# Patient Record
Sex: Female | Born: 1985 | Race: Black or African American | Hispanic: No | Marital: Single | Smoking: Never smoker
Health system: Southern US, Community
[De-identification: ages and names within clinical notes are randomized; demographics above are authoritative.]

## PROBLEM LIST (undated history)

## (undated) ENCOUNTER — Inpatient Hospital Stay (HOSPITAL_COMMUNITY): Payer: Self-pay

## (undated) DIAGNOSIS — L509 Urticaria, unspecified: Secondary | ICD-10-CM

## (undated) DIAGNOSIS — E559 Vitamin D deficiency, unspecified: Secondary | ICD-10-CM

## (undated) DIAGNOSIS — T783XXA Angioneurotic edema, initial encounter: Secondary | ICD-10-CM

## (undated) DIAGNOSIS — Z8742 Personal history of other diseases of the female genital tract: Secondary | ICD-10-CM

## (undated) DIAGNOSIS — E669 Obesity, unspecified: Secondary | ICD-10-CM

## (undated) DIAGNOSIS — I10 Essential (primary) hypertension: Secondary | ICD-10-CM

## (undated) HISTORY — DX: Urticaria, unspecified: L50.9

## (undated) HISTORY — PX: INDUCED ABORTION: SHX677

## (undated) HISTORY — DX: Obesity, unspecified: E66.9

## (undated) HISTORY — DX: Angioneurotic edema, initial encounter: T78.3XXA

## (undated) HISTORY — DX: Personal history of other diseases of the female genital tract: Z87.42

## (undated) HISTORY — DX: Vitamin D deficiency, unspecified: E55.9

## (undated) HISTORY — DX: Essential (primary) hypertension: I10

---
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
## 2012-01-17 ENCOUNTER — Telehealth: Payer: Self-pay | Admitting: Obstetrics and Gynecology

## 2012-01-17 NOTE — Telephone Encounter (Signed)
Pt called yesterday, states was put on BCPs back in October, has not had a cycle for the past 2 months, and yesterday started with heavy bldg, filled a pad in 20 minutes, 20 minutes later filled another pad, pt then took a 20 minute drive home and the bldg and slowed down some.  Pt was requesting an appt to address bldg and discuss a different contraception.  Informed pt will need consult with someone about appt bc only one provider in the office.  Spoke w/ pt today, states bldg is a lot better, was "freaked out yesterday about the heavy bldg".  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

## 2012-01-17 NOTE — Telephone Encounter (Signed)
Pt called yesterday, states was put on BCPs back in October, has not had a cycle for the past 2 months, and yesterday started with heavy bldg, filled a pad in 20 minutes, 20 minutes later

## 2012-01-17 NOTE — Telephone Encounter (Signed)
Triage/follow up quest.

## 2012-03-11 ENCOUNTER — Telehealth: Payer: Self-pay | Admitting: Obstetrics and Gynecology

## 2012-03-11 NOTE — Telephone Encounter (Signed)
Try calling pt rgd msg number not in service 

## 2012-03-31 ENCOUNTER — Ambulatory Visit (INDEPENDENT_AMBULATORY_CARE_PROVIDER_SITE_OTHER): Payer: BC Managed Care – PPO | Admitting: Obstetrics and Gynecology

## 2012-03-31 ENCOUNTER — Encounter: Payer: Self-pay | Admitting: Obstetrics and Gynecology

## 2012-03-31 VITALS — BP 124/80 | HR 72 | Resp 18 | Ht 68.0 in | Wt 279.0 lb

## 2012-03-31 DIAGNOSIS — IMO0001 Reserved for inherently not codable concepts without codable children: Secondary | ICD-10-CM

## 2012-03-31 DIAGNOSIS — Z139 Encounter for screening, unspecified: Secondary | ICD-10-CM

## 2012-03-31 DIAGNOSIS — N921 Excessive and frequent menstruation with irregular cycle: Secondary | ICD-10-CM

## 2012-03-31 DIAGNOSIS — Z309 Encounter for contraceptive management, unspecified: Secondary | ICD-10-CM

## 2012-03-31 DIAGNOSIS — Z124 Encounter for screening for malignant neoplasm of cervix: Secondary | ICD-10-CM

## 2012-03-31 DIAGNOSIS — Z01419 Encounter for gynecological examination (general) (routine) without abnormal findings: Secondary | ICD-10-CM

## 2012-03-31 LAB — CBC
HCT: 37.6 % (ref 36.0–46.0)
Hemoglobin: 12.5 g/dL (ref 12.0–15.0)
MCH: 28.8 pg (ref 26.0–34.0)
MCHC: 33.2 g/dL (ref 30.0–36.0)
WBC: 8.2 10*3/uL (ref 4.0–10.5)

## 2012-03-31 MED ORDER — NORETHIN-ETH ESTRADIOL-FE 0.4-35 MG-MCG PO CHEW: 1.0000 | CHEWABLE_TABLET | Freq: Every day | ORAL | Status: DC
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
## 2012-03-31 NOTE — Addendum Note (Signed)
Addended by: Marla Roe A on: 03/31/2012 05:32 PM   Modules accepted: Orders

## 2012-04-01 LAB — PAP IG W/ RFLX HPV ASCU

## 2012-04-01 LAB — VITAMIN D 25 HYDROXY (VIT D DEFICIENCY, FRACTURES): Vit D, 25-Hydroxy: 20 ng/mL — ABNORMAL LOW (ref 30–89)

## 2012-04-13 ENCOUNTER — Telehealth: Payer: Self-pay

## 2012-04-13 NOTE — Telephone Encounter (Signed)
[employer] a note asking her to send letter to pt to ask her to cb for test results. Phone for herself and emergency contact have been disconnected. Melody Comas A

## 2012-04-15 ENCOUNTER — Telehealth: Payer: Self-pay

## 2012-04-15 NOTE — Telephone Encounter (Signed)
Attempted again to reach pt re: abnl pap and Vit D levels. She has been sent Elliott letter, but has not contacted [name]. Brittany Elliott

## 2012-04-16 ENCOUNTER — Telehealth: Payer: Self-pay | Admitting: Obstetrics and Gynecology

## 2012-04-16 ENCOUNTER — Telehealth: Payer: Self-pay

## 2012-04-16 ENCOUNTER — Other Ambulatory Visit: Payer: Self-pay

## 2012-04-16 DIAGNOSIS — E559 Vitamin D deficiency, unspecified: Secondary | ICD-10-CM

## 2012-04-16 NOTE — Telephone Encounter (Signed)
LM x 2 for pt to cb re: test results. She has left a new mobile # of [phone]. She needs Vit. D protocol and colpo. I am looking at 06/01/2012 @ 9:30am. Melody Comas A

## 2012-04-16 NOTE — Telephone Encounter (Signed)
AR pt 

## 2012-04-16 NOTE — Telephone Encounter (Signed)
Spoke to pt about her labs. Colpo scheduled for 06/01/2012. W/ AR. [facility] D [facility] [address]. 50,000 units 1 po bi-weekly x 8 weeks #16 0 RF's . Recall entered and Future lab ordered. Melody Comas A

## 2012-04-22 ENCOUNTER — Telehealth: Payer: Self-pay

## 2012-04-27 ENCOUNTER — Telehealth: Payer: Self-pay

## 2012-04-27 NOTE — Telephone Encounter (Signed)
Spoke to pt who called for clarification of recent abnl pap. Brittany Elliott A

## 2012-06-01 ENCOUNTER — Encounter: Payer: BC Managed Care – PPO | Admitting: Obstetrics and Gynecology

## 2012-06-01 ENCOUNTER — Telehealth: Payer: Self-pay | Admitting: Obstetrics and Gynecology

## 2012-06-02 NOTE — Telephone Encounter (Signed)
AR pt 

## 2012-06-16 ENCOUNTER — Telehealth: Payer: Self-pay

## 2012-06-16 NOTE — Telephone Encounter (Signed)
Called pt back to get her R/S for Colpo. Brittany Elliott A

## 2012-07-06 DIAGNOSIS — Z8742 Personal history of other diseases of the female genital tract: Secondary | ICD-10-CM | POA: Insufficient documentation

## 2012-07-08 ENCOUNTER — Encounter: Payer: BC Managed Care – PPO | Admitting: Obstetrics and Gynecology

## 2012-07-24 ENCOUNTER — Telehealth: Payer: Self-pay
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
## 2012-08-04 NOTE — Telephone Encounter (Signed)
Note to close enc. Oakley, Jacqueline A  

## 2013-06-06 ENCOUNTER — Emergency Department (HOSPITAL_COMMUNITY)
Admission: EM | Admit: 2013-06-06 | Discharge: 2013-06-06 | Disposition: A | Discharge status: Home / Self Care | Payer: No Typology Code available for payment source | Attending: Emergency Medicine | Admitting: Emergency Medicine

## 2013-06-06 ENCOUNTER — Emergency Department (HOSPITAL_COMMUNITY): Payer: No Typology Code available for payment source

## 2013-06-06 ENCOUNTER — Encounter (HOSPITAL_COMMUNITY): Payer: Self-pay | Admitting: Emergency Medicine

## 2013-06-06 DIAGNOSIS — S46909A Unspecified injury of unspecified muscle, fascia and tendon at shoulder and upper arm level, unspecified arm, initial encounter: Secondary | ICD-10-CM | POA: Insufficient documentation

## 2013-06-06 DIAGNOSIS — E669 Obesity, unspecified: Secondary | ICD-10-CM | POA: Insufficient documentation

## 2013-06-06 DIAGNOSIS — IMO0002 Reserved for concepts with insufficient information to code with codable children: Secondary | ICD-10-CM | POA: Insufficient documentation

## 2013-06-06 DIAGNOSIS — Z8742 Personal history of other diseases of the female genital tract: Secondary | ICD-10-CM | POA: Insufficient documentation

## 2013-06-06 DIAGNOSIS — Y92410 Unspecified street and highway as the place of occurrence of the external cause: Secondary | ICD-10-CM | POA: Insufficient documentation

## 2013-06-06 DIAGNOSIS — Z79899 Other long term (current) drug therapy: Secondary | ICD-10-CM | POA: Insufficient documentation

## 2013-06-06 DIAGNOSIS — T148XXA Other injury of unspecified body region, initial encounter: Secondary | ICD-10-CM

## 2013-06-06 DIAGNOSIS — M542 Cervicalgia: Secondary | ICD-10-CM

## 2013-06-06 DIAGNOSIS — S4980XA Other specified injuries of shoulder and upper arm, unspecified arm, initial encounter: Secondary | ICD-10-CM | POA: Insufficient documentation

## 2013-06-06 DIAGNOSIS — Y9389 Activity, other specified: Secondary | ICD-10-CM | POA: Insufficient documentation

## 2013-06-06 MED ORDER — IBUPROFEN 800 MG PO TABS
800.0000 mg | ORAL_TABLET | Freq: Once | ORAL | Status: AC
  Administered 2013-06-06: 800 mg via ORAL
  Filled 2013-06-06: qty 4

## 2013-06-06 MED ORDER — OXYCODONE-ACETAMINOPHEN 5-325 MG PO TABS: 2.0000 | ORAL_TABLET | ORAL | Status: DC | PRN

## 2013-06-06 MED ORDER — IBUPROFEN 800 MG PO TABS: 800.0000 mg | ORAL_TABLET | Freq: Three times a day (TID) | ORAL | Status: DC

## 2013-06-06 MED ORDER — METHOCARBAMOL 750 MG PO TABS: 750.0000 mg | ORAL_TABLET | Freq: Four times a day (QID) | ORAL | Status: DC

## 2013-06-06 NOTE — ED Notes (Signed)
Pt states understanding of discharge instructions 
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
## 2014-05-09 ENCOUNTER — Inpatient Hospital Stay (HOSPITAL_COMMUNITY)
Admission: AD | Admit: 2014-05-09 | Discharge: 2014-05-10 | Disposition: A | Discharge status: Home / Self Care | Payer: BC Managed Care – PPO | Attending: Obstetrics and Gynecology | Admitting: Obstetrics and Gynecology

## 2014-05-09 ENCOUNTER — Encounter (HOSPITAL_COMMUNITY): Payer: Self-pay | Admitting: *Deleted

## 2014-05-09 ENCOUNTER — Inpatient Hospital Stay (HOSPITAL_COMMUNITY): Payer: BC Managed Care – PPO

## 2014-05-09 DIAGNOSIS — Z3A01 Less than 8 weeks gestation of pregnancy: Secondary | ICD-10-CM | POA: Insufficient documentation

## 2014-05-09 DIAGNOSIS — O468X1 Other antepartum hemorrhage, first trimester: Secondary | ICD-10-CM

## 2014-05-09 DIAGNOSIS — O23591 Infection of other part of genital tract in pregnancy, first trimester: Secondary | ICD-10-CM | POA: Insufficient documentation

## 2014-05-09 DIAGNOSIS — O209 Hemorrhage in early pregnancy, unspecified: Secondary | ICD-10-CM | POA: Insufficient documentation

## 2014-05-09 DIAGNOSIS — O26851 Spotting complicating pregnancy, first trimester: Secondary | ICD-10-CM | POA: Diagnosis present

## 2014-05-09 DIAGNOSIS — O98311 Other infections with a predominantly sexual mode of transmission complicating pregnancy, first trimester: Secondary | ICD-10-CM | POA: Diagnosis not present

## 2014-05-09 DIAGNOSIS — N760 Acute vaginitis: Secondary | ICD-10-CM | POA: Insufficient documentation

## 2014-05-09 DIAGNOSIS — A568 Sexually transmitted chlamydial infection of other sites: Secondary | ICD-10-CM | POA: Diagnosis not present

## 2014-05-09 DIAGNOSIS — B9689 Other specified bacterial agents as the cause of diseases classified elsewhere: Secondary | ICD-10-CM

## 2014-05-09 DIAGNOSIS — O98811 Other maternal infectious and parasitic diseases complicating pregnancy, first trimester: Secondary | ICD-10-CM

## 2014-05-09 DIAGNOSIS — O418X10 Other specified disorders of amniotic fluid and membranes, first trimester, not applicable or unspecified: Secondary | ICD-10-CM

## 2014-05-09 DIAGNOSIS — A749 Chlamydial infection, unspecified: Secondary | ICD-10-CM

## 2014-05-09 DIAGNOSIS — O161 Unspecified maternal hypertension, first trimester: Secondary | ICD-10-CM

## 2014-05-09 LAB — WET PREP, GENITAL
Trich, Wet Prep: NONE SEEN
YEAST WET PREP: NONE SEEN

## 2014-05-09 LAB — URINE MICROSCOPIC-ADD ON

## 2014-05-09 LAB — HCG, QUANTITATIVE, PREGNANCY: hCG, Beta Chain, Quant, S: 15774 m[IU]/mL — ABNORMAL HIGH (ref <5)

## 2014-05-09 LAB — URINALYSIS, ROUTINE W REFLEX MICROSCOPIC
BILIRUBIN URINE: NEGATIVE
Glucose, UA: NEGATIVE mg/dL
Hgb urine dipstick: NEGATIVE
Ketones, ur: NEGATIVE mg/dL
Nitrite: NEGATIVE
PROTEIN: NEGATIVE mg/dL
Specific Gravity, Urine: >1.03 — ABNORMAL HIGH (ref 1.005–1.030)
UROBILINOGEN UA: 0.2 mg/dL (ref 0.0–1.0)
pH: 6 (ref 5.0–8.0)

## 2014-05-09 LAB — POCT PREGNANCY, URINE: Preg Test, Ur: POSITIVE — AB
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
## 2014-05-09 NOTE — MAU Note (Signed)
Patient presents with +HPT and spotting. Denies pain.

## 2014-05-09 NOTE — MAU Provider Note (Signed)
History  28 yo G1P0 @ ~ [redacted]wks gestation presents unannounced to MAU w/ c/o spotting after wiping x 2 days. Reports +HPT this past Thursday; LMP reportedly the end of Oct. Reports burning during intercourse. Denies dysuria or blood in urine. Denies pain, N/V or fevers/chills.   Patient Active Problem List   Diagnosis Date Noted  . Chlamydia infection affecting pregnancy in first trimester, antepartum 05/12/2014  . BV (bacterial vaginosis) 05/09/2014  . Spotting affecting pregnancy in first trimester 05/09/2014  . Elevated blood pressure affecting pregnancy in first trimester, antepartum 05/09/2014  . Subchorionic hemorrhage in first trimester - small 05/09/2014  . Hx of metrorrhagia   . Metrorrhagia 03/31/2012    Chief Complaint  Patient presents with  . Vaginal Bleeding   HPI As above OB History    Gravida Para Term Preterm AB TAB SAB Ectopic Multiple Living   1 0              Past Medical History  Diagnosis Date  . Obese   . Hx of metrorrhagia     History reviewed. No pertinent past surgical history.  Family History  Problem Relation Age of Onset  . Hypertension Mother   . Diabetes Mother   . Kidney disease Mother     History  Substance Use Topics  . Smoking status: Never Smoker   . Smokeless tobacco: Not on file  . Alcohol Use: No    Allergies: No Known Allergies  No prescriptions prior to admission    ROS  Spotting  Physical Exam   Blood pressure 110/95, pulse 68, temperature 99.7 F (37.6 C), temperature source Oral, resp. rate 18, height 5\' 8"  (1.727 m), weight 292 lb 6 oz (132.62 kg), last menstrual period 03/20/2014, SpO2 100 %.  Filed Vitals:   05/10/14 0041  BP: 110/95  Pulse: 68  Temp:   Resp:    Recent Results (from the past 2160 hour(s))  Urinalysis, Routine w reflex microscopic     Status: Abnormal   Collection Time: 05/09/14  9:01 PM  Result Value Ref Range   Color, Urine YELLOW YELLOW   APPearance CLEAR CLEAR   Specific Gravity,  Urine >1.030 (H) 1.005 - 1.030   pH 6.0 5.0 - 8.0   Glucose, UA NEGATIVE NEGATIVE mg/dL   Hgb urine dipstick NEGATIVE NEGATIVE   Bilirubin Urine NEGATIVE NEGATIVE   Ketones, ur NEGATIVE NEGATIVE mg/dL   Protein, ur NEGATIVE NEGATIVE mg/dL   Urobilinogen, UA 0.2 0.0 - 1.0 mg/dL   Nitrite NEGATIVE NEGATIVE   Leukocytes, UA TRACE (A) NEGATIVE  Urine microscopic-add on     Status: Abnormal   Collection Time: 05/09/14  9:01 PM  Result Value Ref Range   Squamous Epithelial / LPF MANY (A) RARE   WBC, UA 3-6 <3 WBC/hpf   Bacteria, UA MANY (A) RARE   Urine-Other MUCOUS PRESENT   Pregnancy, urine POC     Status: Abnormal   Collection Time: 05/09/14  9:05 PM  Result Value Ref Range   Preg Test, Ur POSITIVE (A) NEGATIVE    Comment:        THE SENSITIVITY OF THIS METHODOLOGY IS >24 mIU/mL   hCG, quantitative, pregnancy     Status: Abnormal   Collection Time: 05/09/14  9:55 PM  Result Value Ref Range   hCG, Beta Chain, Quant, S 15774 (H) <5 mIU/mL    Comment:          GEST. AGE      CONC.  (mIU/mL)   <=  1 WEEK        5 - 50     2 WEEKS       50 - 500     3 WEEKS       100 - 10,000     4 WEEKS     1,000 - 30,000     5 WEEKS     3,500 - 115,000   6-8 WEEKS     12,000 - 270,000    12 WEEKS     15,000 - 220,000        FEMALE AND NON-PREGNANT FEMALE:     LESS THAN 5 mIU/mL   GC/Chlamydia Probe Amp (multiple spec sources)     Status: Abnormal   Collection Time: 05/09/14 11:20 PM  Result Value Ref Range   CT Probe RNA POSITIVE (A) NEGATIVE    Comment: (NOTE) A Positive CT or NG Nucleic Acid Amplification Test (NAAT) result should be considered presumptive evidence of infection.  The result should be evaluated along with physical examination and other diagnostic findings.    GC Probe RNA NEGATIVE NEGATIVE    Comment: (NOTE)                                                                                       **Normal Reference Range: Negative**      Assay performed using the Gen-Probe  APTIMA COMBO2 (R) Assay. Acceptable specimen types for this assay include APTIMA Swabs (Unisex, endocervical, urethral, or vaginal), first void urine, and ThinPrep liquid based cytology samples. Performed at [facility]   Wet prep, genital     Status: Abnormal   Collection Time: 05/09/14 11:20 PM  Result Value Ref Range   Yeast Wet Prep HPF POC NONE SEEN NONE SEEN   Trich, Wet Prep NONE SEEN NONE SEEN   Clue Cells Wet Prep HPF POC FEW (A) NONE SEEN   WBC, Wet Prep HPF POC FEW (A) NONE SEEN    Comment: MODERATE BACTERIA SEEN  CLINICAL DATA: Spotting effecting pregnancy in first trimester.  EXAM: OBSTETRIC <14 WK US AND TRANSVAGINAL OB US  TECHNIQUE: Both transabdominal and transvaginal ultrasound examinations were performed for complete evaluation of the gestation as well as the maternal uterus, adnexal regions, and pelvic cul-de-sac. Transvaginal technique was performed to assess early pregnancy.  COMPARISON: None.  FINDINGS: Intrauterine gestational sac: Visualized/normal in shape.  Yolk sac: Visualized.  Embryo: Visualized.  Cardiac Activity: Visualized.  Heart Rate: 126 bpm  CRL: 6.9 mm 6 w 4 d US EDC: December 29, 2014.  Maternal uterus/adnexae: Small subchorionic hemorrhage is noted. Ovaries appear normal except for corpus luteum cyst in right ovary. No free fluid is noted.  IMPRESSION: Single live intrauterine gestation of 6 weeks 4 days. Small subchorionic hemorrhage is noted.   Electronically Signed  By: Roque LiasJames Green M.D.  On: 05/10/2014 02:02   Physical Exam Gen: Anxious Abdomen: soft, NT, no guarding or rebound tenderness Neg CVAT bilaterally Pelvic: Normal external genitalia. Vagina w/o lesions. +whiff. Sample obtained for wet prep and GC/CT. No blood in vault, but bleeding noted from cervix, became hemostatic w large swab.  ED Course  UA UPT BHCG  Wet prep GC/CT U/S Assessment: BV.  Clinically stable.  [facility]  Plan: D/C home.  Clindamycin po tabs. Precautions given.  Pelvic rest. Return to MAU as needed. Office f/u for RN interview.   Sherre ScarletWILLIAMS, Jennene Downie CNM, MS 05/09/14, 11:23 PM   ADDENDUM: Results positive for Chlamydia. Will have office contact pt and partner for treatment.  Sherre ScarletKimberly Kieren Ricci, CNM  05/12/14, 10:00 AM

## 2014-05-10 LAB — GC/CHLAMYDIA PROBE AMP
CT PROBE, AMP APTIMA: POSITIVE — AB
GC PROBE AMP APTIMA: NEGATIVE

## 2014-05-10 MED ORDER — CLINDAMYCIN HCL 300 MG PO CAPS: 300.0000 mg | ORAL_CAPSULE | Freq: Two times a day (BID) | ORAL | Status: DC

## 2014-05-10 NOTE — Discharge Instructions (Signed)
Vaginal Bleeding During Pregnancy, First Trimester °A small amount of bleeding (spotting) from the vagina is relatively common in early pregnancy. It usually stops on its own. Various things may cause bleeding or spotting in early pregnancy. Some bleeding may be related to the pregnancy, and some may not. In most cases, the bleeding is normal and is not a problem. However, bleeding can also be a sign of something serious. Be sure to tell your health care provider about any vaginal bleeding right away. °Some possible causes of vaginal bleeding during the first trimester include: °· Infection or inflammation of the cervix. °· Growths (polyps) on the cervix. °· Miscarriage or threatened miscarriage. °· Pregnancy tissue has developed outside of the uterus and in a fallopian tube (tubal pregnancy). °· Tiny cysts have developed in the uterus instead of pregnancy tissue (molar pregnancy). °HOME CARE INSTRUCTIONS  °Watch your condition for any changes. The following actions may help to lessen any discomfort you are feeling: °· Follow your health care provider's instructions for limiting your activity. If your health care provider orders bed rest, you may need to stay in bed and only get up to use the bathroom. However, your health care provider may allow you to continue light activity. °· If needed, make plans for someone to help with your regular activities and responsibilities while you are on bed rest. °· Keep track of the number of pads you use each day, how often you change pads, and how soaked (saturated) they are. Write this down. °· Do not use tampons. Do not douche. °· Do not have sexual intercourse or orgasms until approved by your health care provider. °· If you pass any tissue from your vagina, save the tissue so you can show it to your health care provider. °· Only take over-the-counter or prescription medicines as directed by your health care provider. °· Do not take aspirin because it can make you  bleed. °· Keep all follow-up appointments as directed by your health care provider. °SEEK MEDICAL CARE IF: °· You have any vaginal bleeding during any part of your pregnancy. °· You have cramps or labor pains. °· You have a fever, not controlled by medicine. °SEEK IMMEDIATE MEDICAL CARE IF:  °· You have severe cramps in your back or belly (abdomen). °· You pass large clots or tissue from your vagina. °· Your bleeding increases. °· You feel light-headed or weak, or you have fainting episodes. °· You have chills. °· You are leaking fluid or have a gush of fluid from your vagina. °· You pass out while having a bowel movement. °MAKE SURE YOU: °· Understand these instructions. °· Will watch your condition. °· Will get help right away if you are not doing well or get worse. °Document Released: 02/20/2005 Document Revised: 05/18/2013 Document Reviewed: 01/18/2013 °ExitCare® Patient Information ©2015 [employer]. This information is not intended to replace advice given to you by your health care provider. Make sure you discuss any questions you have with your health care provider. °Bacterial Vaginosis °Bacterial vaginosis is a vaginal infection that occurs when the normal balance of bacteria in the vagina is disrupted. It results from an overgrowth of certain bacteria. This is the most common vaginal infection in women of childbearing age. Treatment is important to prevent complications, especially in pregnant women, as it can cause a premature delivery. °CAUSES  °Bacterial vaginosis is caused by an increase in harmful bacteria that are normally present in smaller amounts in the vagina. Several different kinds of bacteria can   cause bacterial vaginosis. However, the reason that the condition develops is not fully understood. RISK FACTORS Certain activities or behaviors can put you at an increased risk of developing bacterial vaginosis, including:  Having a new sex partner or multiple sex partners.  Douching.  Using  an intrauterine device (IUD) for contraception. Women do not get bacterial vaginosis from toilet seats, bedding, swimming pools, or contact with objects around them. SIGNS AND SYMPTOMS  Some women with bacterial vaginosis have no signs or symptoms. Common symptoms include:  Grey vaginal discharge.  A fishlike odor with discharge, especially after sexual intercourse.  Itching or burning of the vagina and vulva.  Burning or pain with urination. DIAGNOSIS  Your health care provider will take a medical history and examine the vagina for signs of bacterial vaginosis. A sample of vaginal fluid may be taken. Your health care provider will look at this sample under a microscope to check for bacteria and abnormal cells. A vaginal pH test may also be done.  TREATMENT  Bacterial vaginosis may be treated with antibiotic medicines. These may be given in the form of a pill or a vaginal cream. A second round of antibiotics may be prescribed if the condition comes back after treatment.  HOME CARE INSTRUCTIONS   Only take over-the-counter or prescription medicines as directed by your health care provider.  If antibiotic medicine was prescribed, take it as directed. Make sure you finish it even if you start to feel better.  Do not have sex until treatment is completed.  Tell all sexual partners that you have a vaginal infection. They should see their health care provider and be treated if they have problems, such as a mild rash or itching.  Practice safe sex by using condoms and only having one sex partner. SEEK MEDICAL CARE IF:   Your symptoms are not improving after 3 days of treatment.  You have increased discharge or pain.  You have a fever. MAKE SURE YOU:   Understand these instructions.  Will watch your condition.  Will get help right away if you are not doing well or get worse. FOR MORE INFORMATION  Centers for Disease Control and Prevention, Division of STD Prevention:  SolutionApps.co.zawww.cdc.gov/std American Sexual Health Association (ASHA): www.ashastd.org  Document Released: 05/13/2005 Document Revised: 03/03/2013 Document Reviewed: 12/23/2012 [facility] Patient Information 2015 [place]. This information is not intended to replace advice given to you by your health care provider. Make sure you discuss any questions you have with your health care provider.

## 2014-05-12 DIAGNOSIS — O98811 Other maternal infectious and parasitic diseases complicating pregnancy, first trimester: Secondary | ICD-10-CM

## 2014-05-12 DIAGNOSIS — A749 Chlamydial infection, unspecified: Secondary | ICD-10-CM

## 2014-12-28 ENCOUNTER — Ambulatory Visit
Admission: RE | Admit: 2014-12-28 | Discharge: 2014-12-28 | Disposition: A | Discharge status: Home / Self Care | Payer: BC Managed Care – PPO | Source: Ambulatory Visit | Attending: Family Medicine | Admitting: Family Medicine

## 2014-12-28 ENCOUNTER — Other Ambulatory Visit: Payer: Self-pay | Admitting: Family Medicine

## 2014-12-28 DIAGNOSIS — M545 Low back pain: Secondary | ICD-10-CM

## 2015-11-13 ENCOUNTER — Inpatient Hospital Stay (HOSPITAL_COMMUNITY): Payer: BC Managed Care – PPO

## 2015-11-13 ENCOUNTER — Inpatient Hospital Stay (HOSPITAL_COMMUNITY)
Admission: AD | Admit: 2015-11-13 | Discharge: 2015-11-13 | Disposition: A | Discharge status: Home / Self Care | Payer: BC Managed Care – PPO | Source: Ambulatory Visit | Attending: Obstetrics and Gynecology | Admitting: Obstetrics and Gynecology

## 2015-11-13 ENCOUNTER — Encounter (HOSPITAL_COMMUNITY): Payer: Self-pay | Admitting: *Deleted

## 2015-11-13 DIAGNOSIS — O23591 Infection of other part of genital tract in pregnancy, first trimester: Secondary | ICD-10-CM | POA: Insufficient documentation

## 2015-11-13 DIAGNOSIS — Z3A01 Less than 8 weeks gestation of pregnancy: Secondary | ICD-10-CM | POA: Insufficient documentation

## 2015-11-13 DIAGNOSIS — O209 Hemorrhage in early pregnancy, unspecified: Secondary | ICD-10-CM | POA: Diagnosis present

## 2015-11-13 DIAGNOSIS — O4691 Antepartum hemorrhage, unspecified, first trimester: Secondary | ICD-10-CM

## 2015-11-13 DIAGNOSIS — N760 Acute vaginitis: Secondary | ICD-10-CM | POA: Diagnosis not present

## 2015-11-13 DIAGNOSIS — B9689 Other specified bacterial agents as the cause of diseases classified elsewhere: Secondary | ICD-10-CM | POA: Diagnosis not present

## 2015-11-13 DIAGNOSIS — N888 Other specified noninflammatory disorders of cervix uteri: Secondary | ICD-10-CM

## 2015-11-13 LAB — WET PREP, GENITAL
Sperm: NONE SEEN
Trich, Wet Prep: NONE SEEN
YEAST WET PREP: NONE SEEN

## 2015-11-13 LAB — URINALYSIS, ROUTINE W REFLEX MICROSCOPIC
Bilirubin Urine: NEGATIVE
GLUCOSE, UA: NEGATIVE mg/dL
Hgb urine dipstick: NEGATIVE
KETONES UR: NEGATIVE mg/dL
LEUKOCYTES UA: NEGATIVE
NITRITE: NEGATIVE
PH: 6 (ref 5.0–8.0)
Protein, ur: NEGATIVE mg/dL
SPECIFIC GRAVITY, URINE: 1.025 (ref 1.005–1.030)

## 2015-11-13 LAB — CBC
HEMATOCRIT: 36.4 % (ref 36.0–46.0)
HEMOGLOBIN: 12 g/dL (ref 12.0–15.0)
MCH: 29.1 pg (ref 26.0–34.0)
MCHC: 33 g/dL (ref 30.0–36.0)
MCV: 88.3 fL (ref 78.0–100.0)
PLATELETS: 314 10*3/uL (ref 150–400)
RBC: 4.12 MIL/uL (ref 3.87–5.11)
RDW: 13.3 % (ref 11.5–15.5)
WBC: 8.4 10*3/uL (ref 4.0–10.5)

## 2015-11-13 LAB — ABO/RH: ABO/RH(D): A POS

## 2015-11-13 LAB — POCT PREGNANCY, URINE: Preg Test, Ur: POSITIVE — AB

## 2015-11-13 LAB — HCG, QUANTITATIVE, PREGNANCY: hCG, Beta Chain, Quant, S: 19323 m[IU]/mL — ABNORMAL HIGH (ref <5)

## 2015-11-13 MED ORDER — METRONIDAZOLE 500 MG PO TABS: 500.0000 mg | ORAL_TABLET | Freq: Two times a day (BID) | ORAL | Status: DC

## 2015-11-13 NOTE — MAU Provider Note (Signed)
History     CSN: [id number]  Arrival date and time: 11/13/15 1416   First Provider Initiated Contact with Patient 11/13/15 1535      Chief Complaint  Patient presents with  . Vaginal Bleeding   HPI   [name] is a 30 y.o. female G2P0010 @ 6570w0d presenting to MAU with vaginal bleeding.  She had a home pregnancy test that was positive last week.  She started having spotting this morning; the spotting is noticed only when she wipes. The bleeding is light red in color.   She denies pain at this time.  [place] [facility] [place] .   No recent intercourse.    OB History    Gravida Para Term Preterm AB TAB SAB Ectopic Multiple Living   2 0   1           Past Medical History  Diagnosis Date  . Obese   . Hx of metrorrhagia     Past Surgical History  Procedure Laterality Date  . Induced abortion      Family History  Problem Relation Age of Onset  . Hypertension Mother   . Diabetes Mother   . Kidney disease Mother     Social History  Substance Use Topics  . Smoking status: Never Smoker   . Smokeless tobacco: None  . Alcohol Use: Yes     Comment: occasional    Allergies: No Known Allergies  No prescriptions prior to admission   Results for orders placed or performed during the hospital encounter of 11/13/15 (from the past 48 hour(s))  Urinalysis, Routine w reflex microscopic ([facility])     Status: None   Collection Time: 11/13/15  2:35 PM  Result Value Ref Range   Color, Urine YELLOW YELLOW   APPearance CLEAR CLEAR   Specific Gravity, Urine 1.025 1.005 - 1.030   pH 6.0 5.0 - 8.0   Glucose, UA NEGATIVE NEGATIVE mg/dL   Hgb urine dipstick NEGATIVE NEGATIVE   Bilirubin Urine NEGATIVE NEGATIVE   Ketones, ur NEGATIVE NEGATIVE mg/dL   Protein, ur NEGATIVE NEGATIVE mg/dL   Nitrite NEGATIVE NEGATIVE   Leukocytes, UA NEGATIVE NEGATIVE    Comment: MICROSCOPIC NOT DONE ON URINES WITH NEGATIVE PROTEIN, BLOOD, LEUKOCYTES,  NITRITE, OR GLUCOSE <1000 mg/dL.  Pregnancy, urine POC     Status: Abnormal   Collection Time: 11/13/15  2:44 PM  Result Value Ref Range   Preg Test, Ur POSITIVE (A) NEGATIVE    Comment:        THE SENSITIVITY OF THIS METHODOLOGY IS >24 mIU/mL   Wet prep, genital     Status: Abnormal   Collection Time: 11/13/15  3:50 PM  Result Value Ref Range   Yeast Wet Prep HPF POC NONE SEEN NONE SEEN   Trich, Wet Prep NONE SEEN NONE SEEN   Clue Cells Wet Prep HPF POC PRESENT (A) NONE SEEN   WBC, Wet Prep HPF POC FEW (A) NONE SEEN    Comment: MODERATE BACTERIA SEEN   Sperm NONE SEEN   CBC     Status: None   Collection Time: 11/13/15  4:01 PM  Result Value Ref Range   WBC 8.4 4.0 - 10.5 K/uL   RBC 4.12 3.87 - 5.11 MIL/uL   Hemoglobin 12.0 12.0 - 15.0 g/dL   HCT 27.236.4 53.636.0 - 64.446.0 %   MCV 88.3 78.0 - 100.0 fL   MCH 29.1 26.0 - 34.0 pg   MCHC 33.0  30.0 - 36.0 g/dL   RDW 40.9 81.1 - 91.4 %   Platelets 314 150 - 400 K/uL  ABO/Rh     Status: None (Preliminary result)   Collection Time: 11/13/15  4:01 PM  Result Value Ref Range   ABO/RH(D) A POS   hCG, quantitative, pregnancy     Status: Abnormal   Collection Time: 11/13/15  4:01 PM  Result Value Ref Range   hCG, Beta Chain, Quant, S 19323 (H) <5 mIU/mL    Comment:          GEST. AGE      CONC.  (mIU/mL)   <=1 WEEK        5 - 50     2 WEEKS       50 - 500     3 WEEKS       100 - 10,000     4 WEEKS     1,000 - 30,000     5 WEEKS     3,500 - 115,000   6-8 WEEKS     12,000 - 270,000    12 WEEKS     15,000 - 220,000        FEMALE AND NON-PREGNANT FEMALE:     LESS THAN 5 mIU/mL    US Ob Comp Less 14 Wks  11/13/2015  CLINICAL DATA:  Onset of vaginal bleeding today. EXAM: OBSTETRIC <14 WK [name] AND TRANSVAGINAL OB US TECHNIQUE: Both transabdominal and transvaginal ultrasound examinations were performed for complete evaluation of the gestation as well as the maternal uterus, adnexal regions, and pelvic cul-de-sac. Transvaginal technique was  performed to assess early pregnancy. COMPARISON:  None. FINDINGS: Intrauterine gestational sac: Single Yolk sac:  Yes Embryo:  Yes Cardiac Activity: Yes Heart Rate: 99  bpm MSD: 1.41 cm 6 w   2  d CRL:  too small to accurately measure [name] EDC: Too early to estimate Subchorionic hemorrhage:  None visualized. Maternal uterus/adnexae: Normal.  No free fluid. IMPRESSION: Intrauterine pregnancy of approximately 6 weeks 2 days gestation. No complicating features. Electronically Signed   By: Francene Boyers M.D.   On: 11/13/2015 17:59   US Ob Transvaginal  11/13/2015  CLINICAL DATA:  Onset of vaginal bleeding today. EXAM: OBSTETRIC <14 WK [name] AND TRANSVAGINAL OB US TECHNIQUE: Both transabdominal and transvaginal ultrasound examinations were performed for complete evaluation of the gestation as well as the maternal uterus, adnexal regions, and pelvic cul-de-sac. Transvaginal technique was performed to assess early pregnancy. COMPARISON:  None. FINDINGS: Intrauterine gestational sac: Single Yolk sac:  Yes Embryo:  Yes Cardiac Activity: Yes Heart Rate: 99  bpm MSD: 1.41 cm 6 w   2  d CRL:  too small to accurately measure [name] EDC: Too early to estimate Subchorionic hemorrhage:  None visualized. Maternal uterus/adnexae: Normal.  No free fluid. IMPRESSION: Intrauterine pregnancy of approximately 6 weeks 2 days gestation. No complicating features. Electronically Signed   By: Francene Boyers M.D.   On: 11/13/2015 17:59    Review of Systems  Constitutional: Negative for fever and chills.  Gastrointestinal: Negative for nausea, vomiting and abdominal pain.   Physical Exam   Blood pressure 142/81, pulse 73, temperature 99.9 F (37.7 C), temperature source Rectal, resp. rate 16, height  (1.727 m), weight 290 lb 12.8 oz (131.906 kg), last menstrual period 10/02/2015, SpO2 98 %, unknown if currently breastfeeding.  Physical Exam  Constitutional: She is oriented to person, place, and time. She appears well-developed and  well-nourished. No distress.  HENT:  Head: Normocephalic.  Eyes: Pupils are equal, round, and reactive to light.  Neck: Normal range of motion.  Respiratory: Effort normal.  GI: Soft.  Genitourinary:  Speculum exam: Vagina - Small amount of creamy discharge, no odor. No blood noted in vaginal canal  Cervix - + contact bleeding when cervix swabbed  Bimanual exam: Cervix closed, no CMT  Uterus non tender, normal size Adnexa non tender, no masses bilaterally GC/Chlam, wet prep done Chaperone present for exam.  Musculoskeletal: Normal range of motion.  Neurological: She is alert and oriented to person, place, and time.  Skin: Skin is warm. She is not diaphoretic.  Psychiatric: Her behavior is normal.    MAU Course  Procedures  None  MDM  A positive blood type  Discussed patient with Dr. Su Hilt   Assessment and Plan   A:  1. Friable cervix   2. Vaginal bleeding in pregnancy, first trimester   3. BV (bacterial vaginosis)     P:  Discharge home in stable condition Bleeding precautions Return to MAU if symptoms worsen Rx: Flagyl  Follow up with [facility] as scheduled   Duane Lope, NP 11/15/2015 11:16 AM

## 2015-11-13 NOTE — Discharge Instructions (Signed)
Bacterial Vaginosis Bacterial vaginosis is a vaginal infection that occurs when the normal balance of bacteria in the vagina is disrupted. It results from an overgrowth of certain bacteria. This is the most common vaginal infection in women of childbearing age. Treatment is important to prevent complications, especially in pregnant women, as it can cause a premature delivery. CAUSES  Bacterial vaginosis is caused by an increase in harmful bacteria that are normally present in smaller amounts in the vagina. Several different kinds of bacteria can cause bacterial vaginosis. However, the reason that the condition develops is not fully understood. RISK FACTORS Certain activities or behaviors can put you at an increased risk of developing bacterial vaginosis, including:  Having a new sex partner or multiple sex partners.  Douching.  Using an intrauterine device (IUD) for contraception. Women do not get bacterial vaginosis from toilet seats, bedding, swimming pools, or contact with objects around them. SIGNS AND SYMPTOMS  Some women with bacterial vaginosis have no signs or symptoms. Common symptoms include:  Grey vaginal discharge.  A fishlike odor with discharge, especially after sexual intercourse.  Itching or burning of the vagina and vulva.  Burning or pain with urination. DIAGNOSIS  Your health care provider will take a medical history and examine the vagina for signs of bacterial vaginosis. A sample of vaginal fluid may be taken. Your health care provider will look at this sample under a microscope to check for bacteria and abnormal cells. A vaginal pH test may also be done.  TREATMENT  Bacterial vaginosis may be treated with antibiotic medicines. These may be given in the form of a pill or a vaginal cream. A second round of antibiotics may be prescribed if the condition comes back after treatment. Because bacterial vaginosis increases your risk for sexually transmitted diseases, getting  treated can help reduce your risk for chlamydia, gonorrhea, HIV, and herpes. HOME CARE INSTRUCTIONS   Only take over-the-counter or prescription medicines as directed by your health care provider.  If antibiotic medicine was prescribed, take it as directed. Make sure you finish it even if you start to feel better.  Tell all sexual partners that you have a vaginal infection. They should see their health care provider and be treated if they have problems, such as a mild rash or itching.  During treatment, it is important that you follow these instructions:  Avoid sexual activity or use condoms correctly.  Do not douche.  Avoid alcohol as directed by your health care provider.  Avoid breastfeeding as directed by your health care provider. SEEK MEDICAL CARE IF:   Your symptoms are not improving after 3 days of treatment.  You have increased discharge or pain.  You have a fever. MAKE SURE YOU:   Understand these instructions.  Will watch your condition.  Will get help right away if you are not doing well or get worse. FOR MORE INFORMATION  Centers for Disease Control and Prevention, Division of STD Prevention: SolutionApps.co.zawww.cdc.gov/std American Sexual Health Association (ASHA): www.ashastd.org    This information is not intended to replace advice given to you by your health care provider. Make sure you discuss any questions you have with your health care provider.   Document Released: 05/13/2005 Document Revised: 06/03/2014 Document Reviewed: 12/23/2012 Elsevier Interactive Patient Education 2016 Elsevier Inc. Pelvic Rest Pelvic rest is sometimes recommended for women when:   The placenta is partially or completely covering the opening of the cervix (placenta previa).  There is bleeding between the uterine wall and the amniotic  sac in the first trimester (subchorionic hemorrhage).  The cervix begins to open without labor starting (incompetent cervix, cervical insufficiency).  The  labor is too early (preterm labor). HOME CARE INSTRUCTIONS  Do not have sexual intercourse, stimulation, or an orgasm.  Do not use tampons, douche, or put anything in the vagina.  Do not lift anything over 10 pounds (4.5 kg).  Avoid strenuous activity or straining your pelvic muscles. SEEK MEDICAL CARE IF:  You have any vaginal bleeding during pregnancy. Treat this as a potential emergency.  You have cramping pain felt low in the stomach (stronger than menstrual cramps).  You notice vaginal discharge (watery, mucus, or bloody).  You have a low, dull backache.  There are regular contractions or uterine tightening. SEEK IMMEDIATE MEDICAL CARE IF: You have vaginal bleeding and have placenta previa.    This information is not intended to replace advice given to you by your health care provider. Make sure you discuss any questions you have with your health care provider.   Document Released: 09/07/2010 Document Revised: 08/05/2011 Document Reviewed: 11/14/2014 Elsevier Interactive Patient Education [employer]2016 Elsevier Inc.

## 2015-11-13 NOTE — MAU Note (Signed)
Patient states she took a +HPT on Thursday and noticed some spotting today when using the bathroom.  Denies any pain.  Has not had confirmation appointment.  LMP 10/01/15.

## 2015-11-14 LAB — GC/CHLAMYDIA PROBE AMP (~~LOC~~) NOT AT ARMC
Chlamydia: NEGATIVE
NEISSERIA GONORRHEA: NEGATIVE

## 2015-11-14 LAB — HIV ANTIBODY (ROUTINE TESTING W REFLEX): HIV Screen 4th Generation wRfx: NONREACTIVE

## 2016-05-28 ENCOUNTER — Other Ambulatory Visit: Payer: Self-pay | Admitting: Obstetrics and Gynecology

## 2016-09-17 ENCOUNTER — Encounter (HOSPITAL_COMMUNITY): Payer: Self-pay

## 2016-12-11 ENCOUNTER — Encounter (HOSPITAL_COMMUNITY): Payer: Self-pay | Admitting: Emergency Medicine

## 2016-12-11 ENCOUNTER — Emergency Department (HOSPITAL_COMMUNITY)
Admission: EM | Admit: 2016-12-11 | Discharge: 2016-12-11 | Disposition: A | Discharge status: Home / Self Care | Payer: BC Managed Care – PPO | Attending: Emergency Medicine | Admitting: Emergency Medicine

## 2016-12-11 DIAGNOSIS — R21 Rash and other nonspecific skin eruption: Secondary | ICD-10-CM | POA: Diagnosis present

## 2016-12-11 DIAGNOSIS — R600 Localized edema: Secondary | ICD-10-CM | POA: Diagnosis not present

## 2016-12-11 DIAGNOSIS — Y929 Unspecified place or not applicable: Secondary | ICD-10-CM | POA: Diagnosis not present

## 2016-12-11 DIAGNOSIS — Y999 Unspecified external cause status: Secondary | ICD-10-CM | POA: Diagnosis not present

## 2016-12-11 DIAGNOSIS — Y9389 Activity, other specified: Secondary | ICD-10-CM | POA: Diagnosis not present

## 2016-12-11 DIAGNOSIS — T783XXA Angioneurotic edema, initial encounter: Secondary | ICD-10-CM | POA: Diagnosis not present

## 2016-12-11 DIAGNOSIS — T7840XA Allergy, unspecified, initial encounter: Secondary | ICD-10-CM | POA: Diagnosis not present

## 2016-12-11 MED ORDER — RANITIDINE HCL 150 MG/10ML PO SYRP
150.0000 mg | ORAL_SOLUTION | Freq: Once | ORAL | Status: AC
  Administered 2016-12-11: 150 mg via ORAL
  Filled 2016-12-11: qty 10

## 2016-12-11 MED ORDER — PREDNISONE 20 MG PO TABS
60.0000 mg | ORAL_TABLET | Freq: Once | ORAL | Status: AC
  Administered 2016-12-11: 60 mg via ORAL
  Filled 2016-12-11: qty 3

## 2016-12-11 MED ORDER — PREDNISONE 20 MG PO TABS: ORAL_TABLET | ORAL | 0 refills | Status: DC

## 2016-12-11 MED ORDER — RANITIDINE HCL 150 MG PO TABS: 150.0000 mg | ORAL_TABLET | Freq: Two times a day (BID) | ORAL | 0 refills | Status: DC

## 2016-12-11 MED ORDER — DIPHENHYDRAMINE HCL 25 MG PO CAPS
50.0000 mg | ORAL_CAPSULE | Freq: Once | ORAL | Status: AC
  Administered 2016-12-11: 50 mg via ORAL
  Filled 2016-12-11: qty 2

## 2016-12-11 MED ORDER — DIPHENHYDRAMINE HCL 25 MG PO CAPS: 25.0000 mg | ORAL_CAPSULE | Freq: Four times a day (QID) | ORAL | 0 refills | Status: DC | PRN

## 2016-12-11 NOTE — ED Provider Notes (Signed)
[facility]-EMERGENCY DEPT Provider Note   CSN: [id number] Arrival date & time: 12/11/16  [id number]     History   Chief Complaint Chief Complaint  Patient presents with  . Allergic Reaction    HPI Brittany Elliott is a 31 y.o. female.   Allergic Reaction  Presenting symptoms: itching, rash and swelling (upper lip)   Severity:  Mild Prior allergic episodes:  No prior episodes Ineffective treatments:  Antihistamines   Past Medical History:  Diagnosis Date  . Hx of metrorrhagia   . Obese     Patient Active Problem List   Diagnosis Date Noted  . Chlamydia infection affecting pregnancy in first trimester, antepartum 05/12/2014  . BV (bacterial vaginosis) 05/09/2014  . Spotting affecting pregnancy in first trimester 05/09/2014  . Elevated blood pressure affecting pregnancy in first trimester, antepartum 05/09/2014  . Subchorionic hemorrhage in first trimester - small 05/09/2014  . Hx of metrorrhagia   . Metrorrhagia 03/31/2012    Past Surgical History:  Procedure Laterality Date  . INDUCED ABORTION      OB History    Gravida Para Term Preterm AB Living   2 0     1     SAB TAB Ectopic Multiple Live Births                   Home Medications    Prior to Admission medications   Medication Sig Start Date End Date Taking? Authorizing Provider  Cholecalciferol (VITAMIN D3) 50000 units CAPS Take 50,000 Units by mouth once a week. Sundays 11/29/16  Yes [provider]  diphenhydrAMINE (BENADRYL) 25 mg capsule Take 1 capsule (25 mg total) by mouth every 6 (six) hours as needed. 12/11/16   Tyrone Balash, Barbara CowerJason, MD  predniSONE (DELTASONE) 20 MG tablet 2 tabs po daily x 4 days 12/11/16   Ayomide Zuleta, Barbara CowerJason, MD  ranitidine (ZANTAC) 150 MG tablet Take 1 tablet (150 mg total) by mouth 2 (two) times daily. 12/11/16   Katy Brickell, Barbara CowerJason, MD    Family History Family History  Problem Relation Age of Onset  . Hypertension Mother   . Diabetes Mother   . Kidney disease Mother     Social  History Social History  Substance Use Topics  . Smoking status: Never Smoker  . Smokeless tobacco: Never Used  . Alcohol use Yes     Comment: occasional     Allergies   Patient has no known allergies.   Review of Systems Review of Systems  Respiratory: Negative for shortness of breath.   Gastrointestinal: Negative for abdominal pain, diarrhea, nausea and vomiting.  Skin: Positive for itching and rash.  All other systems reviewed and are negative.    Physical Exam Updated Vital Signs BP 109/74   Pulse 71   Temp 98 F (36.7 C) (Oral)   Resp 16   Ht 5\' 8"  (1.727 m)   Wt 125.2 kg (276 lb)   SpO2 100%   BMI 41.97 kg/m   Physical Exam  Constitutional: She is oriented to person, place, and time. She appears well-developed and well-nourished.  HENT:  Head: Normocephalic and atraumatic.  Eyes: Pupils are equal, round, and reactive to light. Conjunctivae and EOM are normal.  Neck: Normal range of motion.  Cardiovascular: Normal rate and regular rhythm.   Pulmonary/Chest: No stridor. No respiratory distress. She has no wheezes.  Abdominal: Soft. She exhibits no distension.  Musculoskeletal: Normal range of motion. She exhibits no edema or deformity.  Neurological: She is alert and oriented to  person, place, and time.  Skin: Rash (pruritic, erythematous hives on left and right forearm) noted.  Nursing note and vitals reviewed.    ED Treatments / Results  Labs (all labs ordered are listed, but only abnormal results are displayed) Labs Reviewed - No data to display  EKG  EKG Interpretation None       Radiology No results found.  Procedures Procedures (including critical care time)  Medications Ordered in ED Medications  ranitidine (ZANTAC) 150 MG/10ML syrup 150 mg (150 mg Oral Given 12/11/16 0723)  diphenhydrAMINE (BENADRYL) capsule 50 mg (50 mg Oral Given 12/11/16 0722)  predniSONE (DELTASONE) tablet 60 mg (60 mg Oral Given 12/11/16 0723)     Initial  Impression / Assessment and Plan / ED Course  I have reviewed the triage vital signs and the nursing notes.  Pertinent labs & imaging results that were available during my care of the patient were reviewed by me and considered in my medical decision making (see chart for details).     Likely allergic reaction. Unsure of cause. Been going on since last night. No hypotension, vomiting, diarrhea, abdominal pain or sob. Will give allergic cocktail.   Observed fro approximately 4 hours with improvement in symptoms. Definitely no worsening. Stable for dc with allergy follow up.   Final Clinical Impressions(s) / ED Diagnoses   Final diagnoses:  Allergic reaction, initial encounter  Angioedema, initial encounter    New Prescriptions Discharge Medication List as of 12/11/2016  1:25 PM    START taking these medications   Details  diphenhydrAMINE (BENADRYL) 25 mg capsule Take 1 capsule (25 mg total) by mouth every 6 (six) hours as needed., Starting Wed 12/11/2016, Print    predniSONE (DELTASONE) 20 MG tablet 2 tabs po daily x 4 days, Print    ranitidine (ZANTAC) 150 MG tablet Take 1 tablet (150 mg total) by mouth 2 (two) times daily., Starting Wed 12/11/2016, Print         Virdia Ziesmer, Barbara Cower, MD 12/11/16 [phone]

## 2016-12-11 NOTE — ED Triage Notes (Signed)
Pt c/o lip swelling and a rash that began last night, pt took benadryl without relief. Denies allergies, says she ate mushrooms and eggs yesterday.

## 2016-12-11 NOTE — ED Notes (Signed)
ED Provider at bedside. 

## 2016-12-11 NOTE — ED Notes (Signed)
Pt is alert, in NAD. Still c/o swelling to upper lip, slight swelling to lower lip. No rash noted.

## 2016-12-16 ENCOUNTER — Emergency Department (HOSPITAL_COMMUNITY)
Admission: EM | Admit: 2016-12-16 | Discharge: 2016-12-16 | Disposition: A | Discharge status: Home / Self Care | Payer: BC Managed Care – PPO | Attending: Physician Assistant | Admitting: Physician Assistant

## 2016-12-16 ENCOUNTER — Encounter (HOSPITAL_COMMUNITY): Payer: Self-pay | Admitting: Emergency Medicine

## 2016-12-16 DIAGNOSIS — L509 Urticaria, unspecified: Secondary | ICD-10-CM | POA: Insufficient documentation

## 2016-12-16 DIAGNOSIS — R21 Rash and other nonspecific skin eruption: Secondary | ICD-10-CM

## 2016-12-16 MED ORDER — LORATADINE 10 MG PO TABS: 10.0000 mg | ORAL_TABLET | Freq: Every day | ORAL | 0 refills | Status: DC

## 2016-12-16 NOTE — ED Triage Notes (Signed)
Pt. Stated, I was here 5 days ago for the same problem. I have a rash almost all over. The medicine they gave me is not working.

## 2016-12-16 NOTE — ED Provider Notes (Signed)
[facility]PT Provider Note   CSN: [id number] Arrival date & time: 12/16/16  1749     History   Chief Complaint Chief Complaint  Patient presents with  . Allergic Reaction  . Rash    HPI Brittany Elliott is a 31 y.o. female.  HPI  Patient presents to ED for evaluation of pruritic rash on torso and hands. She states symptoms and hives have improved since she was seen here 5 days ago and was given prednisone, Benadryl and Zantac. She reports completing her prednisone and Zantac and is continuing to take the Benadryl. She denies any lip swelling, trouble breathing, chest pain or trouble swallowing. She denies any previous history of allergies. Denies any new use of new detergents, lotions, skin care products, fabrics. He denies any tick bites or insect bites.  Past Medical History:  Diagnosis Date  . Hx of metrorrhagia   . Obese     Patient Active Problem List   Diagnosis Date Noted  . Chlamydia infection affecting pregnancy in first trimester, antepartum 05/12/2014  . BV (bacterial vaginosis) 05/09/2014  . Spotting affecting pregnancy in first trimester 05/09/2014  . Elevated blood pressure affecting pregnancy in first trimester, antepartum 05/09/2014  . Subchorionic hemorrhage in first trimester - small 05/09/2014  . Hx of metrorrhagia   . Metrorrhagia 03/31/2012    Past Surgical History:  Procedure Laterality Date  . INDUCED ABORTION      OB History    Gravida Para Term Preterm AB Living   2 0     1     SAB TAB Ectopic Multiple Live Births                   Home Medications    Prior to Admission medications   Medication Sig Start Date End Date Taking? Authorizing Provider  Cholecalciferol (VITAMIN D3) 50000 units CAPS Take 50,000 Units by mouth once a week. Sundays 11/29/16   [provider]  diphenhydrAMINE (BENADRYL) 25 mg capsule Take 1 capsule (25 mg total) by mouth every 6 (six) hours as needed. 12/11/16   Mesner, Barbara CowerJason, MD  loratadine  (CLARITIN) 10 MG tablet Take 1 tablet (10 mg total) by mouth daily. 12/16/16   Josanna Hefel, PA-C  predniSONE (DELTASONE) 20 MG tablet 2 tabs po daily x 4 days 12/11/16   Mesner, Barbara CowerJason, MD  ranitidine (ZANTAC) 150 MG tablet Take 1 tablet (150 mg total) by mouth 2 (two) times daily. 12/11/16   Mesner, Barbara CowerJason, MD    Family History Family History  Problem Relation Age of Onset  . Hypertension Mother   . Diabetes Mother   . Kidney disease Mother     Social History Social History  Substance Use Topics  . Smoking status: Never Smoker  . Smokeless tobacco: Never Used  . Alcohol use Yes     Comment: occasional     Allergies   Patient has no known allergies.   Review of Systems Review of Systems  Constitutional: Negative for chills, fatigue and fever.  HENT: Negative for facial swelling.   Respiratory: Negative for chest tightness and shortness of breath.   Gastrointestinal: Negative for nausea and vomiting.  Skin: Positive for rash.     Physical Exam Updated Vital Signs BP 116/79 (BP Location: Left Arm)   Pulse 74   Temp 98.2 F (36.8 C) (Oral)   Resp 17   Ht 5\' 8"  (1.727 m)   Wt 125.2 kg (276 lb)   LMP 12/16/2016   SpO2  100%   BMI 41.97 kg/m   Physical Exam  Constitutional: She appears well-developed and well-nourished. No distress.  HENT:  Head: Normocephalic and atraumatic.  Eyes: Conjunctivae and EOM are normal. No scleral icterus.  Neck: Normal range of motion.  Cardiovascular: Normal rate and regular rhythm.   Pulmonary/Chest: Effort normal and breath sounds normal. No respiratory distress.  Neurological: She is alert.  Skin: Rash noted. She is not diaphoretic.  Mild urticarial and erythematous rash noted on chest and hands bilaterally. There is no excoriation or streaking noted. No temperature change noted.  There is no lip swelling noted. There are no signs of airway compromise this patient does not appear in respiratory distress with no stridor or wheezing  noted.  Psychiatric: She has a normal mood and affect.  Nursing note and vitals reviewed.    ED Treatments / Results  Labs (all labs ordered are listed, but only abnormal results are displayed) Labs Reviewed - No data to display  EKG  EKG Interpretation None       Radiology No results found.  Procedures Procedures (including critical care time)  Medications Ordered in ED Medications - No data to display   Initial Impression / Assessment and Plan / ED Course  I have reviewed the triage vital signs and the nursing notes.  Pertinent labs & imaging results that were available during my care of the patient were reviewed by me and considered in my medical decision making (see chart for details).     Patient presents to ED for evaluation of rash that she states has improved with prednisone and Zantac given to her at her previous ED visit 5 days ago. She states that she finished a course of prednisone and Zantac as continuing to take Benadryl. She states that the hives have improved but she is continuing to have itching. She denies any lip swelling, wheezing, trouble breathing, trouble swallowing, drooling.  she denies any new use of lotions, detergents, soaps or fabrics. Physical exam there is mild erythematous pruritic rash on the chest and bilateral hands. There are no signs of airway compromise noted. Pt has a patent airway without stridor and is handling secretions without difficulty; no angioedema. No blisters, no pustules, no warmth, no draining sinus tracts, no superficial abscesses, no bullous impetigo, no vesicles, no desquamation, no target lesions with dusky purpura or a central bulla. Not tender to touch. No concern for superimposed infection. No concern for SJS, TEN, TSS, tick borne illness, syphilis or other life-threatening condition. Will advise patient to take longer acting antihistamine such as Claritin, Zyrtec or Xyzal for further management of her symptoms and to  discontinue taking Benadryl since it is shorter acting. We'll also advise her to use calamine lotion for symptomatic relief and soothing. I advised her to follow-up with PCP for further evaluation and any referrals for allergy testing. Patient appears stable for discharge at this time. Strict return precautions given.    Final Clinical Impressions(s) / ED Diagnoses   Final diagnoses:  Rash    New Prescriptions Discharge Medication List as of 12/16/2016  8:43 PM    START taking these medications   Details  loratadine (CLARITIN) 10 MG tablet Take 1 tablet (10 mg total) by mouth daily., Starting Mon 12/16/2016, Print         Idelle Leech, [place], PA-C 12/16/16 2338    Abelino Derrick, MD 12/18/16 0000

## 2016-12-16 NOTE — Discharge Instructions (Signed)
Please read attached information regarding her condition. Take Claritin daily. Do not take Benadryl while taking other antihistamines. Can either take Claritin, Zyrtec or Xyzal daily for allergies. Follow up with PCP for further evaluation and management. Return to ED for worsening symptoms, trouble breathing, trouble swallowing, lip swelling, chest pain.

## 2016-12-16 NOTE — ED Notes (Signed)
Patient Alert and oriented X4. Stable and ambulatory. Patient verbalized understanding of the discharge instructions.  Patient belongings were taken by the patient.  

## 2017-06-13 ENCOUNTER — Other Ambulatory Visit: Payer: Self-pay | Admitting: Obstetrics and Gynecology

## 2018-04-21 ENCOUNTER — Encounter (HOSPITAL_COMMUNITY): Payer: Self-pay | Admitting: Emergency Medicine

## 2018-04-21 ENCOUNTER — Other Ambulatory Visit: Payer: Self-pay

## 2018-04-21 ENCOUNTER — Emergency Department (HOSPITAL_COMMUNITY)
Admission: EM | Admit: 2018-04-21 | Discharge: 2018-04-22 | Disposition: A | Discharge status: Home / Self Care | Payer: BC Managed Care – PPO | Attending: Emergency Medicine | Admitting: Emergency Medicine

## 2018-04-21 ENCOUNTER — Emergency Department (HOSPITAL_COMMUNITY): Payer: BC Managed Care – PPO

## 2018-04-21 DIAGNOSIS — H53149 Visual discomfort, unspecified: Secondary | ICD-10-CM | POA: Insufficient documentation

## 2018-04-21 DIAGNOSIS — R519 Headache, unspecified: Secondary | ICD-10-CM

## 2018-04-21 DIAGNOSIS — R51 Headache: Secondary | ICD-10-CM | POA: Diagnosis not present

## 2018-04-21 DIAGNOSIS — I10 Essential (primary) hypertension: Secondary | ICD-10-CM | POA: Insufficient documentation

## 2018-04-21 LAB — BASIC METABOLIC PANEL
ANION GAP: 7 (ref 5–15)
BUN: 12 mg/dL (ref 6–20)
CALCIUM: 9 mg/dL (ref 8.9–10.3)
CO2: 24 mmol/L (ref 22–32)
Chloride: 107 mmol/L (ref 98–111)
Creatinine, Ser: 0.76 mg/dL (ref 0.44–1.00)
Glucose, Bld: 95 mg/dL (ref 70–99)
Potassium: 3.6 mmol/L (ref 3.5–5.1)
Sodium: 138 mmol/L (ref 135–145)

## 2018-04-21 MED ORDER — METOCLOPRAMIDE HCL 5 MG/ML IJ SOLN
10.0000 mg | Freq: Once | INTRAMUSCULAR | Status: AC
  Administered 2018-04-21: 10 mg via INTRAVENOUS
  Filled 2018-04-21: qty 2

## 2018-04-21 MED ORDER — DIPHENHYDRAMINE HCL 50 MG/ML IJ SOLN
25.0000 mg | Freq: Once | INTRAMUSCULAR | Status: AC
  Administered 2018-04-21: 25 mg via INTRAVENOUS
  Filled 2018-04-21: qty 1

## 2018-04-21 MED ORDER — HYDROCHLOROTHIAZIDE 12.5 MG PO TABS: 12.5000 mg | ORAL_TABLET | Freq: Every day | ORAL | 0 refills | Status: DC

## 2018-04-21 MED ORDER — SODIUM CHLORIDE 0.9 % IV BOLUS
500.0000 mL | Freq: Once | INTRAVENOUS | Status: AC
Start: 2018-04-21 — End: 2018-04-21
  Administered 2018-04-21: 500 mL via INTRAVENOUS

## 2018-04-21 NOTE — ED Notes (Signed)
Pt transported to CT ?

## 2018-04-21 NOTE — ED Provider Notes (Signed)
[facility]-EMERGENCY DEPT Provider Note   CSN: [id number] Arrival date & time: 04/21/18  1956     History   Chief Complaint Chief Complaint  Patient presents with  . Hypertension    HPI Brittany Elliott is a 32 y.o. female.  Patient's with no significant past medical history presents the emergency department today with complaint of headache in the setting of recently noted hypertension.  Patient states that she started having a headache 3 days ago approximately.  Headache was occipital nature.  She describes the pain as throbbing and nonradiating.  She states that the pain started before she got into the shower.  It was aggravating but the headache did not prevent her from doing any of her chores or her job.  She has had her blood pressure checked several times because this runs in her family.  She has had multiple episodes of blood pressures elevated in the 170-180 systolic range.  Her blood pressure was elevated at work today and she went to an outside urgent care where she was given 0.1 mg of clonidine and referred to the emergency department for further evaluation.  Patient denies signs of stroke, at any time, including: facial droop, slurred speech, aphasia, weakness/numbness in extremities, imbalance/trouble walking.  Patient denies any neck pain and can move her neck side to side without any difficulty.  She has had no fevers or URI symptoms.  She has taken ibuprofen with some improvement in her symptoms.  The onset of this condition was acute. The course is waxing and waning. Aggravating factors: Bright light. Alleviating factors: none.  Patient denies any routine history of headaches.      Past Medical History:  Diagnosis Date  . Hx of metrorrhagia   . Obese     Patient Active Problem List   Diagnosis Date Noted  . Chlamydia infection affecting pregnancy in first trimester, antepartum 05/12/2014  . BV (bacterial vaginosis) 05/09/2014  . Spotting  affecting pregnancy in first trimester 05/09/2014  . Elevated blood pressure affecting pregnancy in first trimester, antepartum 05/09/2014  . Subchorionic hemorrhage in first trimester - small 05/09/2014  . Hx of metrorrhagia   . Metrorrhagia 03/31/2012    Past Surgical History:  Procedure Laterality Date  . INDUCED ABORTION       OB History    Gravida  2   Para  0   Term      Preterm      AB  1   Living        SAB      TAB      Ectopic      Multiple      Live Births               Home Medications    Prior to Admission medications   Medication Sig Start Date End Date Taking? Authorizing Provider  Cholecalciferol (VITAMIN D3) 50000 units CAPS Take 50,000 Units by mouth once a week. Sundays 11/29/16   [provider]  diphenhydrAMINE (BENADRYL) 25 mg capsule Take 1 capsule (25 mg total) by mouth every 6 (six) hours as needed. 12/11/16   Mesner, Barbara Cower, MD  loratadine (CLARITIN) 10 MG tablet Take 1 tablet (10 mg total) by mouth daily. 12/16/16   Khatri, Hina, PA-C  predniSONE (DELTASONE) 20 MG tablet 2 tabs po daily x 4 days 12/11/16   Mesner, Barbara Cower, MD  ranitidine (ZANTAC) 150 MG tablet Take 1 tablet (150 mg total) by mouth 2 (two) times  daily. 12/11/16   Mesner, Barbara CowerJason, MD    Family History Family History  Problem Relation Age of Onset  . Hypertension Mother   . Diabetes Mother   . Kidney disease Mother     Social History Social History   Tobacco Use  . Smoking status: Never Smoker  . Smokeless tobacco: Never Used  Substance Use Topics  . Alcohol use: Yes    Comment: occasional  . Drug use: No     Allergies   Patient has no known allergies.   Review of Systems Review of Systems  Constitutional: Negative for fever.  HENT: Negative for congestion, dental problem, rhinorrhea and sinus pressure.   Eyes: Positive for photophobia. Negative for discharge, redness and visual disturbance.  Respiratory: Negative for shortness of breath.     Cardiovascular: Negative for chest pain.  Gastrointestinal: Negative for nausea and vomiting.  Musculoskeletal: Negative for gait problem, neck pain and neck stiffness.  Skin: Negative for rash.  Neurological: Positive for headaches. Negative for syncope, speech difficulty, weakness, light-headedness and numbness.  Psychiatric/Behavioral: Negative for confusion.     Physical Exam Updated Vital Signs BP (!) 167/102 (BP Location: Right Arm) Comment: rn notified  Pulse 65   Temp 99.8 F (37.7 C) (Oral)   Resp 16   Ht 5\' 8"  (1.727 m)   Wt 120.7 kg   LMP 04/19/2018   SpO2 99%   BMI 40.45 kg/m   Physical Exam  Constitutional: She is oriented to person, place, and time. She appears well-developed and well-nourished.  HENT:  Head: Normocephalic and atraumatic.  Right Ear: Tympanic membrane, external ear and ear canal normal.  Left Ear: Tympanic membrane, external ear and ear canal normal.  Nose: Nose normal.  Mouth/Throat: Uvula is midline, oropharynx is clear and moist and mucous membranes are normal.  Eyes: Pupils are equal, round, and reactive to light. Conjunctivae, EOM and lids are normal. Right eye exhibits no nystagmus. Left eye exhibits no nystagmus.  Neck: Normal range of motion. Neck supple.  Cardiovascular: Normal rate and regular rhythm.  Pulmonary/Chest: Effort normal and breath sounds normal.  Abdominal: Soft. There is no tenderness.  Musculoskeletal:       Cervical back: She exhibits normal range of motion, no tenderness and no bony tenderness.  Neurological: She is alert and oriented to person, place, and time. She has normal strength and normal reflexes. No cranial nerve deficit or sensory deficit. She displays a negative Romberg sign. Coordination and gait normal. GCS eye subscore is 4. GCS verbal subscore is 5. GCS motor subscore is 6.  Skin: Skin is warm and dry.  Psychiatric: She has a normal mood and affect.  Nursing note and vitals reviewed.    ED  Treatments / Results  Labs (all labs ordered are listed, but only abnormal results are displayed) Labs Reviewed  BASIC METABOLIC PANEL    EKG None  Radiology Ct Head Wo Contrast  Result Date: 04/21/2018 CLINICAL DATA:  Severe headache for 4 days.  Hypertension. EXAM: CT HEAD WITHOUT CONTRAST TECHNIQUE: Contiguous axial images were obtained from the base of the skull through the vertex without intravenous contrast. COMPARISON:  None. FINDINGS: Brain: No evidence of acute infarction, hemorrhage, hydrocephalus, extra-axial collection, or mass lesion/mass effect. Vascular:  No hyperdense vessel or other acute findings. Skull: No evidence of fracture or other significant bone abnormality. Sinuses/Orbits:  No acute findings. Other: None. IMPRESSION: Negative noncontrast head CT. Electronically Signed   By: Myles RosenthalJohn  Stahl M.D.   On: 04/21/2018 22:23  Procedures Procedures (including critical care time)  Medications Ordered in ED Medications  metoCLOPramide (REGLAN) injection 10 mg (10 mg Intravenous Given 04/21/18 2203)  diphenhydrAMINE (BENADRYL) injection 25 mg (25 mg Intravenous Given 04/21/18 2201)  sodium chloride 0.9 % bolus 500 mL (0 mLs Intravenous Stopped 04/21/18 2323)     Initial Impression / Assessment and Plan / ED Course  I have reviewed the triage vital signs and the nursing notes.  Pertinent labs & imaging results that were available during my care of the patient were reviewed by me and considered in my medical decision making (see chart for details).     Patient seen and examined.  Patient will likely need to be put on blood pressure medication so we will check creatinine.  Given that this is a new headache, will perform CT evaluation to evaluate for intercranial abnormality.  Given patient's current symptoms, without meningismus, fever, confusion, or other neurologic findings, I have overall low suspicion for meningitis or intracranial hemorrhage.  Vital signs reviewed  and are as follows: BP (!) 167/102 (BP Location: Right Arm) Comment: rn notified  Pulse 65   Temp 99.8 F (37.7 C) (Oral)   Resp 16   Ht 5\' 8"  (1.727 m)   Wt 120.7 kg   LMP 04/19/2018   SpO2 99%   BMI 40.45 kg/m   11:22 PM patient updated on all results.  Kidney function is normal.  Her HA is completely resolved after IV meds and her exam is unchanged.  She is a little tired as expected.  She will be started on HCTZ.    We also discussed role of lumbar puncture to definitively rule out bleeding in the brain.  I discussed with patient that given her isolated headache without any neurological deficits, reassuring history, without marked elevation of blood pressure makes me feel that she is low risk for bleeding.  Discussed LP procedure.  Answered patient's questions.  We agreed to defer at this point unless symptoms became worse.  We discussed signs and symptoms which should cause her to immediately seek medical attention including worsening severe headache, confusion, vomiting, strokelike symptoms.  Patient agrees with this plan.  She seems reliable to return and family member at bedside also agrees to watch for the symptoms.  Final Clinical Impressions(s) / ED Diagnoses   Final diagnoses:  Acute nonintractable headache, unspecified headache type  Hypertension, unspecified type   HA: Patient without high-risk features of headache including: thunderclap HA, altered mental status, accompanying seizure, headache with exertion, age > 78, history of immunocompromise, neck or shoulder pain, fever, use of anticoagulation, family history of spontaneous SAH, concomitant drug use, toxic exposure.   Patient has a normal complete neurological exam, normal vital signs, normal level of consciousness, no signs of meningismus, is well-appearing/non-toxic appearing, no signs of trauma.   CT performed without evidence of mass lesion or obvious bleeding.   No dangerous or life-threatening conditions  suspected or identified by history, physical exam, and by work-up. No indications for hospitalization identified.     ED Discharge Orders         Ordered    hydrochlorothiazide (HYDRODIURIL) 12.5 MG tablet  Daily     04/21/18 2237           Renne Crigler, PA-C 04/21/18 2329    Lorre Nick, MD 04/25/18 [id number]

## 2018-04-21 NOTE — Discharge Instructions (Signed)
Please read and follow all provided instructions.  Your diagnoses today include:  1. Acute nonintractable headache, unspecified headache type   2. Hypertension, unspecified type     Tests performed today include:  CT of your head which was normal and did not show any serious cause of your headache  Vital signs. See below for your results today.   Medications:  In the Emergency Department you received:  Reglan - antinausea/headache medication  Benadryl - antihistamine to counteract potential side effects of reglan   HCTZ - medication to take for high blood pressure  Take any prescribed medications only as directed.  Additional information:  Follow any educational materials contained in this packet.  You are having a headache. No specific cause was found today for your headache. It may have been a migraine or other cause of headache. Stress, anxiety, fatigue, and depression are common triggers for headaches.   Your headache today does not appear to be life-threatening or require hospitalization, but often the exact cause of headaches is not determined in the emergency department. Therefore, follow-up with your doctor is very important to find out what may have caused your headache and whether or not you need any further diagnostic testing or treatment.   Sometimes headaches can appear benign (not harmful), but then more serious symptoms can develop which should prompt an immediate re-evaluation by your doctor or the emergency department.  BE VERY CAREFUL not to take multiple medicines containing Tylenol (also called acetaminophen). Doing so can lead to an overdose which can damage your liver and cause liver failure and possibly death.   Follow-up instructions: Please follow-up with your primary care provider in the next 3 days for further evaluation of your symptoms.   Return instructions:   Please return to the Emergency Department if you experience worsening  symptoms.  Return if the medications do not resolve your headache, if it recurs, or if you have multiple episodes of vomiting or cannot keep down fluids.  Return if you have a change from the usual headache.  RETURN IMMEDIATELY IF you:  Develop a sudden, severe headache  Develop confusion or become poorly responsive or faint  Develop a fever above 100.47F or problem breathing  Have a change in speech, vision, swallowing, or understanding  Develop new weakness, numbness, tingling, incoordination in your arms or legs  Have a seizure  Please return if you have any other emergent concerns.  Additional Information:  Your vital signs today were: BP (!) 161/109 (BP Location: Right Arm)    Pulse 78    Temp 99.8 F (37.7 C) (Oral)    Resp 16    Ht 5\' 8"  (1.727 m)    Wt 120.7 kg    LMP 04/19/2018    SpO2 100%    BMI 40.45 kg/m  If your blood pressure (BP) was elevated above 135/85 this visit, please have this repeated by your doctor within one month. --------------

## 2018-04-21 NOTE — ED Triage Notes (Signed)
Pt arriving with hypertension that began Saturday. Pt has no cardiac history, no history of HTN. Denies chest pain

## 2018-05-08 ENCOUNTER — Ambulatory Visit: Payer: BC Managed Care – PPO | Attending: Family Medicine | Admitting: Physician Assistant

## 2018-05-08 VITALS — BP 117/74 | HR 82 | Temp 98.2°F | Resp 18 | Ht 68.0 in | Wt 265.0 lb

## 2018-05-08 DIAGNOSIS — R51 Headache: Secondary | ICD-10-CM | POA: Insufficient documentation

## 2018-05-08 DIAGNOSIS — I10 Essential (primary) hypertension: Secondary | ICD-10-CM

## 2018-05-08 MED ORDER — HYDROCHLOROTHIAZIDE 12.5 MG PO TABS: 12.5000 mg | ORAL_TABLET | Freq: Every day | ORAL | 5 refills | Status: DC

## 2018-05-08 NOTE — Patient Instructions (Signed)

## 2018-05-08 NOTE — Progress Notes (Signed)
[facility] [place]  ZOX:[id number]  [id number]:[id number]  DOB - April 09, 1986  Chief Complaint  Patient presents with  . Follow-up       Subjective:   Brittany Elliott is a 32 y.o. female here today for establishment of care.  She does not have a lot of past medical history.  She presented to the emergency department on 04/21/2018 at the request of urgent care providers.  Initially she started develop a headache in the back of her head.  It was a throbbing-like sensation without any radiation.  No signs or symptoms of stroke.  She noted her blood pressure to be elevated and went to the local urgent care.  Her blood pressure was still elevated than she was treated with clonidine and sent to [facility].  A CT scan of her head was negative.  Her BMP was okay.  She was treated with a migraine cocktail and given IV fluids.  A systolic blood pressure was in the 170s.  She was started on hydrochlorothiazide 12.5 mg daily.  She has been keeping a blood pressure log.  Her systolic blood pressure is anywhere from 1 12-174.  Diastolically usually in the 80s.  She has been compliant with her medications.  No leg cramps.  No headaches.  No nausea or vomiting.  ROS: GEN: denies fever or chills, denies change in weight Skin: denies lesions or rashes HEENT: denies headache, earache, epistaxis, sore throat, or neck pain LUNGS: denies SHOB, dyspnea, PND, orthopnea CV: denies CP or palpitations ABD: denies abd pain, N or V EXT: denies muscle spasms or swelling; no pain in lower ext, no weakness NEURO: denies numbness or tingling, denies sz, stroke or TIA   ALLERGIES: No Known Allergies  PAST MEDICAL HISTORY: Past Medical History:  Diagnosis Date  . Hx of metrorrhagia   . Obese     PAST SURGICAL HISTORY: Past Surgical History:  Procedure Laterality Date  . INDUCED ABORTION      MEDICATIONS AT HOME: Prior to Admission medications   Medication Sig Start Date End Date Taking? Authorizing Provider    acetaminophen (TYLENOL) 500 MG tablet Take 1,000 mg by mouth every 6 (six) hours as needed for mild pain.   Yes [provider]  hydrochlorothiazide (HYDRODIURIL) 12.5 MG tablet Take 1 tablet (12.5 mg total) by mouth daily. 05/08/18  Yes Danelle Earthly, Grae Cannata S, PA-C  ibuprofen (ADVIL,MOTRIN) 200 MG tablet Take 400 mg by mouth every 6 (six) hours as needed for mild pain.   Yes [provider]  NUVARING 0.12-0.015 MG/24HR vaginal ring Place 1 each vaginally every 21 ( twenty-one) days. 04/15/18  Yes [provider]    Family History  Problem Relation Age of Onset  . Hypertension Mother   . Diabetes Mother   . Kidney disease Mother    Social-single, no children, Contractor, nonsmoker Fam-CHF, kidney failure, HTN, DM2, breast CA  Objective:   Vitals:   05/08/18 1344  BP: 117/74  Pulse: 82  Resp: 18  Temp: 98.2 F (36.8 C)  TempSrc: Oral  SpO2: 99%  Weight: 265 lb (120.2 kg)  Height: 5\' 8"  (1.727 m)    Exam General appearance : Awake, alert, not in any distress. Speech Clear. Not toxic looking HEENT: Atraumatic and Normocephalic, pupils equally reactive to light and accomodation Neck: supple, no JVD. No cervical lymphadenopathy.  Chest:Good air entry bilaterally, no added sounds  CVS: S1 S2 regular, no murmurs.  Abdomen: Bowel sounds present, Non tender and not distended with  no guarding, rigidity or rebound. Extremities: B/L Lower Ext shows trace edema, both legs are warm to touch Neurology: Awake alert, and oriented X 3, CN II-XII intact, Non focal Skin:No Rash Wounds:N/A   Assessment & Plan  1. Hypertension  -keep BP log  -DASH diet  -refill HCTZ 12.5 mg daily  -Inc exercise, encouraged weight loss  -aggressive RF modification   Return in about 4 weeks (around 06/05/2018).  The patient was given clear instructions to go to ER or return to medical center if symptoms don't improve, worsen or new problems develop. The patient  verbalized understanding. The patient was told to call to get lab results if they haven't heard anything in the next week.   Total time spent with patient was 36 min. Greater than 50 % of this visit was spent face to face counseling and coordinating care regarding risk factor modification, compliance importance and encouragement, education related to high blood pressure.  This note has been created with Education officer, environmentalDragon speech recognition software and smart phrase technology. Any transcriptional errors are unintentional.    Scot Juniffany Kyira Volkert, PA-C [facility] [place], [place] [phone]   05/08/2018, 1:58 PM

## 2018-05-09 LAB — BASIC METABOLIC PANEL
BUN / CREAT RATIO: 13 (ref 9–23)
BUN: 12 mg/dL (ref 6–20)
CHLORIDE: 104 mmol/L (ref 96–106)
CO2: 22 mmol/L (ref 20–29)
Calcium: 9.3 mg/dL (ref 8.7–10.2)
Creatinine, Ser: 0.93 mg/dL (ref 0.57–1.00)
GFR calc Af Amer: 95 mL/min/{1.73_m2} (ref >59)
GFR calc non Af Amer: 82 mL/min/{1.73_m2} (ref >59)
GLUCOSE: 98 mg/dL (ref 65–99)
POTASSIUM: 3.9 mmol/L (ref 3.5–5.2)
SODIUM: 141 mmol/L (ref 134–144)

## 2018-05-25 ENCOUNTER — Other Ambulatory Visit: Payer: Self-pay | Admitting: Obstetrics and Gynecology

## 2018-06-05 ENCOUNTER — Ambulatory Visit: Payer: BC Managed Care – PPO | Admitting: Family Medicine

## 2018-06-17 DIAGNOSIS — L680 Hirsutism: Secondary | ICD-10-CM | POA: Insufficient documentation

## 2018-06-26 ENCOUNTER — Ambulatory Visit: Payer: BC Managed Care – PPO | Attending: Family Medicine | Admitting: Family Medicine

## 2018-06-26 ENCOUNTER — Encounter: Payer: Self-pay | Admitting: Family Medicine

## 2018-06-26 VITALS — BP 118/73 | HR 75 | Temp 98.6°F | Resp 18 | Ht 68.0 in | Wt 263.0 lb

## 2018-06-26 DIAGNOSIS — I10 Essential (primary) hypertension: Secondary | ICD-10-CM

## 2018-06-26 DIAGNOSIS — Z6839 Body mass index (BMI) 39.0-39.9, adult: Secondary | ICD-10-CM

## 2018-06-26 DIAGNOSIS — E6609 Other obesity due to excess calories: Secondary | ICD-10-CM | POA: Diagnosis not present

## 2018-06-26 MED ORDER — HYDROCHLOROTHIAZIDE 12.5 MG PO TABS: 12.5000 mg | ORAL_TABLET | Freq: Every day | ORAL | 5 refills | Status: DC

## 2018-06-26 NOTE — Progress Notes (Signed)
Subjective:    Patient ID: Brittany Elliott, female    DOB: July 27, 1985, 33 y.o.   MRN: [id number]  HPI       33 yo female who is seen in follow-up of hypertension status post recent visit on 05/08/18 with another provider here to establish care.  Patient had been started on hydrochlorothiazide 12.5 mg after presented to the emergency department earlier this year secondary to a headache.  Patient states that since being on medication for her blood pressure and taking the blood pressure medication daily, her headaches have resolved.  Patient denies any issues with muscle cramping or feeling lightheaded with the use of hydrochlorothiazide.  Patient has had no chest pain or palpitations, no shortness of breath or cough.  She denies any peripheral edema.  She states that she overall feels better.  She will try to increase exercise such as walking and is trying to change her diet in order to help with weight loss and control of her blood pressure.  Past Medical History:  Diagnosis Date  . Hx of metrorrhagia   . Obese    Family History  Problem Relation Age of Onset  . Hypertension Mother   . Diabetes Mother   . Kidney disease Mother    Social History   Tobacco Use  . Smoking status: Never Smoker  . Smokeless tobacco: Never Used  Substance Use Topics  . Alcohol use: Yes    Comment: occasional  . Drug use: No   No Known Allergies    Review of Systems  Constitutional: Negative for chills, fatigue and fever.  HENT: Negative for sore throat and trouble swallowing.   Respiratory: Negative for cough and shortness of breath.   Cardiovascular: Negative for chest pain, palpitations and leg swelling.  Gastrointestinal: Negative for abdominal pain, blood in stool, constipation, diarrhea and nausea.  Endocrine: Negative for polydipsia, polyphagia and polyuria.  Genitourinary: Negative for dysuria and frequency.  Musculoskeletal: Negative for arthralgias and gait problem.  Neurological:  Negative for dizziness and headaches.  Hematological: Negative for adenopathy. Does not bruise/bleed easily.       Objective:   Physical Exam Vitals signs and nursing note reviewed.  Constitutional:      Appearance: Normal appearance. She is obese.  Neck:     Musculoskeletal: Normal range of motion and neck supple. No muscular tenderness.     Vascular: No carotid bruit.  Cardiovascular:     Rate and Rhythm: Normal rate and regular rhythm.  Pulmonary:     Effort: Pulmonary effort is normal.     Breath sounds: Normal breath sounds.  Abdominal:     Palpations: Abdomen is soft.     Tenderness: There is no abdominal tenderness. There is no right CVA tenderness, left CVA tenderness or guarding.  Musculoskeletal:        General: No swelling, tenderness or deformity.     Right lower leg: No edema.     Left lower leg: No edema.  Lymphadenopathy:     Cervical: No cervical adenopathy.  Skin:    General: Skin is warm and dry.  Neurological:     Mental Status: She is alert.  Psychiatric:        Mood and Affect: Mood normal.        Behavior: Behavior normal.        Thought Content: Thought content normal.    BP 118/73 (BP Location: Left Arm, Patient Position: Sitting, Cuff Size: Large)   Pulse 75   Temp  98.6 F (37 C) (Oral)   Resp 18   Ht 5\' 8"  (1.727 m)   Wt 263 lb (119.3 kg)   LMP 06/17/2018   SpO2 99%   BMI 39.99 kg/m         Assessment & Plan:  1. Essential hypertension At today's visit, patient's blood pressure appears to be well controlled on her current hydrochlorothiazide.  DASH diet is encouraged as well as continued efforts at weight loss through dietary changes and regular cardiovascular exercise.  Refill provided of hydrochlorothiazide.  2. Class 2 obesity due to excess calories with body mass index (BMI) of 39.0 to 39.9 in adult, unspecified whether serious comorbidity present Patient is encouraged to follow a Dash type diet which would help with both blood  pressure as well as weight loss.  Diet is low in processed foods with most foods being fresh vegetables and fresh fruits and lean meats.  Regular exercise such as low impact walking encouraged with goal of weight loss which will also help with patient's blood pressure and overall health.  An After Visit Summary was printed and given to the patient.  Allergies as of 06/26/2018   No Known Allergies     Medication List       Accurate as of June 26, 2018 11:59 PM. If you have any questions, ask your nurse or doctor.        acetaminophen 500 MG tablet Commonly known as:  TYLENOL Take 1,000 mg by mouth every 6 (six) hours as needed for mild pain.   hydrochlorothiazide 12.5 MG tablet Commonly known as:  HYDRODIURIL Take 1 tablet (12.5 mg total) by mouth daily.   ibuprofen 200 MG tablet Commonly known as:  ADVIL Take 400 mg by mouth every 6 (six) hours as needed for mild pain.   NuvaRing 0.12-0.015 MG/24HR vaginal ring Generic drug:  etonogestrel-ethinyl estradiol Place 1 each vaginally every 21 ( twenty-one) days.      Return in about 5 months (around 11/24/2018) for HTN and fasting blood work.

## 2018-06-26 NOTE — Patient Instructions (Signed)
BMI for Adults    Body mass index (BMI) is a number that is calculated from a person's weight and height. BMI may help to estimate how much of a person's weight is composed of fat. BMI can help identify those who may be at higher risk for certain medical problems.  How is BMI used with adults?  BMI is used as a screening tool to identify possible weight problems. It is used to check whether a person is obese, overweight, healthy weight, or underweight.  How is BMI calculated?  BMI measures your weight and compares it to your height. This can be done either in English ([place] or metric measurements. Note that charts are available to help you find your BMI quickly and easily without having to do these calculations yourself.  To calculate your BMI in English ([place] measurements, your health care provider will:  1. Measure your weight in pounds (lb).  2. Multiply the number of pounds by 703.  ? For example, for a person who weighs 180 lb, multiply that number by 703, which equals 126,540.  3. Measure your height in inches (in). Then multiply that number by itself to get a measurement called "inches squared."  ? For example, for a person who is 70 in tall, the "inches squared" measurement is 70 in x 70 in, which equals 4900 inches squared.  4. Divide the total from Step 2 (number of lb x 703) by the total from Step 3 (inches squared): 126,540 ÷ 4900 = 25.8. This is your BMI.  To calculate your BMI in metric measurements, your health care provider will:  1. Measure your weight in kilograms (kg).  2. Measure your height in meters (m). Then multiply that number by itself to get a measurement called "meters squared."  ? For example, for a person who is 1.75 m tall, the "meters squared" measurement is 1.75 m x 1.75 m, which is equal to 3.1 meters squared.  3. Divide the number of kilograms (your weight) by the meters squared number. In this example: 70 ÷ 3.1 = 22.6. This is your BMI.  How is BMI interpreted?  To interpret your  results, your health care provider will use BMI charts to identify whether you are underweight, normal weight, overweight, or obese. The following guidelines will be used:  · Underweight: BMI less than 18.5.  · Normal weight: BMI between 18.5 and 24.9.  · Overweight: BMI between 25 and 29.9.  · Obese: BMI of 30 and above.  Please note:  · Weight includes both fat and muscle, so someone with a muscular build, such as an athlete, may have a BMI that is higher than 24.9. In cases like these, BMI is not an accurate measure of body fat.  · To determine if excess body fat is the cause of a BMI of 25 or higher, further assessments may need to be done by a health care provider.  · BMI is usually interpreted in the same way for men and women.  Why is BMI a useful tool?  BMI is useful in two ways:  · Identifying a weight problem that may be related to a medical condition, or that may increase the risk for medical problems.  · Promoting lifestyle and diet changes in order to reach a healthy weight.  Summary  · Body mass index (BMI) is a number that is calculated from a person's weight and height.  · BMI may help to estimate how much of a person's weight is   composed of fat. BMI can help identify those who may be at higher risk for certain medical problems.  · BMI can be measured using English measurements or metric measurements.  · To interpret your results, your health care provider will use BMI charts to identify whether you are underweight, normal weight, overweight, or obese.  This information is not intended to replace advice given to you by your health care provider. Make sure you discuss any questions you have with your health care provider.  Document Released: 01/23/2004 Document Revised: 03/26/2017 Document Reviewed: 03/26/2017  Elsevier Interactive Patient Education © 2019 [employer]

## 2018-10-17 ENCOUNTER — Encounter: Payer: Self-pay | Admitting: Family Medicine

## 2018-11-24 ENCOUNTER — Ambulatory Visit: Payer: BC Managed Care – PPO | Admitting: Family Medicine

## 2019-01-08 ENCOUNTER — Ambulatory Visit: Payer: BC Managed Care – PPO | Admitting: Family Medicine

## 2019-01-27 ENCOUNTER — Ambulatory Visit: Payer: BC Managed Care – PPO | Admitting: Family Medicine

## 2019-02-03 ENCOUNTER — Ambulatory Visit: Payer: BC Managed Care – PPO

## 2019-02-26 ENCOUNTER — Ambulatory Visit: Payer: BC Managed Care – PPO | Admitting: Pharmacist

## 2019-02-26 ENCOUNTER — Encounter: Payer: Self-pay | Admitting: Family Medicine

## 2019-02-26 ENCOUNTER — Other Ambulatory Visit: Payer: Self-pay

## 2019-02-26 ENCOUNTER — Ambulatory Visit: Payer: BC Managed Care – PPO | Attending: Family Medicine | Admitting: Family Medicine

## 2019-02-26 VITALS — BP 137/86 | HR 102 | Ht 68.0 in | Wt 251.8 lb

## 2019-02-26 DIAGNOSIS — Z23 Encounter for immunization: Secondary | ICD-10-CM

## 2019-02-26 DIAGNOSIS — Z6838 Body mass index (BMI) 38.0-38.9, adult: Secondary | ICD-10-CM

## 2019-02-26 DIAGNOSIS — Z8679 Personal history of other diseases of the circulatory system: Secondary | ICD-10-CM

## 2019-02-26 DIAGNOSIS — E669 Obesity, unspecified: Secondary | ICD-10-CM | POA: Diagnosis not present

## 2019-02-26 DIAGNOSIS — R030 Elevated blood-pressure reading, without diagnosis of hypertension: Secondary | ICD-10-CM

## 2019-02-26 NOTE — Progress Notes (Signed)
Established Patient Office Visit  Subjective:  Patient ID: Brittany Elliott, female    DOB: 1985-08-16  Age: 33 y.o. MRN: [id number]  CC:  Chief Complaint  Patient presents with  . Hypertension    HPI Brittany Elliott presents for follow-up of Hypertension. She reports that she is no longer taking the HCTZ as she felt that this caused her to have some hair breakage so she stopped the medication a few months ago. She has made changes in her diet and has lost weight. She is monitoring her BP at home with a home BP wrist monitor and home blood pressures are in the 120's/80's and she denies any headaches or dizziness related to her BP.   Past Medical History:  Diagnosis Date  . Hx of metrorrhagia   . Obese     Past Surgical History:  Procedure Laterality Date  . INDUCED ABORTION      Family History  Problem Relation Age of Onset  . Hypertension Mother   . Diabetes Mother   . Kidney disease Mother     Social History   Socioeconomic History  . Marital status: Single    Spouse name: Not on file  . Number of children: Not on file  . Years of education: Not on file  . Highest education level: Not on file  Occupational History  . Not on file  Social Needs  . Financial resource strain: Not on file  . Food insecurity    Worry: Not on file    Inability: Not on file  . Transportation needs    Medical: Not on file    Non-medical: Not on file  Tobacco Use  . Smoking status: Never Smoker  . Smokeless tobacco: Never Used  Substance and Sexual Activity  . Alcohol use: Yes    Comment: occasional  . Drug use: No  . Sexual activity: Yes  Lifestyle  . Physical activity    Days per week: Not on file    Minutes per session: Not on file  . Stress: Not on file  Relationships  . Social Musician on phone: Not on file    Gets together: Not on file    Attends religious service: Not on file    Active member of club or organization: Not on file    Attends  meetings of clubs or organizations: Not on file    Relationship status: Not on file  . Intimate partner violence    Fear of current or ex partner: Not on file    Emotionally abused: Not on file    Physically abused: Not on file    Forced sexual activity: Not on file  Other Topics Concern  . Not on file  Social History Narrative  . Not on file    Outpatient Medications Prior to Visit  Medication Sig Dispense Refill  . acetaminophen (TYLENOL) 500 MG tablet Take 1,000 mg by mouth every 6 (six) hours as needed for mild pain.    Marland Kitchen ibuprofen (ADVIL,MOTRIN) 200 MG tablet Take 400 mg by mouth every 6 (six) hours as needed for mild pain.    Marland Kitchen NUVARING 0.12-0.015 MG/24HR vaginal ring Place 1 each vaginally every 21 ( twenty-one) days.    . hydrochlorothiazide (HYDRODIURIL) 12.5 MG tablet Take 1 tablet (12.5 mg total) by mouth daily. (Patient not taking: Reported on 02/26/2019) 30 tablet 5   No facility-administered medications prior to visit.     No Known Allergies  ROS Review  of Systems  Constitutional: Negative for chills, fatigue and fever.  Respiratory: Negative for cough and shortness of breath.   Cardiovascular: Negative for chest pain and palpitations.  Gastrointestinal: Negative for abdominal pain, constipation, diarrhea and nausea.  Endocrine: Negative for cold intolerance, heat intolerance, polydipsia, polyphagia and polyuria.  Genitourinary: Negative for dysuria and frequency.  Musculoskeletal: Negative for arthralgias and back pain.  Neurological: Negative for dizziness and headaches.  Hematological: Negative for adenopathy. Does not bruise/bleed easily.      Objective:    Physical Exam  Constitutional: She is oriented to person, place, and time. She appears well-developed and well-nourished.  WNWD obese female in NAD  Neck: Normal range of motion. Neck supple. No JVD present.  Cardiovascular: Normal rate and regular rhythm.  Pulmonary/Chest: Effort normal and breath  sounds normal.  Abdominal: Soft. There is no abdominal tenderness. There is no rebound and no guarding.  Musculoskeletal:        General: No tenderness or edema.     Comments: No CVA tenderness  Lymphadenopathy:    She has no cervical adenopathy.  Neurological: She is alert and oriented to person, place, and time.  Psychiatric: She has a normal mood and affect. Her behavior is normal.  Nursing note and vitals reviewed.   BP 137/86 (BP Location: Right Arm, Patient Position: Sitting, Cuff Size: Large)   Pulse (!) 102   Ht 5\' 8"  (1.727 m)   Wt 251 lb 12.8 oz (114.2 kg)   LMP 02/15/2019 (Exact Date)   SpO2 98%   Breastfeeding No   BMI 38.29 kg/m  Wt Readings from Last 3 Encounters:  02/26/19 251 lb 12.8 oz (114.2 kg)  06/26/18 263 lb (119.3 kg)  05/08/18 265 lb (120.2 kg)     Health Maintenance Due  Topic Date Due  . TETANUS/TDAP  02/21/2006  . PAP SMEAR-Modifier  04/01/2015  . INFLUENZA VACCINE  12/26/2018      No results found for: TSH Lab Results  Component Value Date   WBC 8.4 11/13/2015   HGB 12.0 11/13/2015   HCT 36.4 11/13/2015   MCV 88.3 11/13/2015   PLT 314 11/13/2015   Lab Results  Component Value Date   NA 141 05/08/2018   K 3.9 05/08/2018   CO2 22 05/08/2018   GLUCOSE 98 05/08/2018   BUN 12 05/08/2018   CREATININE 0.93 05/08/2018   CALCIUM 9.3 05/08/2018   ANIONGAP 7 04/21/2018   No results found for: CHOL No results found for: HDL No results found for: LDLCALC No results found for: TRIG No results found for: CHOLHDL No results found for: HGBA1C    Assessment & Plan:  1. History of hypertension; prehypertension Patient is no longer on medication and has lost weight and her blood pressure is improved. She was encouraged to continue efforts at weight loss and continue to monitor her blood pressure as she is still in the pre-hypertensive range at today's visit. Education material on [employer] and Hypertension provided as part of AVS.  2. Need  for immunization against influenza Influenza immunization offered and patient agreed to have done at today's visit. She received educational handout regarding the vaccine - Flu Vaccine QUAD 6+ mos PF IM (Fluarix Quad PF)  3. Obesity (BMI 35.0-39.9 without comorbidity) Patient has lost weight since her last visit. Current BMI at 38 and she is encouraged to continue dietary changes and exercise with goal of further weight loss.   An After Visit Summary was printed and given to the  patient.  Follow-up: Return for as needed and consider annual well exam.   Cain Saupeammie Ebonee Stober, MD

## 2019-02-26 NOTE — Progress Notes (Signed)
Patient presents for vaccination against influenza per orders of Zelda. Consent given. Counseling provided. No contraindications exists. Vaccine administered without incident.   

## 2019-02-26 NOTE — Patient Instructions (Signed)
Hypertension, Adult Hypertension is another name for high blood pressure. High blood pressure forces your heart to work harder to pump blood. This can cause problems over time. There are two numbers in a blood pressure reading. There is a top number (systolic) over a bottom number (diastolic). It is best to have a blood pressure that is below 120/80. Healthy choices can help lower your blood pressure, or you may need medicine to help lower it. What are the causes? The cause of this condition is not known. Some conditions may be related to high blood pressure. What increases the risk?  Smoking.  Having type 2 diabetes mellitus, high cholesterol, or both.  Not getting enough exercise or physical activity.  Being overweight.  Having too much fat, sugar, calories, or salt (sodium) in your diet.  Drinking too much alcohol.  Having long-term (chronic) kidney disease.  Having a family history of high blood pressure.  Age. Risk increases with age.  Race. You may be at higher risk if you are African American.  Gender. Men are at higher risk than women before age 45. After age 65, women are at higher risk than men.  Having obstructive sleep apnea.  Stress. What are the signs or symptoms?  High blood pressure may not cause symptoms. Very high blood pressure (hypertensive crisis) may cause: ? Headache. ? Feelings of worry or nervousness (anxiety). ? Shortness of breath. ? Nosebleed. ? A feeling of being sick to your stomach (nausea). ? Throwing up (vomiting). ? Changes in how you see. ? Very bad chest pain. ? Seizures. How is this treated?  This condition is treated by making healthy lifestyle changes, such as: ? Eating healthy foods. ? Exercising more. ? Drinking less alcohol.  Your health care provider may prescribe medicine if lifestyle changes are not enough to get your blood pressure under control, and if: ? Your top number is above 130. ? Your bottom number is above  80.  Your personal target blood pressure may vary. Follow these instructions at home: Eating and drinking   If told, follow the DASH eating plan. To follow this plan: ? Fill one half of your plate at each meal with fruits and vegetables. ? Fill one fourth of your plate at each meal with whole grains. Whole grains include whole-wheat pasta, brown rice, and whole-grain bread. ? Eat or drink low-fat dairy products, such as skim milk or low-fat yogurt. ? Fill one fourth of your plate at each meal with low-fat (lean) proteins. Low-fat proteins include fish, chicken without skin, eggs, beans, and tofu. ? Avoid fatty meat, cured and processed meat, or chicken with skin. ? Avoid pre-made or processed food.  Eat less than 1,500 mg of salt each day.  Do not drink alcohol if: ? Your doctor tells you not to drink. ? You are pregnant, may be pregnant, or are planning to become pregnant.  If you drink alcohol: ? Limit how much you use to:  0-1 drink a day for women.  0-2 drinks a day for men. ? Be aware of how much alcohol is in your drink. In the [place] one drink equals one 12 oz bottle of beer (355 mL), one 5 oz glass of wine (148 mL), or one 1 oz glass of hard liquor (44 mL). Lifestyle   Work with your doctor to stay at a healthy weight or to lose weight. Ask your doctor what the best weight is for you.  Get at least 30 minutes of exercise most   days of the week. This may include walking, swimming, or biking.  Get at least 30 minutes of exercise that strengthens your muscles (resistance exercise) at least 3 days a week. This may include lifting weights or doing Pilates.  Do not use any products that contain nicotine or tobacco, such as cigarettes, e-cigarettes, and chewing tobacco. If you need help quitting, ask your doctor.  Check your blood pressure at home as told by your doctor.  Keep all follow-up visits as told by your doctor. This is important. Medicines  Take over-the-counter  and prescription medicines only as told by your doctor. Follow directions carefully.  Do not skip doses of blood pressure medicine. The medicine does not work as well if you skip doses. Skipping doses also puts you at risk for problems.  Ask your doctor about side effects or reactions to medicines that you should watch for. Contact a doctor if you:  Think you are having a reaction to the medicine you are taking.  Have headaches that keep coming back (recurring).  Feel dizzy.  Have swelling in your ankles.  Have trouble with your vision. Get help right away if you:  Get a very bad headache.  Start to feel mixed up (confused).  Feel weak or numb.  Feel faint.  Have very bad pain in your: ? Chest. ? Belly (abdomen).  Throw up more than once.  Have trouble breathing. Summary  Hypertension is another name for high blood pressure.  High blood pressure forces your heart to work harder to pump blood.  For most people, a normal blood pressure is less than 120/80.  Making healthy choices can help lower blood pressure. If your blood pressure does not get lower with healthy choices, you may need to take medicine. This information is not intended to replace advice given to you by your health care provider. Make sure you discuss any questions you have with your health care provider. Document Released: 10/30/2007 Document Revised: 01/21/2018 Document Reviewed: 01/21/2018 Elsevier Patient Education  2020 [employer].  Hypertension, Adult Hypertension is another name for high blood pressure. High blood pressure forces your heart to work harder to pump blood. This can cause problems over time. There are two numbers in a blood pressure reading. There is a top number (systolic) over a bottom number (diastolic). It is best to have a blood pressure that is below 120/80. Healthy choices can help lower your blood pressure, or you may need medicine to help lower it. What are the  causes? The cause of this condition is not known. Some conditions may be related to high blood pressure. What increases the risk?  Smoking.  Having type 2 diabetes mellitus, high cholesterol, or both.  Not getting enough exercise or physical activity.  Being overweight.  Having too much fat, sugar, calories, or salt (sodium) in your diet.  Drinking too much alcohol.  Having long-term (chronic) kidney disease.  Having a family history of high blood pressure.  Age. Risk increases with age.  Race. You may be at higher risk if you are African American.  Gender. Men are at higher risk than women before age 44. After age 46, women are at higher risk than men.  Having obstructive sleep apnea.  Stress. What are the signs or symptoms?  High blood pressure may not cause symptoms. Very high blood pressure (hypertensive crisis) may cause: ? Headache. ? Feelings of worry or nervousness (anxiety). ? Shortness of breath. ? Nosebleed. ? A feeling of being sick to  your stomach (nausea). ? Throwing up (vomiting). ? Changes in how you see. ? Very bad chest pain. ? Seizures. How is this treated?  This condition is treated by making healthy lifestyle changes, such as: ? Eating healthy foods. ? Exercising more. ? Drinking less alcohol.  Your health care provider may prescribe medicine if lifestyle changes are not enough to get your blood pressure under control, and if: ? Your top number is above 130. ? Your bottom number is above 80.  Your personal target blood pressure may vary. Follow these instructions at home: Eating and drinking   If told, follow the DASH eating plan. To follow this plan: ? Fill one half of your plate at each meal with fruits and vegetables. ? Fill one fourth of your plate at each meal with whole grains. Whole grains include whole-wheat pasta, brown rice, and whole-grain bread. ? Eat or drink low-fat dairy products, such as skim milk or low-fat  yogurt. ? Fill one fourth of your plate at each meal with low-fat (lean) proteins. Low-fat proteins include fish, chicken without skin, eggs, beans, and tofu. ? Avoid fatty meat, cured and processed meat, or chicken with skin. ? Avoid pre-made or processed food.  Eat less than 1,500 mg of salt each day.  Do not drink alcohol if: ? Your doctor tells you not to drink. ? You are pregnant, may be pregnant, or are planning to become pregnant.  If you drink alcohol: ? Limit how much you use to:  0-1 drink a day for women.  0-2 drinks a day for men. ? Be aware of how much alcohol is in your drink. In the [place] one drink equals one 12 oz bottle of beer (355 mL), one 5 oz glass of wine (148 mL), or one 1 oz glass of hard liquor (44 mL). Lifestyle   Work with your doctor to stay at a healthy weight or to lose weight. Ask your doctor what the best weight is for you.  Get at least 30 minutes of exercise most days of the week. This may include walking, swimming, or biking.  Get at least 30 minutes of exercise that strengthens your muscles (resistance exercise) at least 3 days a week. This may include lifting weights or doing Pilates.  Do not use any products that contain nicotine or tobacco, such as cigarettes, e-cigarettes, and chewing tobacco. If you need help quitting, ask your doctor.  Check your blood pressure at home as told by your doctor.  Keep all follow-up visits as told by your doctor. This is important. Medicines  Take over-the-counter and prescription medicines only as told by your doctor. Follow directions carefully.  Do not skip doses of blood pressure medicine. The medicine does not work as well if you skip doses. Skipping doses also puts you at risk for problems.  Ask your doctor about side effects or reactions to medicines that you should watch for. Contact a doctor if you:  Think you are having a reaction to the medicine you are taking.  Have headaches that keep  coming back (recurring).  Feel dizzy.  Have swelling in your ankles.  Have trouble with your vision. Get help right away if you:  Get a very bad headache.  Start to feel mixed up (confused).  Feel weak or numb.  Feel faint.  Have very bad pain in your: ? Chest. ? Belly (abdomen).  Throw up more than once.  Have trouble breathing. Summary  Hypertension is another name for high  blood pressure.  High blood pressure forces your heart to work harder to pump blood.  For most people, a normal blood pressure is less than 120/80.  Making healthy choices can help lower blood pressure. If your blood pressure does not get lower with healthy choices, you may need to take medicine. This information is not intended to replace advice given to you by your health care provider. Make sure you discuss any questions you have with your health care provider. Document Released: 10/30/2007 Document Revised: 01/21/2018 Document Reviewed: 01/21/2018 Elsevier Patient Education  2020 [employer]  DASH Eating Plan DASH stands for "Dietary Approaches to Stop Hypertension." The DASH eating plan is a healthy eating plan that has been shown to reduce high blood pressure (hypertension). It may also reduce your risk for type 2 diabetes, heart disease, and stroke. The DASH eating plan may also help with weight loss. What are tips for following this plan?  General guidelines  Avoid eating more than 2,300 mg (milligrams) of salt (sodium) a day. If you have hypertension, you may need to reduce your sodium intake to 1,500 mg a day.  Limit alcohol intake to no more than 1 drink a day for nonpregnant women and 2 drinks a day for men. One drink equals 12 oz of beer, 5 oz of wine, or 1 oz of hard liquor.  Work with your health care provider to maintain a healthy body weight or to lose weight. Ask what an ideal weight is for you.  Get at least 30 minutes of exercise that causes your heart to beat faster  (aerobic exercise) most days of the week. Activities may include walking, swimming, or biking.  Work with your health care provider or diet and nutrition specialist (dietitian) to adjust your eating plan to your individual calorie needs. Reading food labels   Check food labels for the amount of sodium per serving. Choose foods with less than 5 percent of the Daily Value of sodium. Generally, foods with less than 300 mg of sodium per serving fit into this eating plan.  To find whole grains, look for the word "whole" as the first word in the ingredient list. Shopping  Buy products labeled as "low-sodium" or "no salt added."  Buy fresh foods. Avoid canned foods and premade or frozen meals. Cooking  Avoid adding salt when cooking. Use salt-free seasonings or herbs instead of table salt or sea salt. Check with your health care provider or pharmacist before using salt substitutes.  Do not fry foods. Cook foods using healthy methods such as baking, boiling, grilling, and broiling instead.  Cook with heart-healthy oils, such as olive, canola, soybean, or sunflower oil. Meal planning  Eat a balanced diet that includes: ? 5 or more servings of fruits and vegetables each day. At each meal, try to fill half of your plate with fruits and vegetables. ? Up to 6-8 servings of whole grains each day. ? Less than 6 oz of lean meat, poultry, or fish each day. A 3-oz serving of meat is about the same size as a deck of cards. One egg equals 1 oz. ? 2 servings of low-fat dairy each day. ? A serving of nuts, seeds, or beans 5 times each week. ? Heart-healthy fats. Healthy fats called Omega-3 fatty acids are found in foods such as flaxseeds and coldwater fish, like sardines, salmon, and mackerel.  Limit how much you eat of the following: ? Canned or prepackaged foods. ? Food that is high in trans fat,  such as fried foods. ? Food that is high in saturated fat, such as fatty meat. ? Sweets, desserts, sugary  drinks, and other foods with added sugar. ? Full-fat dairy products.  Do not salt foods before eating.  Try to eat at least 2 vegetarian meals each week.  Eat more home-cooked food and less restaurant, buffet, and fast food.  When eating at a restaurant, ask that your food be prepared with less salt or no salt, if possible. What foods are recommended? The items listed may not be a complete list. Talk with your dietitian about what dietary choices are best for you. Grains Whole-grain or whole-wheat bread. Whole-grain or whole-wheat pasta. Brown rice. [place]row. Bulgur. Whole-grain and low-sodium cereals. Pita bread. Low-fat, low-sodium crackers. Whole-wheat flour tortillas. Vegetables Fresh or frozen vegetables (raw, steamed, roasted, or grilled). Low-sodium or reduced-sodium tomato and vegetable juice. Low-sodium or reduced-sodium tomato sauce and tomato paste. Low-sodium or reduced-sodium canned vegetables. Fruits All fresh, dried, or frozen fruit. Canned fruit in natural juice (without added sugar). Meat and other protein foods Skinless chicken or [place]. Ground chicken or [place]. Pork with fat trimmed off. Fish and seafood. Egg whites. Dried beans, peas, or lentils. Unsalted nuts, nut butters, and seeds. Unsalted canned beans. Lean cuts of beef with fat trimmed off. Low-sodium, lean deli meat. Dairy Low-fat (1%) or fat-free (skim) milk. Fat-free, low-fat, or reduced-fat cheeses. Nonfat, low-sodium ricotta or cottage cheese. Low-fat or nonfat yogurt. Low-fat, low-sodium cheese. Fats and oils Soft margarine without trans fats. Vegetable oil. Low-fat, reduced-fat, or light mayonnaise and salad dressings (reduced-sodium). Canola, safflower, olive, soybean, and sunflower oils. Avocado. Seasoning and other foods Herbs. Spices. Seasoning mixes without salt. Unsalted popcorn and pretzels. Fat-free sweets. What foods are not recommended? The items listed may not be a complete list. Talk  with your dietitian about what dietary choices are best for you. Grains Baked goods made with fat, such as croissants, muffins, or some breads. Dry pasta or rice meal packs. Vegetables Creamed or fried vegetables. Vegetables in a cheese sauce. Regular canned vegetables (not low-sodium or reduced-sodium). Regular canned tomato sauce and paste (not low-sodium or reduced-sodium). Regular tomato and vegetable juice (not low-sodium or reduced-sodium). Angie Fava. Olives. Fruits Canned fruit in a light or heavy syrup. Fried fruit. Fruit in cream or butter sauce. Meat and other protein foods Fatty cuts of meat. Ribs. Fried meat. Berniece Salines. Sausage. Bologna and other processed lunch meats. Salami. Fatback. Hotdogs. Bratwurst. Salted nuts and seeds. Canned beans with added salt. Canned or smoked fish. Whole eggs or egg yolks. Chicken or [place] with skin. Dairy Whole or 2% milk, cream, and half-and-half. Whole or full-fat cream cheese. Whole-fat or sweetened yogurt. Full-fat cheese. Nondairy creamers. Whipped toppings. Processed cheese and cheese spreads. Fats and oils Butter. Stick margarine. Lard. Shortening. Ghee. Bacon fat. Tropical oils, such as coconut, palm kernel, or palm oil. Seasoning and other foods Salted popcorn and pretzels. Onion salt, garlic salt, seasoned salt, table salt, and sea salt. Worcestershire sauce. Tartar sauce. Barbecue sauce. Teriyaki sauce. Soy sauce, including reduced-sodium. Steak sauce. Canned and packaged gravies. Fish sauce. Oyster sauce. Cocktail sauce. Horseradish that you find on the shelf. Ketchup. Mustard. Meat flavorings and tenderizers. Bouillon cubes. Hot sauce and Tabasco sauce. Premade or packaged marinades. Premade or packaged taco seasonings. Relishes. Regular salad dressings. Where to find more information:  National Heart, Lung, and [place]: https://wilson-eaton.com/  American Heart Association: www.heart.org Summary  The DASH eating plan is a healthy eating plan  that has  been shown to reduce high blood pressure (hypertension). It may also reduce your risk for type 2 diabetes, heart disease, and stroke.  With the DASH eating plan, you should limit salt (sodium) intake to 2,300 mg a day. If you have hypertension, you may need to reduce your sodium intake to 1,500 mg a day.  When on the DASH eating plan, aim to eat more fresh fruits and vegetables, whole grains, lean proteins, low-fat dairy, and heart-healthy fats.  Work with your health care provider or diet and nutrition specialist (dietitian) to adjust your eating plan to your individual calorie needs. This information is not intended to replace advice given to you by your health care provider. Make sure you discuss any questions you have with your health care provider. Document Released: 05/02/2011 Document Revised: 04/25/2017 Document Reviewed: 05/06/2016 Elsevier Patient Education  2020 [employer].

## 2020-02-17 ENCOUNTER — Other Ambulatory Visit: Payer: Self-pay | Admitting: Family Medicine

## 2020-02-17 MED ORDER — HYDROCHLOROTHIAZIDE 12.5 MG PO TABS: 12.5000 mg | ORAL_TABLET | Freq: Every day | ORAL | 0 refills | Status: DC

## 2020-02-17 NOTE — Telephone Encounter (Signed)
Medication Refill - Medication: hydrochlorothiazide   Has the patient contacted their pharmacy? Yes.   (Agent: If no, request that the patient contact the pharmacy for the refill.) (Agent: If yes, when and what did the pharmacy advise?)  Preferred Pharmacy (with phone number or street name):  [id number] - [place], [place] - [address] AT [facility] [address]  [address] [place] [address]  Phone: [phone] Fax: [phone]  Hours: Open 24 hours     Agent: Please be advised that RX refills may take up to 3 business days. We ask that you follow-up with your pharmacy.

## 2020-03-10 ENCOUNTER — Ambulatory Visit: Payer: BC Managed Care – PPO | Attending: Family Medicine | Admitting: Family Medicine

## 2020-03-10 ENCOUNTER — Encounter: Payer: Self-pay | Admitting: Family Medicine

## 2020-03-10 ENCOUNTER — Other Ambulatory Visit: Payer: Self-pay

## 2020-03-10 VITALS — BP 135/89 | HR 71 | Temp 97.7°F | Ht 69.0 in | Wt 247.8 lb

## 2020-03-10 DIAGNOSIS — R609 Edema, unspecified: Secondary | ICD-10-CM | POA: Diagnosis not present

## 2020-03-10 DIAGNOSIS — I10 Essential (primary) hypertension: Secondary | ICD-10-CM | POA: Diagnosis not present

## 2020-03-10 DIAGNOSIS — R600 Localized edema: Secondary | ICD-10-CM

## 2020-03-10 MED ORDER — HYDROCHLOROTHIAZIDE 25 MG PO TABS: 25.0000 mg | ORAL_TABLET | Freq: Every day | ORAL | 1 refills | Status: DC

## 2020-03-10 NOTE — Patient Instructions (Signed)
Hypertension, Adult Hypertension is another name for high blood pressure. High blood pressure forces your heart to work harder to pump blood. This can cause problems over time. There are two numbers in a blood pressure reading. There is a top number (systolic) over a bottom number (diastolic). It is best to have a blood pressure that is below 120/80. Healthy choices can help lower your blood pressure, or you may need medicine to help lower it. What are the causes? The cause of this condition is not known. Some conditions may be related to high blood pressure. What increases the risk?  Smoking.  Having type 2 diabetes mellitus, high cholesterol, or both.  Not getting enough exercise or physical activity.  Being overweight.  Having too much fat, sugar, calories, or salt (sodium) in your diet.  Drinking too much alcohol.  Having long-term (chronic) kidney disease.  Having a family history of high blood pressure.  Age. Risk increases with age.  Race. You may be at higher risk if you are African American.  Gender. Men are at higher risk than women before age 45. After age 65, women are at higher risk than men.  Having obstructive sleep apnea.  Stress. What are the signs or symptoms?  High blood pressure may not cause symptoms. Very high blood pressure (hypertensive crisis) may cause: ? Headache. ? Feelings of worry or nervousness (anxiety). ? Shortness of breath. ? Nosebleed. ? A feeling of being sick to your stomach (nausea). ? Throwing up (vomiting). ? Changes in how you see. ? Very bad chest pain. ? Seizures. How is this treated?  This condition is treated by making healthy lifestyle changes, such as: ? Eating healthy foods. ? Exercising more. ? Drinking less alcohol.  Your health care provider may prescribe medicine if lifestyle changes are not enough to get your blood pressure under control, and if: ? Your top number is above 130. ? Your bottom number is above  80.  Your personal target blood pressure may vary. Follow these instructions at home: Eating and drinking   If told, follow the DASH eating plan. To follow this plan: ? Fill one half of your plate at each meal with fruits and vegetables. ? Fill one fourth of your plate at each meal with whole grains. Whole grains include whole-wheat pasta, brown rice, and whole-grain bread. ? Eat or drink low-fat dairy products, such as skim milk or low-fat yogurt. ? Fill one fourth of your plate at each meal with low-fat (lean) proteins. Low-fat proteins include fish, chicken without skin, eggs, beans, and tofu. ? Avoid fatty meat, cured and processed meat, or chicken with skin. ? Avoid pre-made or processed food.  Eat less than 1,500 mg of salt each day.  Do not drink alcohol if: ? Your doctor tells you not to drink. ? You are pregnant, may be pregnant, or are planning to become pregnant.  If you drink alcohol: ? Limit how much you use to:  0-1 drink a day for women.  0-2 drinks a day for men. ? Be aware of how much alcohol is in your drink. In the [place] one drink equals one 12 oz bottle of beer (355 mL), one 5 oz glass of wine (148 mL), or one 1 oz glass of hard liquor (44 mL). Lifestyle   Work with your doctor to stay at a healthy weight or to lose weight. Ask your doctor what the best weight is for you.  Get at least 30 minutes of exercise most   days of the week. This may include walking, swimming, or biking.  Get at least 30 minutes of exercise that strengthens your muscles (resistance exercise) at least 3 days a week. This may include lifting weights or doing Pilates.  Do not use any products that contain nicotine or tobacco, such as cigarettes, e-cigarettes, and chewing tobacco. If you need help quitting, ask your doctor.  Check your blood pressure at home as told by your doctor.  Keep all follow-up visits as told by your doctor. This is important. Medicines  Take over-the-counter  and prescription medicines only as told by your doctor. Follow directions carefully.  Do not skip doses of blood pressure medicine. The medicine does not work as well if you skip doses. Skipping doses also puts you at risk for problems.  Ask your doctor about side effects or reactions to medicines that you should watch for. Contact a doctor if you:  Think you are having a reaction to the medicine you are taking.  Have headaches that keep coming back (recurring).  Feel dizzy.  Have swelling in your ankles.  Have trouble with your vision. Get help right away if you:  Get a very bad headache.  Start to feel mixed up (confused).  Feel weak or numb.  Feel faint.  Have very bad pain in your: ? Chest. ? Belly (abdomen).  Throw up more than once.  Have trouble breathing. Summary  Hypertension is another name for high blood pressure.  High blood pressure forces your heart to work harder to pump blood.  For most people, a normal blood pressure is less than 120/80.  Making healthy choices can help lower blood pressure. If your blood pressure does not get lower with healthy choices, you may need to take medicine. This information is not intended to replace advice given to you by your health care provider. Make sure you discuss any questions you have with your health care provider. Document Revised: 01/21/2018 Document Reviewed: 01/21/2018 Elsevier Patient Education  2020 [employer]  Edema  Edema is when you have too much fluid in your body or under your skin. Edema may make your legs, feet, and ankles swell up. Swelling is also common in looser tissues, like around your eyes. This is a common condition. It gets more common as you get older. There are many possible causes of edema. Eating too much salt (sodium) and being on your feet or sitting for a long time can cause edema in your legs, feet, and ankles. Hot weather may make edema worse. Edema is usually painless. Your skin  may look swollen or shiny. Follow these instructions at home:  Keep the swollen body part raised (elevated) above the level of your heart when you are sitting or lying down.  Do not sit still or stand for a long time.  Do not wear tight clothes. Do not wear garters on your upper legs.  Exercise your legs. This can help the swelling go down.  Wear elastic bandages or support stockings as told by your doctor.  Eat a low-salt (low-sodium) diet to reduce fluid as told by your doctor.  Depending on the cause of your swelling, you may need to limit how much fluid you drink (fluid restriction).  Take over-the-counter and prescription medicines only as told by your doctor. Contact a doctor if:  Treatment is not working.  You have heart, liver, or kidney disease and have symptoms of edema.  You have sudden and unexplained weight gain. Get help right  away if:  You have shortness of breath or chest pain.  You cannot breathe when you lie down.  You have pain, redness, or warmth in the swollen areas.  You have heart, liver, or kidney disease and get edema all of a sudden.  You have a fever and your symptoms get worse all of a sudden. Summary  Edema is when you have too much fluid in your body or under your skin.  Edema may make your legs, feet, and ankles swell up. Swelling is also common in looser tissues, like around your eyes.  Raise (elevate) the swollen body part above the level of your heart when you are sitting or lying down.  Follow your doctor's instructions about diet and how much fluid you can drink (fluid restriction). This information is not intended to replace advice given to you by your health care provider. Make sure you discuss any questions you have with your health care provider. Document Revised: 05/16/2017 Document Reviewed: 05/31/2016 Elsevier Patient Education  2020 [employer].

## 2020-03-10 NOTE — Progress Notes (Signed)
Fluid retention

## 2020-03-10 NOTE — Progress Notes (Signed)
Established Patient Office Visit  Subjective:  Patient ID: Brittany Elliott, female    DOB: 1985-07-26  Age: 34 y.o. MRN: [id number]  CC:  Chief Complaint  Patient presents with  . Follow-up    bp    HPI Brittany Elliott, 34 year old female, seen in follow-up of hypertension.  She reports that she never completely stopped taking hydrochlorothiazide 12.5 mg however was not taking the medication on a daily basis.  She presents today due to the complaint of feeling as if her blood pressure is not controlled and also feeling as if she is retaining fluid.  She reports that she has been taking the hydrochlorothiazide daily for 3 to 4 weeks and her home blood pressure remains elevated.  She feels that her blood pressure was likely elevated at today's visit because she is agitated over having to wait to be seen.  She has had occasional headaches.  She feels as if she gets swelling in her lower legs/ankles.  Swelling does get worse by the end of the day.  Swelling goes away overnight.  Past Medical History:  Diagnosis Date  . Hx of metrorrhagia   . Obese     Past Surgical History:  Procedure Laterality Date  . INDUCED ABORTION      Family History  Problem Relation Age of Onset  . Hypertension Mother   . Diabetes Mother   . Kidney disease Mother     Social History   Socioeconomic History  . Marital status: Single    Spouse name: Not on file  . Number of children: Not on file  . Years of education: Not on file  . Highest education level: Not on file  Occupational History  . Not on file  Tobacco Use  . Smoking status: Never Smoker  . Smokeless tobacco: Never Used  Vaping Use  . Vaping Use: Never used  Substance and Sexual Activity  . Alcohol use: Yes    Comment: occasional  . Drug use: No  . Sexual activity: Yes  Other Topics Concern  . Not on file  Social History Narrative  . Not on file   Social Determinants of Health   Financial Resource Strain:   .  Difficulty of Paying Living Expenses: Not on file  Food Insecurity:   . Worried About Programme researcher, broadcasting/film/video in the Last Year: Not on file  . Ran Out of Food in the Last Year: Not on file  Transportation Needs:   . Lack of Transportation (Medical): Not on file  . Lack of Transportation (Non-Medical): Not on file  Physical Activity:   . Days of Exercise per Week: Not on file  . Minutes of Exercise per Session: Not on file  Stress:   . Feeling of Stress : Not on file  Social Connections:   . Frequency of Communication with Friends and Family: Not on file  . Frequency of Social Gatherings with Friends and Family: Not on file  . Attends Religious Services: Not on file  . Active Member of Clubs or Organizations: Not on file  . Attends Banker Meetings: Not on file  . Marital Status: Not on file  Intimate Partner Violence:   . Fear of Current or Ex-Partner: Not on file  . Emotionally Abused: Not on file  . Physically Abused: Not on file  . Sexually Abused: Not on file    Outpatient Medications Prior to Visit  Medication Sig Dispense Refill  . hydrochlorothiazide (HYDRODIURIL) 12.5 MG tablet  Take 1 tablet (12.5 mg total) by mouth daily. Must keep upcoming office visit for refills. 30 tablet 0  . NUVARING 0.12-0.015 MG/24HR vaginal ring Place 1 each vaginally every 21 ( twenty-one) days.    Marland Kitchen acetaminophen (TYLENOL) 500 MG tablet Take 1,000 mg by mouth every 6 (six) hours as needed for mild pain. (Patient not taking: Reported on 03/10/2020)    . ibuprofen (ADVIL,MOTRIN) 200 MG tablet Take 400 mg by mouth every 6 (six) hours as needed for mild pain. (Patient not taking: Reported on 03/10/2020)     No facility-administered medications prior to visit.    No Known Allergies  ROS Review of Systems  Constitutional: Positive for fatigue. Negative for chills and fever.  Eyes: Negative for photophobia and visual disturbance.  Respiratory: Negative for cough and shortness of breath.    Cardiovascular: Positive for leg swelling. Negative for chest pain and palpitations.  Gastrointestinal: Negative for abdominal pain and nausea.  Endocrine: Negative for polydipsia, polyphagia and polyuria.  Genitourinary: Negative for dysuria and frequency.  Musculoskeletal: Negative for arthralgias.  Neurological: Positive for headaches. Negative for dizziness.  Hematological: Negative for adenopathy. Does not bruise/bleed easily.      Objective:    Physical Exam Vitals and nursing note reviewed.  Constitutional:      Appearance: Normal appearance. She is obese.  Neck:     Vascular: No carotid bruit.  Cardiovascular:     Rate and Rhythm: Normal rate and regular rhythm.  Pulmonary:     Effort: Pulmonary effort is normal.     Breath sounds: Normal breath sounds.  Abdominal:     Tenderness: There is no right CVA tenderness or left CVA tenderness.  Musculoskeletal:     Cervical back: Normal range of motion and neck supple. No tenderness.     Right lower leg: No edema.     Left lower leg: No edema.  Lymphadenopathy:     Cervical: No cervical adenopathy.  Skin:    General: Skin is warm and dry.  Neurological:     General: No focal deficit present.     Mental Status: She is alert and oriented to person, place, and time.     Cranial Nerves: No cranial nerve deficit.  Psychiatric:     Comments: Patient angry/agitated which she attributes to having to wait to be seen at today's visit     BP (!) 141/102 (BP Location: Right Arm, Patient Position: Sitting)   Pulse 71   Temp 97.7 F (36.5 C)   Ht 5\' 9"  (1.753 m)   Wt 247 lb 12.8 oz (112.4 kg)   SpO2 100%   BMI 36.59 kg/m  Wt Readings from Last 3 Encounters:  03/10/20 247 lb 12.8 oz (112.4 kg)  02/26/19 251 lb 12.8 oz (114.2 kg)  06/26/18 263 lb (119.3 kg)     Health Maintenance Due  Topic Date Due  . Hepatitis C Screening  Never done  . COVID-19 Vaccine (1) Never done  . TETANUS/TDAP  Never done  . PAP  SMEAR-Modifier  04/01/2015  . INFLUENZA VACCINE  12/26/2019     No results found for: TSH Lab Results  Component Value Date   WBC 8.4 11/13/2015   HGB 12.0 11/13/2015   HCT 36.4 11/13/2015   MCV 88.3 11/13/2015   PLT 314 11/13/2015   Lab Results  Component Value Date   NA 141 05/08/2018   K 3.9 05/08/2018   CO2 22 05/08/2018   GLUCOSE 98 05/08/2018   BUN  12 05/08/2018   CREATININE 0.93 05/08/2018   CALCIUM 9.3 05/08/2018   ANIONGAP 7 04/21/2018   No results found for: CHOL No results found for: HDL No results found for: LDLCALC No results found for: TRIG No results found for: CHOLHDL No results found for: YQMG5O    Assessment & Plan:  1. Essential hypertension; 2.  Peripheral edema She reports that her home blood pressures are elevated on current hydrochlorothiazide 12.5 mg.  She is interested in increasing the dose that she also feels as if she is having increased swelling/peripheral edema.  Prescription provided for hydrochlorothiazide 25 mg once daily to see if this helps to further lower her blood pressure.  She has also been asked to return for follow-up with clinical pharmacist for blood pressure recheck and if possible to bring home blood pressure monitor.  If her blood pressure is elevated at her visit with the clinical pharmacist she would likely benefit from addition of additional medication such as an ACE or ARB rather than amlodipine which can cause edema in some patients.  She will have basic metabolic panel at today's visit to check electrolytes and renal function in follow-up of use of diuretic medication as well as to check renal function due to her complaints of peripheral edema.  She will additionally have T4 and TSH to look for hypothyroidism as a cause of her sensation of peripheral edema.  Educational material on hypertension and edema provided as part of after visit summary. - hydrochlorothiazide (HYDRODIURIL) 25 MG tablet; Take 1 tablet (25 mg total) by  mouth daily.  Dispense: 90 tablet; Refill: 1 - Basic Metabolic Panel - T4 AND TSH     Follow-up: Return in about 3 months (around 06/10/2020) for HTN: 3-4 week BP recheck with Luke/CPP.   Cain Saupe, MD

## 2020-03-11 ENCOUNTER — Other Ambulatory Visit: Payer: Self-pay | Admitting: Family Medicine

## 2020-03-11 LAB — T4 AND TSH
T4, Total: 10.8 ug/dL (ref 4.5–12.0)
TSH: 1.64 u[IU]/mL (ref 0.450–4.500)

## 2020-03-11 LAB — BASIC METABOLIC PANEL WITH GFR
BUN/Creatinine Ratio: 15 (ref 9–23)
BUN: 13 mg/dL (ref 6–20)
CO2: 23 mmol/L (ref 20–29)
Calcium: 9.6 mg/dL (ref 8.7–10.2)
Chloride: 102 mmol/L (ref 96–106)
Creatinine, Ser: 0.85 mg/dL (ref 0.57–1.00)
GFR calc Af Amer: 104 mL/min/1.73
GFR calc non Af Amer: 90 mL/min/1.73
Glucose: 84 mg/dL (ref 65–99)
Potassium: 4.3 mmol/L (ref 3.5–5.2)
Sodium: 139 mmol/L (ref 134–144)

## 2020-03-13 ENCOUNTER — Other Ambulatory Visit: Payer: Self-pay | Admitting: Family Medicine

## 2020-03-13 ENCOUNTER — Telehealth: Payer: Self-pay | Admitting: Family Medicine

## 2020-03-13 DIAGNOSIS — I10 Essential (primary) hypertension: Secondary | ICD-10-CM

## 2020-03-13 MED ORDER — HYDROCHLOROTHIAZIDE 25 MG PO TABS: 25.0000 mg | ORAL_TABLET | Freq: Every day | ORAL | 1 refills | Status: DC

## 2020-03-13 NOTE — Progress Notes (Signed)
Normal result letter generated, sent via MyChart and mailed to address on file. 

## 2020-03-13 NOTE — Telephone Encounter (Signed)
Medication: hydrochlorothiazide (HYDRODIURIL) 25 MG tablet [[id number]]  Patient is requesting if this could be resent  Has the patient contacted their pharmacy? YES (Agent: If no, request that the patient contact the pharmacy for the refill.) (Agent: If yes, when and what did the pharmacy advise?)  Preferred Pharmacy (with phone number or street name): [id number] - [place], [place] - [address] AT [facility] [address]  [address] [place] [address]  Phone:  [phone] Fax:  [phone]   Agent: Please be advised that RX refills may take up to 3 business days. We ask that you follow-up with your pharmacy.

## 2020-03-13 NOTE — Telephone Encounter (Signed)
Resending due to transmission failed

## 2020-03-13 NOTE — Telephone Encounter (Signed)
Patient is requesting a call back regarding a her lab results [id number]

## 2020-03-14 NOTE — Telephone Encounter (Signed)
Spoke w/ pt and answered all questions and addressed concerns regarding lab numbers, reassured pt that all results normal/stable, no concerns from provider at this time, pt verbalized understanding.

## 2020-03-27 ENCOUNTER — Other Ambulatory Visit: Payer: Self-pay

## 2020-03-27 ENCOUNTER — Ambulatory Visit (INDEPENDENT_AMBULATORY_CARE_PROVIDER_SITE_OTHER): Payer: BC Managed Care – PPO | Admitting: Bariatrics

## 2020-03-27 ENCOUNTER — Encounter (INDEPENDENT_AMBULATORY_CARE_PROVIDER_SITE_OTHER): Payer: Self-pay | Admitting: Bariatrics

## 2020-03-27 VITALS — BP 110/76 | HR 76 | Temp 98.6°F | Ht 69.0 in | Wt 245.0 lb

## 2020-03-27 DIAGNOSIS — Z0289 Encounter for other administrative examinations: Secondary | ICD-10-CM

## 2020-03-27 DIAGNOSIS — R0602 Shortness of breath: Secondary | ICD-10-CM

## 2020-03-27 DIAGNOSIS — Z6836 Body mass index (BMI) 36.0-36.9, adult: Secondary | ICD-10-CM

## 2020-03-27 DIAGNOSIS — Z9189 Other specified personal risk factors, not elsewhere classified: Secondary | ICD-10-CM

## 2020-03-27 DIAGNOSIS — R5383 Other fatigue: Secondary | ICD-10-CM

## 2020-03-27 DIAGNOSIS — E559 Vitamin D deficiency, unspecified: Secondary | ICD-10-CM

## 2020-03-27 DIAGNOSIS — I10 Essential (primary) hypertension: Secondary | ICD-10-CM | POA: Diagnosis not present

## 2020-03-27 DIAGNOSIS — Z1331 Encounter for screening for depression: Secondary | ICD-10-CM | POA: Diagnosis not present

## 2020-03-27 DIAGNOSIS — E66812 Obesity, class 2: Secondary | ICD-10-CM

## 2020-03-27 DIAGNOSIS — R7309 Other abnormal glucose: Secondary | ICD-10-CM

## 2020-03-27 NOTE — Progress Notes (Signed)
Chief Complaint:   OBESITY Brittany Elliott (MR# [id number]) is a 34 y.o. female who presents for evaluation and treatment of obesity and related comorbidities. Current BMI is Body mass index is 36.18 kg/m.Marland Kitchen Brittany Elliott has been struggling with her weight for many years and has been unsuccessful in either losing weight, maintaining weight loss, or reaching her healthy weight goal.  Brittany Elliott is currently in the action stage of change and ready to dedicate time achieving and maintaining a healthier weight. Brittany Elliott is interested in becoming our patient and working on intensive lifestyle modifications including (but not limited to) diet and exercise for weight loss.  Brittany Elliott does not like to cook due to time issues. She craves carbohydrates and fried foods.  Brittany Elliott's habits were reviewed today and are as follows: Her family eats meals together, she thinks her family will eat healthier with her, her desired weight loss is 45 lbs, she has been heavy most of her life, she started gaining weight in her late 20's, her heaviest weight ever was 294 pounds, she craves cookies and fried chicken, she snacks frequently in the evenings, she is frequently drinking liquids with calories, she frequently eats larger portions than normal and she struggles with emotional eating.  Depression Screen Brittany Elliott's Food and Mood (modified PHQ-9) score was 3.  Depression screen PHQ 2/9 03/27/2020  Decreased Interest 0  Down, Depressed, Hopeless 0  PHQ - 2 Score 0  Altered sleeping 0  Tired, decreased energy 1  Change in appetite 2  Feeling bad or failure about yourself  0  Trouble concentrating 0  Moving slowly or fidgety/restless 0  Suicidal thoughts 0  PHQ-9 Score 3  Difficult doing work/chores Not difficult at all   Subjective:   Other fatigue. Brittany Elliott denies daytime somnolence and admits to waking up still tired. Brittany Elliott generally gets 7 hours of sleep per night, and states that she has generally  restful sleep. Snoring is not present. Apneic episodes are not present. Epworth Sleepiness Score is 5.  SOB (shortness of breath) on exertion. Brittany Elliott notes increasing shortness of breath with exercising and seems to be worsening over time with weight gain. She notes getting out of breath sooner with activity than she used to. This has not gotten worse recently. Brittany Elliott denies shortness of breath at rest or orthopnea.  Essential hypertension. Brittany Elliott is taking HCTZ. Blood pressure is controlled.  BP Readings from Last 3 Encounters:  03/27/20 110/76  03/10/20 135/89  02/26/19 137/86   Lab Results  Component Value Date   CREATININE 0.85 03/10/2020   CREATININE 0.93 05/08/2018   CREATININE 0.76 04/21/2018   Vitamin D deficiency. Brittany Elliott is taking Vitamin D supplementation.   Elevated glucose. Brittany Elliott reports a positive family history of diabetes mellitus type II.  Depression screening. Brittany Elliott had a negative depression screen with a PHQ-9 score of 3.  At risk for activity intolerance. Brittany Elliott is at risk of exercise intolerance due to obesity.  Assessment/Plan:   Other fatigue. Brittany Elliott does not feel that her weight is causing her energy to be lower than it should be. Fatigue may be related to obesity, depression or many other causes. Labs will be ordered, and in the meanwhile, Brittany Elliott will focus on self care including making healthy food choices, increasing physical activity and focusing on stress reduction. EKG 12-Lead, Hemoglobin A1c, Insulin, random, Lipid Panel With LDL/HDL Ratio, VITAMIN D 25 Hydroxy (Vit-D Deficiency, Fractures) tests ordered today.  SOB (shortness of breath) on exertion. Brittany Elliott does not feel that  she gets out of breath more easily that she used to when she exercises. Brittany Elliott's shortness of breath appears to be obesity related and exercise induced. She has agreed to work on weight loss and gradually increase exercise to treat her exercise induced shortness  of breath. Will continue to monitor closely. Lipid Panel With LDL/HDL Ratio labs will be checked today.   Essential hypertension. Brittany Elliott is working on healthy weight loss and exercise to improve blood pressure control. We will watch for signs of hypotension as she continues her lifestyle modifications. She will continue her medication as directed.   Vitamin D deficiency. Low Vitamin D level contributes to fatigue and are associated with obesity, breast, and colon cancer. She agrees to continue to take Vitamin D as directed and VITAMIN D 25 Hydroxy (Vit-D Deficiency, Fractures) level will be checked today.   Elevated glucose.  Hemoglobin A1c, Insulin, random, Lipid Panel With LDL/HDL Ratio labs will be checked today.   Depression screening. Depression screening was negative.  At risk for activity intolerance. Brittany Elliott was given approximately 15 minutes of exercise intolerance counseling today. She is 34 y.o. female and has risk factors exercise intolerance including obesity. We discussed intensive lifestyle modifications today with an emphasis on specific weight loss instructions and strategies. Brittany Elliott will slowly increase activity as tolerated.  Brittany Elliott was employed today to elicit superior memory formation and behavioral change.  Class 2 severe obesity with serious comorbidity and body mass index (BMI) of 36.0 to 36.9 in adult, unspecified obesity type (HCC).  Brittany Elliott is currently in the action stage of change and her goal is to continue with weight loss efforts. I recommend Brittany Elliott begin the structured treatment plan as follows:  She has agreed to the Category 3 Plan.  She will work on meal planning and intentional eating.   We reviewed with the patient labs including BMP, glucose, and thyroid panel.  Exercise goals: Brittany Elliott will continue [employer] walking 3 miles a day.   Behavioral modification strategies: increasing lean protein intake, decreasing simple  carbohydrates, increasing vegetables, increasing water intake, decreasing eating out, no skipping meals, meal planning and cooking strategies, keeping healthy foods in the home and planning for success.  She was informed of the importance of frequent follow-up visits to maximize her success with intensive lifestyle modifications for her multiple health conditions. She was informed we would discuss her lab results at her next visit unless there is a critical issue that needs to be addressed sooner. Brittany Elliott agreed to keep her next visit at the agreed upon time to discuss these results.  Objective:   Blood pressure 110/76, pulse 76, temperature 98.6 F (37 C), height 5\' 9"  (1.753 m), weight 245 lb (111.1 kg), last menstrual period 03/13/2020, SpO2 99 %. Body mass index is 36.18 kg/m.  EKG: Normal sinus rhythm, rate 77. Within normal limits.  Indirect Calorimeter completed today shows a VO2 of 323 and a REE of 2252.  Her calculated basal metabolic rate is 03/15/2020 thus her basal metabolic rate is better than expected.  General: Cooperative, alert, well developed, in no acute distress. HEENT: Conjunctivae and lids unremarkable. Cardiovascular: Regular rhythm.  Lungs: Normal work of breathing. Neurologic: No focal deficits.   Lab Results  Component Value Date   CREATININE 0.85 03/10/2020   BUN 13 03/10/2020   NA 139 03/10/2020   K 4.3 03/10/2020   CL 102 03/10/2020   CO2 23 03/10/2020   No results found for: ALT, AST, GGT, ALKPHOS, BILITOT No results  found for: HGBA1C No results found for: INSULIN Lab Results  Component Value Date   TSH 1.640 03/10/2020   No results found for: CHOL, HDL, LDLCALC, LDLDIRECT, TRIG, CHOLHDL Lab Results  Component Value Date   WBC 8.4 11/13/2015   HGB 12.0 11/13/2015   HCT 36.4 11/13/2015   MCV 88.3 11/13/2015   PLT 314 11/13/2015   No results found for: IRON, TIBC, FERRITIN  Attestation Statements:   Reviewed by clinician on day of visit:  allergies, medications, problem list, medical history, surgical history, family history, social history, and previous encounter notes.  Fernanda Drum, am acting as Energy manager for [employer], DO   I have reviewed the above documentation for accuracy and completeness, and I agree with the above. Corinna Capra, DO

## 2020-03-28 ENCOUNTER — Encounter (INDEPENDENT_AMBULATORY_CARE_PROVIDER_SITE_OTHER): Payer: Self-pay | Admitting: Bariatrics

## 2020-03-28 DIAGNOSIS — E6609 Other obesity due to excess calories: Secondary | ICD-10-CM | POA: Insufficient documentation

## 2020-03-28 LAB — INSULIN, RANDOM: INSULIN: 13.2 u[IU]/mL (ref 2.6–24.9)

## 2020-03-28 LAB — LIPID PANEL WITH LDL/HDL RATIO
Cholesterol, Total: 140 mg/dL (ref 100–199)
HDL: 61 mg/dL (ref >39)
LDL Chol Calc (NIH): 65 mg/dL (ref 0–99)
LDL/HDL Ratio: 1.1 ratio (ref 0.0–3.2)
Triglycerides: 69 mg/dL (ref 0–149)
VLDL Cholesterol Cal: 14 mg/dL (ref 5–40)

## 2020-03-28 LAB — HEMOGLOBIN A1C
Est. average glucose Bld gHb Est-mCnc: 111 mg/dL
Hgb A1c MFr Bld: 5.5 % (ref 4.8–5.6)

## 2020-03-28 LAB — VITAMIN D 25 HYDROXY (VIT D DEFICIENCY, FRACTURES): Vit D, 25-Hydroxy: 36 ng/mL (ref 30.0–100.0)

## 2020-04-07 ENCOUNTER — Ambulatory Visit: Payer: BC Managed Care – PPO | Admitting: Pharmacist

## 2020-04-07 ENCOUNTER — Other Ambulatory Visit: Payer: Self-pay

## 2020-04-07 ENCOUNTER — Encounter: Payer: Self-pay | Admitting: Pharmacist

## 2020-04-07 ENCOUNTER — Ambulatory Visit: Payer: BC Managed Care – PPO | Attending: Family Medicine | Admitting: Pharmacist

## 2020-04-07 VITALS — BP 112/78

## 2020-04-07 DIAGNOSIS — E6609 Other obesity due to excess calories: Secondary | ICD-10-CM

## 2020-04-07 DIAGNOSIS — Z23 Encounter for immunization: Secondary | ICD-10-CM

## 2020-04-07 NOTE — Progress Notes (Addendum)
   S:    Patient arrives in good spirits. Presents to the clinic for hypertension evaluation, counseling, and management.  Patient was referred and last seen by Dr. Jillyn Hidden on 03/10/20.   Patient is adherent with medications. She took her HCTZ this morning. She reports an improvement in swelling in her feet/ankles that she reported at her last visit.  Current BP Medications include:  HCTZ 25 mg daily  Antihypertensives tried in the past include: none  Dietary habits include: limits salt, no caffeine intake Exercise habits include: exercises daily Social history: never smoker Family history: HTN (mom and dad), DM & heart disease & kidney disease (mom)  ASCVD risk factors include: HTN   O:  Vitals:   04/07/20 1023  BP: 112/78    Home BP readings: does not check BP at home  Last 3 Office BP readings: BP Readings from Last 3 Encounters:  03/27/20 110/76  03/10/20 135/89  02/26/19 137/86    BMET    Component Value Date/Time   NA 139 03/10/2020 1124   K 4.3 03/10/2020 1124   CL 102 03/10/2020 1124   CO2 23 03/10/2020 1124   GLUCOSE 84 03/10/2020 1124   GLUCOSE 95 04/21/2018 2111   BUN 13 03/10/2020 1124   CREATININE 0.85 03/10/2020 1124   CALCIUM 9.6 03/10/2020 1124   GFRNONAA 90 03/10/2020 1124   GFRAA 104 03/10/2020 1124    Renal function: CrCl cannot be calculated (Patient's most recent lab result is older than the maximum 21 days allowed.).  Clinical ASCVD: No  The ASCVD Risk score Denman George DC Jr., et al., 2013) failed to calculate for the following reasons:   The 2013 ASCVD risk score is only valid for ages 50 to 39   A/P: Hypertension diagnosed 2019 currently controlled on current medications. BP Goal = < 130/80 mmHg. Medication adherence appropriate.  -Continue HCTZ 25 mg daily -Gave flu shot -Counseled on lifestyle modifications for blood pressure control including reduced dietary sodium, increased exercise, adequate sleep.  Results reviewed and written  information provided.   Total time in face-to-face counseling 15 minutes.   F/U Clinic Visit with PCP in January.  Arletha Pili, PharmD Candidate [employer], Class of 2022  [place] Shore Denhoff, PharmD, CPP Clinical Pharmacist [facility] [phone]

## 2020-04-10 ENCOUNTER — Encounter (INDEPENDENT_AMBULATORY_CARE_PROVIDER_SITE_OTHER): Payer: Self-pay | Admitting: Bariatrics

## 2020-04-10 ENCOUNTER — Other Ambulatory Visit: Payer: Self-pay

## 2020-04-10 ENCOUNTER — Ambulatory Visit (INDEPENDENT_AMBULATORY_CARE_PROVIDER_SITE_OTHER): Payer: BC Managed Care – PPO | Admitting: Bariatrics

## 2020-04-10 VITALS — BP 103/70 | HR 73 | Temp 98.1°F | Ht 69.0 in | Wt 237.0 lb

## 2020-04-10 DIAGNOSIS — E559 Vitamin D deficiency, unspecified: Secondary | ICD-10-CM

## 2020-04-10 DIAGNOSIS — E8881 Metabolic syndrome: Secondary | ICD-10-CM

## 2020-04-10 DIAGNOSIS — Z6835 Body mass index (BMI) 35.0-35.9, adult: Secondary | ICD-10-CM | POA: Diagnosis not present

## 2020-04-10 DIAGNOSIS — Z9189 Other specified personal risk factors, not elsewhere classified: Secondary | ICD-10-CM

## 2020-04-10 MED ORDER — VITAMIN D (ERGOCALCIFEROL) 1.25 MG (50000 UNIT) PO CAPS: 50000.0000 [IU] | ORAL_CAPSULE | ORAL | 0 refills | Status: DC

## 2020-04-11 ENCOUNTER — Encounter (INDEPENDENT_AMBULATORY_CARE_PROVIDER_SITE_OTHER): Payer: Self-pay | Admitting: Bariatrics

## 2020-04-11 NOTE — Progress Notes (Signed)
Chief Complaint:   OBESITY Brittany Elliott is here to discuss her progress with her obesity treatment plan along with follow-up of her obesity related diagnoses. Brittany Elliott is on the Category 3 Plan and states she is following her eating plan approximately 70% of the time. Brittany Elliott states she is doing cardio/weights 40 minutes 6 times per week.  Today's visit was #: 2 Starting weight: 245 lbs Starting date: 03/27/2020 Today's weight: 237 lbs Today's date: 04/10/2020 Total lbs lost to date: 8 Total lbs lost since last in-office visit: 8  Interim History: Brittany Elliott is down 8 lbs since her first visit. She struggled with the lunch and does not like frozen meals.  Subjective:   Insulin resistance. Brittany Elliott has a diagnosis of insulin resistance based on her elevated fasting insulin level >5. She continues to work on diet and exercise to decrease her risk of diabetes. No polyphagia.  Lab Results  Component Value Date   INSULIN 13.2 03/27/2020   Lab Results  Component Value Date   HGBA1C 5.5 03/27/2020   Vitamin D deficiency. Brittany Elliott is not on Vitamin D supplementation.    Ref. Range 03/27/2020 09:03  Vitamin D, 25-Hydroxy Latest Ref Range: 30.0 - 100.0 ng/mL 36.0   At risk for osteoporosis. Brittany Elliott is at higher risk of osteopenia and osteoporosis due to Vitamin D deficiency.   Assessment/Plan:   Insulin resistance. Brittany Elliott will continue to work on weight loss, exercise, increasing healthy fats and protein, and decreasing simple carbohydrates to help decrease the risk of diabetes. Brittany Elliott agreed to follow-up with [name] as directed to closely monitor her progress. Handout was provided on Insulin Resistance.  Vitamin D deficiency. Low Vitamin D level contributes to fatigue and are associated with obesity, breast, and colon cancer. She was given a prescription for Vitamin D @50 ,000 IU every week #4 with 0 refills and will follow-up for routine testing of Vitamin D, at least 2-3  times per year to avoid over-replacement.  At risk for osteoporosis. Brittany Elliott was given approximately 15 minutes of osteoporosis prevention counseling today. Brittany Elliott is at risk for osteopenia and osteoporosis due to her Vitamin D deficiency. She was encouraged to take her Vitamin D and follow her higher calcium diet and increase strengthening exercise to help strengthen her bones and decrease her risk of osteopenia and osteoporosis.  Repetitive spaced learning was employed today to elicit superior memory formation and behavioral change.  Class 2 severe obesity with serious comorbidity and body mass index (BMI) of 35.0 to 35.9 in adult, unspecified obesity type (HCC).  Brittany Elliott is currently in the action stage of change. As such, her goal is to continue with weight loss efforts. She has agreed to the Category 3 Plan and will journal 350-500 calories and 35+ grams of protein at lunch.  She will work on meal planning, mindful eating, weighing her food, and making smart fruit choices.  We reviewed with the patient labs from 03/27/2020 including lipids, Vitamin D, A1c, and insulin.   Exercise goals: All adults should avoid inactivity. Some physical activity is better than none, and adults who participate in any amount of physical activity gain some health benefits.  Behavioral modification strategies: increasing lean protein intake, decreasing simple carbohydrates, increasing vegetables, increasing water intake, decreasing eating out, no skipping meals, meal planning and cooking strategies, keeping healthy foods in the home and planning for success.  Brittany Elliott has agreed to follow-up with our clinic in 2 weeks. She was informed of the importance of frequent follow-up visits  to maximize her success with intensive lifestyle modifications for her multiple health conditions.   Objective:   Blood pressure 103/70, pulse 73, temperature 98.1 F (36.7 C), height 5\' 9"  (1.753 m), weight 237 lb (107.5 kg),  last menstrual period 03/13/2020, SpO2 100 %. Body mass index is 35 kg/m.  General: Cooperative, alert, well developed, in no acute distress. HEENT: Conjunctivae and lids unremarkable. Cardiovascular: Regular rhythm.  Lungs: Normal work of breathing. Neurologic: No focal deficits.   Lab Results  Component Value Date   CREATININE 0.85 03/10/2020   BUN 13 03/10/2020   NA 139 03/10/2020   K 4.3 03/10/2020   CL 102 03/10/2020   CO2 23 03/10/2020   No results found for: ALT, AST, GGT, ALKPHOS, BILITOT Lab Results  Component Value Date   HGBA1C 5.5 03/27/2020   Lab Results  Component Value Date   INSULIN 13.2 03/27/2020   Lab Results  Component Value Date   TSH 1.640 03/10/2020   Lab Results  Component Value Date   CHOL 140 03/27/2020   HDL 61 03/27/2020   LDLCALC 65 03/27/2020   TRIG 69 03/27/2020   Lab Results  Component Value Date   WBC 8.4 11/13/2015   HGB 12.0 11/13/2015   HCT 36.4 11/13/2015   MCV 88.3 11/13/2015   PLT 314 11/13/2015   No results found for: IRON, TIBC, FERRITIN  Attestation Statements:   Reviewed by clinician on day of visit: allergies, medications, problem list, medical history, surgical history, family history, social history, and previous encounter notes.  11/15/2015, am acting as Fernanda Drum for Energy manager, DO   I have reviewed the above documentation for accuracy and completeness, and I agree with the above. [employer], DO

## 2020-04-25 ENCOUNTER — Ambulatory Visit (INDEPENDENT_AMBULATORY_CARE_PROVIDER_SITE_OTHER): Payer: BC Managed Care – PPO | Admitting: Family Medicine

## 2020-04-25 ENCOUNTER — Encounter (INDEPENDENT_AMBULATORY_CARE_PROVIDER_SITE_OTHER): Payer: Self-pay | Admitting: Family Medicine

## 2020-04-25 ENCOUNTER — Other Ambulatory Visit: Payer: Self-pay

## 2020-04-25 VITALS — BP 108/72 | HR 84 | Temp 98.6°F | Ht 69.0 in | Wt 237.0 lb

## 2020-04-25 DIAGNOSIS — E8881 Metabolic syndrome: Secondary | ICD-10-CM | POA: Diagnosis not present

## 2020-04-25 DIAGNOSIS — E88819 Insulin resistance, unspecified: Secondary | ICD-10-CM

## 2020-04-25 DIAGNOSIS — I10 Essential (primary) hypertension: Secondary | ICD-10-CM

## 2020-04-25 DIAGNOSIS — E559 Vitamin D deficiency, unspecified: Secondary | ICD-10-CM | POA: Diagnosis not present

## 2020-04-25 DIAGNOSIS — Z6835 Body mass index (BMI) 35.0-35.9, adult: Secondary | ICD-10-CM

## 2020-04-25 DIAGNOSIS — Z9189 Other specified personal risk factors, not elsewhere classified: Secondary | ICD-10-CM | POA: Diagnosis not present

## 2020-04-25 MED ORDER — VITAMIN D (ERGOCALCIFEROL) 1.25 MG (50000 UNIT) PO CAPS: 50000.0000 [IU] | ORAL_CAPSULE | ORAL | 0 refills | Status: DC

## 2020-04-25 NOTE — Patient Instructions (Signed)
i

## 2020-04-25 NOTE — Progress Notes (Signed)
Chief Complaint:   OBESITY Brittany Elliott is here to discuss her progress with her obesity treatment plan along with follow-up of her obesity related diagnoses. Brittany Elliott is on the Category 3 Plan and keeping a food journal and adhering to recommended goals of 350-500 calories and 35 g protein at lunch and states she is following her eating plan approximately 50% of the time. Brittany Elliott states she is doing boot camp and walking 40 minutes 7 times per week.  Today's visit was #: 3 Starting weight: 245 lbs Starting date: 03/27/2020 Today's weight: 237 lbs Today's date: 05/06/2020 Total lbs lost to date: 8 lbs Total lbs lost since last in-office visit: 0 lbs Total weight loss percentage to date: -3.27%  Interim History: Brittany Elliott was on vacation at the beach the week of Thanksgiving. She notes that they went out to dinner a lot.She did not journal her lunches at all, when this was changed by Dr. Manson Passey at her last office visit. This is her first visit with me.  Plan: We discussed lunch options.  Assessment/Plan:   1. Insulin resistance Arli has a diagnosis of insulin resistance based on her elevated fasting insulin level >5. She continues to work on diet and exercise to decrease her risk of diabetes.  Lab Results  Component Value Date   INSULIN 13.2 03/27/2020   Lab Results  Component Value Date   HGBA1C 5.5 03/27/2020   Plan: [name] will continue to work on weight loss, exercise, and decreasing simple carbohydrates to help decrease the risk of diabetes. [name] agreed to follow-up with [name] as directed to closely monitor her progress.  2. Vitamin D deficiency Brittany Elliott is taking prescription Vit D once a week and is tolerating it well with no side effects.  Plan: Refill Vit D for 1 month   3. Essential hypertension Brittany Elliott is taking hydrochlorothiazide and decline symptoms or concerns. She doesn't check her blood pressure at home.  Plan: Check blood pressure every couple of  days. Continue low salt/prudent nutritional plan and weight loss.   4. At risk for impaired metabolic function Brittany Elliott was given approximately 10 minutes of impaired  metabolic function prevention counseling today. We discussed intensive lifestyle modifications today with an emphasis on specific nutrition and exercise instructions and strategies.   5. Class 2 severe obesity with serious comorbidity and body mass index (BMI) of 35.0 to 35.9 in adult, unspecified obesity type (HCC) Brittany Elliott is currently in the action stage of change. As such, her goal is to continue with weight loss efforts. She has agreed to the Category 3 Plan and keeping a food journal and adhering to recommended goals of 350-500 calories and 35g protein at lunch.   Exercise goals: As is  Behavioral modification strategies: meal planning and cooking strategies and planning for success.  Brittany Elliott has agreed to follow-up with our clinic in 2 weeks. She was informed of the importance of frequent follow-up visits to maximize her success with intensive lifestyle modifications for her multiple health conditions.   Objective:   Blood pressure 108/72, pulse 84, temperature 98.6 F (37 C), height 5\' 9"  (1.753 m), weight 237 lb (107.5 kg), SpO2 100 %. Body mass index is 35 kg/m.  General: Cooperative, alert, well developed, in no acute distress. HEENT: Conjunctivae and lids unremarkable. Cardiovascular: Regular rhythm.  Lungs: Normal work of breathing. Neurologic: No focal deficits.   Lab Results  Component Value Date   CREATININE 0.85 03/10/2020   BUN 13 03/10/2020   NA 139 03/10/2020  K 4.3 03/10/2020   CL 102 03/10/2020   CO2 23 03/10/2020   Lab Results  Component Value Date   HGBA1C 5.5 03/27/2020   Lab Results  Component Value Date   INSULIN 13.2 03/27/2020   Lab Results  Component Value Date   TSH 1.640 03/10/2020   Lab Results  Component Value Date   CHOL 140 03/27/2020   HDL 61 03/27/2020    LDLCALC 65 03/27/2020   TRIG 69 03/27/2020   Lab Results  Component Value Date   WBC 8.4 11/13/2015   HGB 12.0 11/13/2015   HCT 36.4 11/13/2015   MCV 88.3 11/13/2015   PLT 314 11/13/2015    Attestation Statements:   Reviewed by clinician on day of visit: allergies, medications, problem list, medical history, surgical history, family history, social history, and previous encounter notes.  Edmund Hilda, am acting as Energy manager for [employer], DO.  I have reviewed the above documentation for accuracy and completeness, and I agree with the above. Carlye Grippe, D.O.  The 21st Century Cures Act was signed into law in 2016 which includes the topic of electronic health records.  This provides immediate access to information in MyChart.  This includes consultation notes, operative notes, office notes, lab results and pathology reports.  If you have any questions about what you read please let us know at your next visit so we can discuss your concerns and take corrective action if need be.  We are right here with you.

## 2020-05-10 ENCOUNTER — Other Ambulatory Visit: Payer: Self-pay

## 2020-05-10 ENCOUNTER — Encounter (INDEPENDENT_AMBULATORY_CARE_PROVIDER_SITE_OTHER): Payer: Self-pay | Admitting: Family Medicine

## 2020-05-10 ENCOUNTER — Ambulatory Visit (INDEPENDENT_AMBULATORY_CARE_PROVIDER_SITE_OTHER): Payer: BC Managed Care – PPO | Admitting: Family Medicine

## 2020-05-10 VITALS — BP 108/68 | HR 82 | Temp 98.1°F | Ht 69.0 in | Wt 238.0 lb

## 2020-05-10 DIAGNOSIS — E559 Vitamin D deficiency, unspecified: Secondary | ICD-10-CM

## 2020-05-10 DIAGNOSIS — Z6835 Body mass index (BMI) 35.0-35.9, adult: Secondary | ICD-10-CM | POA: Diagnosis not present

## 2020-05-10 DIAGNOSIS — Z9189 Other specified personal risk factors, not elsewhere classified: Secondary | ICD-10-CM

## 2020-05-10 MED ORDER — VITAMIN D (ERGOCALCIFEROL) 1.25 MG (50000 UNIT) PO CAPS: 50000.0000 [IU] | ORAL_CAPSULE | ORAL | 0 refills | Status: DC

## 2020-05-15 NOTE — Progress Notes (Signed)
Chief Complaint:   OBESITY Brittany Elliott is here to discuss her progress with her obesity treatment plan along with follow-up of her obesity related diagnoses. Brittany Elliott is on the Category 3 Plan + 350-500 calories and 35 grams protein at lunch and states she is following her eating plan approximately 40% of the time. Brittany Elliott states she is doing boot camp 40 minutes 5 times per week.  Today's visit was #: 4 Starting weight: 245 lbs Starting date: 03/27/2020 Today's weight: 238 lbs Today's date: 05/10/2020 Total lbs lost to date: 7 lbs Total lbs lost since last in-office visit: +1 lb Total weight loss percentage to date: -2.86%  Interim History: Brittany Elliott's last office visit was with me but previously with Dr. Manson Passey. She reports that she eats breakfast and lunch on plan but dinner is difficult. 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   Assessment/Plan:   1. Vitamin D deficiency Brittany Elliott's Vitamin D level was 36.0 on 03/27/2020. She is currently taking prescription vitamin D 50,000 IU each week. She denies nausea, vomiting or muscle weakness.   Ref. Range 03/27/2020 09:03  Vitamin D, 25-Hydroxy Latest Ref Range: 30.0 - 100.0 ng/mL 36.0   Plan: Refill Vit D for 1 month, as per below. Low Vitamin D level contributes to fatigue and are associated with obesity, breast, and colon cancer. She agrees to continue to take prescription Vitamin D @50 ,000 IU every week and will follow-up for routine testing of Vitamin D, at least 2-3 times per year to avoid over-replacement.  Refill- Vitamin D, Ergocalciferol, (DRISDOL) 1.25 MG (50000 UNIT) CAPS capsule; Take 1 capsule (50,000 Units total) by mouth every 7 (seven) days.  Dispense: 4 capsule; Refill: 0  2. At risk for impaired metabolic function Brittany Elliott was given approximately 15 minutes of impaired  metabolic function prevention  counseling today. We discussed intensive lifestyle modifications today with an emphasis on specific nutrition and exercise instructions and strategies.   3. Class 2 severe obesity with serious comorbidity and body mass index (BMI) of 35.0 to 35.9 in adult, unspecified obesity type (HCC) Brittany Elliott is currently in the action stage of change. As such, her goal is to continue with weight loss efforts. She has agreed to the Category 3 Plan + 350-500 calories + 35 grams of protein at lunch.   Exercise goals: As is  Behavioral modification strategies: increasing lean protein intake, meal planning and cooking strategies and planning for success.  Brittany Elliott has agreed to follow-up with our clinic in 3 weeks. She was informed of the importance of frequent follow-up visits to maximize her success with intensive lifestyle modifications for her multiple health conditions.   Objective:   Blood pressure 108/68, pulse 82, temperature 98.1 F (36.7 C), height 5\' 9"  (1.753 m), weight 238 lb (108 kg), SpO2 99 %. Body mass index is 35.15 kg/m.  General: Cooperative, alert, well developed, in no acute distress. HEENT: Conjunctivae and lids unremarkable. Cardiovascular: Regular rhythm.  Lungs: Normal work of breathing. Neurologic: No focal deficits.   Lab Results  Component Value Date   CREATININE 0.85 03/10/2020   BUN 13 03/10/2020   NA 139 03/10/2020   K 4.3 03/10/2020   CL 102 03/10/2020   CO2 23 03/10/2020   No results found for: ALT, AST, GGT, ALKPHOS, BILITOT Lab Results  Component Value Date   HGBA1C 5.5 03/27/2020   Lab  Results  Component Value Date   INSULIN 13.2 03/27/2020   Lab Results  Component Value Date   TSH 1.640 03/10/2020   Lab Results  Component Value Date   CHOL 140 03/27/2020   HDL 61 03/27/2020   LDLCALC 65 03/27/2020   TRIG 69 03/27/2020   Lab Results  Component Value Date   WBC 8.4 11/13/2015   HGB 12.0 11/13/2015   HCT 36.4 11/13/2015   MCV 88.3 11/13/2015    PLT 314 11/13/2015    Attestation Statements:   Reviewed by clinician on day of visit: allergies, medications, problem list, medical history, surgical history, family history, social history, and previous encounter notes.  Edmund Hilda, am acting as Energy manager for [employer], DO.  I have reviewed the above documentation for accuracy and completeness, and I agree with the above. Carlye Grippe, D.O.  The 21st Century Cures Act was signed into law in 2016 which includes the topic of electronic health records.  This provides immediate access to information in MyChart.  This includes consultation notes, operative notes, office notes, lab results and pathology reports.  If you have any questions about what you read please let us know at your next visit so we can discuss your concerns and take corrective action if need be.  We are right here with you.

## 2020-05-29 ENCOUNTER — Other Ambulatory Visit: Payer: Self-pay

## 2020-05-29 ENCOUNTER — Telehealth (INDEPENDENT_AMBULATORY_CARE_PROVIDER_SITE_OTHER): Payer: Self-pay

## 2020-05-29 ENCOUNTER — Telehealth (INDEPENDENT_AMBULATORY_CARE_PROVIDER_SITE_OTHER): Payer: BC Managed Care – PPO | Admitting: Family Medicine

## 2020-05-29 ENCOUNTER — Encounter (INDEPENDENT_AMBULATORY_CARE_PROVIDER_SITE_OTHER): Payer: Self-pay | Admitting: Family Medicine

## 2020-05-29 VITALS — BP 132/56 | Ht 69.0 in

## 2020-05-29 DIAGNOSIS — Z724 Inappropriate diet and eating habits: Secondary | ICD-10-CM

## 2020-05-29 DIAGNOSIS — E8881 Metabolic syndrome: Secondary | ICD-10-CM

## 2020-05-29 DIAGNOSIS — Z6835 Body mass index (BMI) 35.0-35.9, adult: Secondary | ICD-10-CM

## 2020-05-29 DIAGNOSIS — E559 Vitamin D deficiency, unspecified: Secondary | ICD-10-CM | POA: Diagnosis not present

## 2020-05-29 DIAGNOSIS — I10 Essential (primary) hypertension: Secondary | ICD-10-CM | POA: Diagnosis not present

## 2020-05-29 DIAGNOSIS — E88819 Insulin resistance, unspecified: Secondary | ICD-10-CM

## 2020-05-29 DIAGNOSIS — Z9189 Other specified personal risk factors, not elsewhere classified: Secondary | ICD-10-CM

## 2020-05-29 MED ORDER — VITAMIN D (ERGOCALCIFEROL) 1.25 MG (50000 UNIT) PO CAPS: 50000.0000 [IU] | ORAL_CAPSULE | ORAL | 0 refills | Status: DC

## 2020-05-29 NOTE — Telephone Encounter (Signed)
Verbal consent obtained to conduct video visit, via telehealth 

## 2020-05-30 NOTE — Progress Notes (Signed)
TeleHealth Visit:  Due to the COVID-19 pandemic, this visit was completed with telemedicine (audio/video) technology to reduce patient and provider exposure as well as to preserve personal protective equipment.   Dorna has verbally consented to this TeleHealth visit. The patient is located at home, the provider is located at the [facility] office. The participants in this visit include the listed provider and patient. The visit was conducted today via video.   Chief Complaint: OBESITY Brittany Elliott is here to discuss her progress with her obesity treatment plan along with follow-up of her obesity related diagnoses. Brittany Elliott is on the Category 3 Plan + 350-500 calories and 35 grams of protein at lunch and states she is following her eating plan approximately 60% of the time. Brittany Elliott states she is doing [employer] fitness class amnd walking 40 minutes 5 times per week.  Today's visit was #: 5 Starting weight: 245 lbs Starting date: 03/27/2020  Interim History: Brittany Elliott was last seen 05/10/2020. She doesn't think she lost or gained over the past several weeks. She was not really following the plan and finds it more challenging to do so when she is not in a routine of going to work every day. Two of her coworkers will be doing the plan as well in the upcoming weeks. She thinks this will help her very much to stay on the plan.  Assessment/Plan:   1. Essential hypertension Atalia's systolic blood pressure at home is in the 130's but she checks it infrequently. She is prescribed HCTZ only and her BP has been at goal during most of her office visits. Kitzia denies concerns with meds or new onset of symptoms.  Plan: I recommend Brittany Elliott continue HCTZ, practice home blood pressure monitoring daily and bring log to next OV. Also decrease salt intake, increase exercise and weight loss.   2. Insulin resistance Brittany Elliott's sister is diabetic and she is concerned that she will get it if she  doesn't get healthy.  Brittany Elliott has a diagnosis of insulin resistance based on her elevated fasting insulin level >5. She continues to work on diet and exercise to decrease her risk of diabetes.  Lab Results  Component Value Date   INSULIN 13.2 03/27/2020   Lab Results  Component Value Date   HGBA1C 5.5 03/27/2020   Plan: Counseling done regarding disease process and importance of prudent nutritional plan and weight loss. I recommend increasing exercise and rechecking labs in 3 months. Brittany Elliott will continue to work on weight loss, exercise, and decreasing simple carbohydrates to help decrease the risk of diabetes. Brittany Elliott agreed to follow-up with [name] as directed to closely monitor her progress.  3. Vitamin D deficiency Brittany Elliott's Vitamin D level was 36 on 03/27/2020. She is currently taking prescription vitamin D 50,000 IU each week. She denies nausea, vomiting or muscle weakness  Ref. Range 03/27/2020 09:03  Vitamin D, 25-Hydroxy Latest Ref Range: 30.0 - 100.0 ng/mL 36.0   Plan: Refill Vit D for 1 month, as per below. Low Vitamin D level contributes to fatigue and are associated with obesity, breast, and colon cancer. She agrees to continue to take prescription Vitamin D @50 ,000 IU every week and will follow-up for routine testing of Vitamin D, at least 2-3 times per year to avoid over-replacement.  Refill- Vitamin D, Ergocalciferol, (DRISDOL) 1.25 MG (50000 UNIT) CAPS capsule; Take 1 capsule (50,000 Units total) by mouth every 7 (seven) days.  Dispense: 4 capsule; Refill: 0  4. At risk for heart disease Due to  Brittany Elliott's current state of health and medical condition(s), she is at a higher risk for heart disease. This puts the patient at much greater risk to subsequently develop cardiopulmonary conditions that can significantly affect patient's quality of life in a negative manner as well. Her mother and sister have hypertension and CKD due to poorly controlled blood pressure and diagnosis of  CV, as well.   At least 15 minutes was spent on counseling Brittany Elliott about these concerns today and I stressed the importance of reversing risks factors of obesity, esp truncal and visceral fat, hypertension, hyperlipidemia, pre-diabetes. Initial goal is to lose at least 5-10% of starting weight to help reduce these risk factors. Counseling: Intensive lifestyle modifications discussed with Brittany Elliott as most appropriate first line treatment. She will continue to work on diet, exercise and weight loss efforts.  We will continue to reassess these conditions on a fairly regular basis in an attempt to decrease patient's overall morbidity and mortality.  Evidence-based interventions for health behavior change were utilized today including the discussion of self monitoring techniques, problem-solving barriers and SMART goal setting techniques. Specifically regarding patient's less desirable eating habits and patterns, we employed the technique of small changes when Brittany Elliott has not been able to fully commit to her prudent nutritional plan.   5. Class 2 severe obesity with serious comorbidity and body mass index (BMI) of 35.0 to 35.9 in adult, unspecified obesity type (HCC) Brittany Elliott is currently in the action stage of change. As such, her goal is to continue with weight loss efforts. She has agreed to the Category 3 Plan + 350-500 calories, + 35 grams of protein at lunch.   Exercise goals: As is  Behavioral modification strategies: meal planning and cooking strategies, keeping healthy foods in the home and planning for success.  Brittany Elliott has agreed to follow-up with our clinic in 2 weeks. She was informed of the importance of frequent follow-up visits to maximize her success with intensive lifestyle modifications for her multiple health conditions.  Objective:   VITALS: Per patient if applicable, see vitals. GENERAL: Alert and in no acute distress. CARDIOPULMONARY: No increased WOB. Speaking in clear  sentences.  PSYCH: Pleasant and cooperative. Speech normal rate and rhythm. Affect is appropriate. Insight and judgement are appropriate. Attention is focused, linear, and appropriate.  NEURO: Oriented as arrived to appointment on time with no prompting.   Lab Results  Component Value Date   CREATININE 0.85 03/10/2020   BUN 13 03/10/2020   NA 139 03/10/2020   K 4.3 03/10/2020   CL 102 03/10/2020   CO2 23 03/10/2020   No results found for: ALT, AST, GGT, ALKPHOS, BILITOT Lab Results  Component Value Date   HGBA1C 5.5 03/27/2020   Lab Results  Component Value Date   INSULIN 13.2 03/27/2020   Lab Results  Component Value Date   TSH 1.640 03/10/2020   Lab Results  Component Value Date   CHOL 140 03/27/2020   HDL 61 03/27/2020   LDLCALC 65 03/27/2020   TRIG 69 03/27/2020   Lab Results  Component Value Date   WBC 8.4 11/13/2015   HGB 12.0 11/13/2015   HCT 36.4 11/13/2015   MCV 88.3 11/13/2015   PLT 314 11/13/2015    Attestation Statements:   Reviewed by clinician on day of visit: allergies, medications, problem list, medical history, surgical history, family history, social history, and previous encounter notes.  Edmund Hilda, am acting as Energy manager for [employer], DO.  I have reviewed the above documentation  for accuracy and completeness, and I agree with the above. Marjory Sneddon, D.O.  The [place] was signed into law in 2016 which includes the topic of electronic health records.  This provides immediate access to information in MyChart.  This includes consultation notes, operative notes, office notes, lab results and pathology reports.  If you have any questions about what you read please let us know at your next visit so we can discuss your concerns and take corrective action if need be.  We are right here with you.

## 2020-06-09 ENCOUNTER — Ambulatory Visit: Payer: Self-pay | Attending: Internal Medicine | Admitting: Internal Medicine

## 2020-06-09 ENCOUNTER — Encounter: Payer: Self-pay | Admitting: Internal Medicine

## 2020-06-09 ENCOUNTER — Other Ambulatory Visit: Payer: Self-pay

## 2020-06-09 ENCOUNTER — Ambulatory Visit: Payer: BC Managed Care – PPO | Admitting: Family Medicine

## 2020-06-09 VITALS — Wt 233.0 lb

## 2020-06-09 DIAGNOSIS — I10 Essential (primary) hypertension: Secondary | ICD-10-CM

## 2020-06-09 DIAGNOSIS — E669 Obesity, unspecified: Secondary | ICD-10-CM

## 2020-06-09 MED ORDER — HYDROCHLOROTHIAZIDE 25 MG PO TABS: 25.0000 mg | ORAL_TABLET | Freq: Every day | ORAL | 1 refills | Status: DC

## 2020-06-09 NOTE — Progress Notes (Signed)
Virtual Visit via Telephone Note  I connected with Brittany Elliott on 06/09/20 at  9:30 AM EST by telephone and verified that I am speaking with the correct person using two identifiers.  Location: Patient: home Provider: office  I discussed the limitations, risks, security and privacy concerns of performing an evaluation and management service by telephone and the availability of in person appointments. I also discussed with the patient that there may be a patient responsible charge related to this service. The patient expressed understanding and agreed to proceed.   History of Present Illness: Patient with history of HTN, obesity.  Last PCP was Dr. Jillyn Hidden who is no longer with the practice.  HYPERTENSION Currently taking: see medication list.  On last visit with Dr. Follow-up in October, hydrochlorothiazide was increased to 25 mg daily. Med Adherence: [x]  Yes    []  No Medication side effects: []  Yes    [x]  No Adherence with salt restriction: [x]  Yes    []  No Home Monitoring?: [x]  Yes but not often.  Blood pressure today before taking her medication was 167/89.  She has a wrist device at home and wonders whether it is accurate.  Review of blood pressure readings from her visit with healthy weight and management program reveals good readings.  Some of her most recent ones were 132/56, 108/68, 108/72. Monitoring Frequency: []  Yes    []  No Home BP results range: []  Yes    []  No Leg swelling?:  Swelling in the leg is less with use of hydrochlorothiazide.  Obesity: She is in the healthy weight management program.  Reports that when she started the program her weight was 245 pounds.  She is now at 233 pounds.  She is going to the gym 4 times a week and working out for 40 minutes.  She has made significant changes in her eating habits.  HM: Due for Pap smear.  States that it is already scheduled with [facility] for next month.   Observations/Objective: No direct observation done  as this was a telephone encounter.  Assessment and Plan: 1. Essential hypertension Reported home blood pressure today is elevated but she had not taken her medicine as yet.  Review of blood pressure readings from recent visits with healthy weight management program have been good.  I recommend continuing the hydrochlorothiazide.  I told her that if she wants to check the accuracy of her blood pressure device, she can take it with her on her next visit to the weight management visit and check the blood pressure with it at the visit after it is checked in the office to see if the 2 numbers are similar. - hydrochlorothiazide (HYDRODIURIL) 25 MG tablet; Take 1 tablet (25 mg total) by mouth daily.  Dispense: 90 tablet; Refill: 1  2. Obesity (BMI 30.0-34.9) Commended her on weight loss so far.  Encouraged her to keep up the good work with healthy eating habits and regular exercise.  Follow Up Instructions: 4 months   I discussed the assessment and treatment plan with the patient. The patient was provided an opportunity to ask questions and all were answered. The patient agreed with the plan and demonstrated an understanding of the instructions.   The patient was advised to call back or seek an in-person evaluation if the symptoms worsen or if the condition fails to improve as anticipated.  I provided 10 minutes of non-face-to-face time during this encounter.   , MD

## 2020-06-12 ENCOUNTER — Telehealth (INDEPENDENT_AMBULATORY_CARE_PROVIDER_SITE_OTHER): Payer: Self-pay

## 2020-06-12 ENCOUNTER — Telehealth (INDEPENDENT_AMBULATORY_CARE_PROVIDER_SITE_OTHER): Payer: BC Managed Care – PPO | Admitting: Family Medicine

## 2020-06-12 ENCOUNTER — Encounter (INDEPENDENT_AMBULATORY_CARE_PROVIDER_SITE_OTHER): Payer: Self-pay | Admitting: Family Medicine

## 2020-06-12 ENCOUNTER — Other Ambulatory Visit: Payer: Self-pay

## 2020-06-12 VITALS — BP 167/89

## 2020-06-12 DIAGNOSIS — Z6835 Body mass index (BMI) 35.0-35.9, adult: Secondary | ICD-10-CM | POA: Diagnosis not present

## 2020-06-12 DIAGNOSIS — Z9189 Other specified personal risk factors, not elsewhere classified: Secondary | ICD-10-CM

## 2020-06-12 DIAGNOSIS — E559 Vitamin D deficiency, unspecified: Secondary | ICD-10-CM

## 2020-06-12 MED ORDER — VITAMIN D (ERGOCALCIFEROL) 1.25 MG (50000 UNIT) PO CAPS: 50000.0000 [IU] | ORAL_CAPSULE | ORAL | 0 refills | Status: DC

## 2020-06-12 NOTE — Telephone Encounter (Signed)
Verbal consent obtained to conduct video visit, via telehealth 

## 2020-06-13 NOTE — Progress Notes (Signed)
TeleHealth Visit:  Due to the COVID-19 pandemic, this visit was completed with telemedicine (audio/video) technology to reduce patient and provider exposure as well as to preserve personal protective equipment.   Brittany Elliott has verbally consented to this TeleHealth visit. The patient is located at home, the provider is located at the [facility] office. The participants in this visit include the listed provider and patient. The visit was conducted today via video.   Chief Complaint: OBESITY Brittany Elliott is here to discuss her progress with her obesity treatment plan along with follow-up of her obesity related diagnoses. Brittany Elliott is on the Category 3 Plan + 350-500 calories and 35 g protein at lunch and states she is following her eating plan approximately 60% of the time. Brittany Elliott states she is doing circuit training 40 minutes 5 times per week.  Today's visit was #: 6 Starting weight: 245 lbs Starting date: 03/27/2020  Interim History: Brittany Elliott states she thinks she did better when she followed the straight Category 3 plan without lunch options. She thinks she lost more weight and ate much better. Patient denies issues or concerns today. She has no current BP's the past couple of days but overall thinks it is well controlled. Patient denies symptoms or concerns.  Plan: Email patient soup and other recipes,if possible (+/- 1 protein shake instead of 4-6 oz of protein with 160-150 calories and 30 grams of protein)  Assessment/Plan:   1. Vitamin D deficiency Brittany Elliott's Vitamin D level was 36.0 on 03/27/2020. She is currently taking prescription vitamin D 50,000 IU each week. She denies nausea, vomiting or muscle weakness.  Ref. Range 03/27/2020 09:03  Vitamin D, 25-Hydroxy Latest Ref Range: 30.0 - 100.0 ng/mL 36.0   Plan: Low Vitamin D level contributes to fatigue and are associated with obesity, breast, and colon cancer. She agrees to continue to take prescription Vitamin D  @50 ,000 IU every week and will follow-up for routine testing of Vitamin D, at least 2-3 times per year to avoid over-replacement.  Refill- Vitamin D, Ergocalciferol, (DRISDOL) 1.25 MG (50000 UNIT) CAPS capsule; Take 1 capsule (50,000 Units total) by mouth every 7 (seven) days.  Dispense: 4 capsule; Refill: 0  2. At risk for deficient intake of food Brittany Elliott was given approximately 15 minutes of deficit intake of food prevention counseling today. Brittany Elliott is at risk for eating too few calories based on current food recall. She was encouraged to focus on meeting caloric and protein goals according to her recommended meal plan.   3. Class 2 severe obesity with serious comorbidity and body mass index (BMI) of 35.0 to 35.9 in adult, unspecified obesity type (HCC) Brittany Elliott is currently in the action stage of change. As such, her goal is to continue with weight loss efforts. She has agreed to the Category 3 Plan.   Exercise goals: As is  Behavioral modification strategies: increasing lean protein intake, meal planning and cooking strategies, keeping healthy foods in the home and planning for success.  Brittany Elliott has agreed to follow-up with our clinic in 2 weeks on 06/27/2020 at 0840. She was informed of the importance of frequent follow-up visits to maximize her success with intensive lifestyle modifications for her multiple health conditions.  Objective:   VITALS: Per patient if applicable, see vitals. GENERAL: Alert and in no acute distress. CARDIOPULMONARY: No increased WOB. Speaking in clear sentences.  PSYCH: Pleasant and cooperative. Speech normal rate and rhythm. Affect is appropriate. Insight and judgement are appropriate. Attention is focused, linear, and appropriate.  NEURO: Oriented as arrived to appointment on time with no prompting.   Lab Results  Component Value Date   CREATININE 0.85 03/10/2020   BUN 13 03/10/2020   NA 139 03/10/2020   K 4.3 03/10/2020   CL 102 03/10/2020   CO2 23  03/10/2020   No results found for: ALT, AST, GGT, ALKPHOS, BILITOT Lab Results  Component Value Date   HGBA1C 5.5 03/27/2020   Lab Results  Component Value Date   INSULIN 13.2 03/27/2020   Lab Results  Component Value Date   TSH 1.640 03/10/2020   Lab Results  Component Value Date   CHOL 140 03/27/2020   HDL 61 03/27/2020   LDLCALC 65 03/27/2020   TRIG 69 03/27/2020   Lab Results  Component Value Date   WBC 8.4 11/13/2015   HGB 12.0 11/13/2015   HCT 36.4 11/13/2015   MCV 88.3 11/13/2015   PLT 314 11/13/2015    Attestation Statements:   Reviewed by clinician on day of visit: allergies, medications, problem list, medical history, surgical history, family history, social history, and previous encounter notes.  Edmund Hilda, am acting as Energy manager for [employer], DO.  I have reviewed the above documentation for accuracy and completeness, and I agree with the above. -  *Carlye Grippe, D.O.  The 21st Century Cures Act was signed into law in 2016 which includes the topic of electronic health records.  This provides immediate access to information in MyChart.  This includes consultation notes, operative notes, office notes, lab results and pathology reports.  If you have any questions about what you read please let us know at your next visit so we can discuss your concerns and take corrective action if need be.  We are right here with you.

## 2020-06-27 ENCOUNTER — Other Ambulatory Visit: Payer: Self-pay

## 2020-06-27 ENCOUNTER — Ambulatory Visit (INDEPENDENT_AMBULATORY_CARE_PROVIDER_SITE_OTHER): Payer: BC Managed Care – PPO | Admitting: Family Medicine

## 2020-06-27 VITALS — BP 110/70 | HR 77 | Temp 98.6°F | Ht 69.0 in | Wt 234.0 lb

## 2020-06-27 DIAGNOSIS — Z9189 Other specified personal risk factors, not elsewhere classified: Secondary | ICD-10-CM | POA: Diagnosis not present

## 2020-06-27 DIAGNOSIS — E669 Obesity, unspecified: Secondary | ICD-10-CM

## 2020-06-27 DIAGNOSIS — Z6834 Body mass index (BMI) 34.0-34.9, adult: Secondary | ICD-10-CM

## 2020-06-27 DIAGNOSIS — E559 Vitamin D deficiency, unspecified: Secondary | ICD-10-CM

## 2020-06-27 DIAGNOSIS — I10 Essential (primary) hypertension: Secondary | ICD-10-CM

## 2020-06-27 MED ORDER — VITAMIN D (ERGOCALCIFEROL) 1.25 MG (50000 UNIT) PO CAPS: 50000.0000 [IU] | ORAL_CAPSULE | ORAL | 0 refills | Status: DC

## 2020-06-28 NOTE — Progress Notes (Signed)
Chief Complaint:   OBESITY Brittany Elliott is here to discuss her progress with her obesity treatment plan along with follow-up of her obesity related diagnoses.   Today's visit was #: 7 Starting weight: 245 lbs Starting date: 03/27/2020 Today's weight: 234 lbs Today's date: 06/27/2020 Total lbs lost to date: 11 lbs Body mass index is 34.56 kg/m.  Total weight loss percentage to date: -4.49%  Interim History: Brittany Elliott's last "in office" visit was on 05/10/2020.  She says she has less motivation lately to stay on plan and has been struggling a bit due to the weather, etc.  Brittany Elliott is here for a follow up office visit.  she is following the meal plan without concern or issues.  Patient's meal and food recall appears to be accurate and consistent with what is on the plan.   When on plan, her hunger and cravings are well controlled.    Plan:  Motivational strategies to help stay on plan were discussed with her today.  Nutrition Plan: the Category 3 Plan for 40% of the time.  Activity: Circuit training for 40 minutes 4 times per week.  Assessment/Plan:   1. Vitamin D deficiency Relda's Vitamin D level was 36.0 on 03/27/2020. She is currently taking prescription vitamin D 50,000 IU each week. She denies nausea, vomiting or muscle weakness.  - Refill Vitamin D, Ergocalciferol, (DRISDOL) 1.25 MG (50000 UNIT) CAPS capsule; Take 1 capsule (50,000 Units total) by mouth every 7 (seven) days.  Dispense: 4 capsule; Refill: 0  2. Essential hypertension Stable, at goal.  Asx.  No concerns.  Medications: HCTZ 25 mg daily.    Plan: - BP is at goal today.  she will continue current treatment regimen as a medication change is not required today  - Counseled Brittany Elliott on pathophysiology of disease and discussed treatment plan, which always includes dietary and lifestyle modification as first line.   - Lifestyle changes such as following our low salt, heart healthy meal plan and  engaging in a regular exercise program discussed extensively with patient.   - Avoid buying foods that are: processed, frozen, or prepackaged to avoid excess salt.  - Ambulatory blood pressure monitoring encouraged at least 2-3 times weekly.  Keep log and bring to office visit.  Reminded patient that if they ever feel poorly in any way, to check their blood pressure and pulse as well.  - We will continue to monitor closely alongside PCP/ specialists.  Pt reminded to also f/up with those individuals as instructed by them.   - We will continue to monitor symptoms as they relate to the her weight loss journey.  BP Readings from Last 3 Encounters:  06/27/20 110/70  06/12/20 (!) 167/89  05/29/20 (!) 132/56   Lab Results  Component Value Date   CREATININE 0.85 03/10/2020   3. At risk for malnutrition Brittany Elliott was given extensive malnutrition prevention education and counseling today of more than 9 minutes.  Counseled her that malnutrition refers to inappropriate nutrients or not the right balance of nutrients for optimal health.  Discussed with Brittany Elliott that it is absolutely possible to be malnourished but yet obese.  Risk factors, including but not limited to, inappropriate dietary choices, difficulty with obtaining food due to physical or financial limitations, and various physical and mental health conditions were reviewed with Brittany Elliott.   4. Class 1 obesity with serious comorbidity and body mass index (BMI) of 34.0 to 34.9 in adult, unspecified obesity  type  Course: Brittany Elliott is currently in the action stage of change. As such, her goal is to continue with weight loss efforts.   Nutrition goals: She has agreed to the Category 3 Plan.   Exercise goals: As is.  Behavioral modification strategies: increasing lean protein intake, meal planning and cooking strategies, keeping healthy foods in the home, ways to avoid boredom eating and avoiding temptations.  No orders of  the defined types were placed in this encounter.   Medications Discontinued During This Encounter  Medication Reason  . Vitamin D, Ergocalciferol, (DRISDOL) 1.25 MG (50000 UNIT) CAPS capsule Reorder     Meds ordered this encounter  Medications  . Vitamin D, Ergocalciferol, (DRISDOL) 1.25 MG (50000 UNIT) CAPS capsule    Sig: Take 1 capsule (50,000 Units total) by mouth every 7 (seven) days.    Dispense:  4 capsule    Refill:  0    Brittany Elliott has agreed to follow-up with our clinic in 2 weeks. She was informed of the importance of frequent follow-up visits to maximize her success with intensive lifestyle modifications for her multiple health conditions.   Objective:   Blood pressure 110/70, pulse 77, temperature 98.6 F (37 C), height 5\' 9"  (1.753 m), weight 234 lb (106.1 kg), SpO2 100 %. Body mass index is 34.56 kg/m.  General: Cooperative, alert, well developed, in no acute distress. HEENT: Conjunctivae and lids unremarkable. Cardiovascular: Regular rhythm.  Lungs: Normal work of breathing. Neurologic: No focal deficits.   Lab Results  Component Value Date   CREATININE 0.85 03/10/2020   BUN 13 03/10/2020   NA 139 03/10/2020   K 4.3 03/10/2020   CL 102 03/10/2020   CO2 23 03/10/2020   Lab Results  Component Value Date   HGBA1C 5.5 03/27/2020   Lab Results  Component Value Date   INSULIN 13.2 03/27/2020   Lab Results  Component Value Date   TSH 1.640 03/10/2020   Lab Results  Component Value Date   CHOL 140 03/27/2020   HDL 61 03/27/2020   LDLCALC 65 03/27/2020   TRIG 69 03/27/2020   Lab Results  Component Value Date   WBC 8.4 11/13/2015   HGB 12.0 11/13/2015   HCT 36.4 11/13/2015   MCV 88.3 11/13/2015   PLT 314 11/13/2015   Attestation Statements:   Reviewed by clinician on day of visit: allergies, medications, problem list, medical history, surgical history, family history, social history, and previous encounter notes.  I, 11/15/2015, CMA, am  acting as Insurance claims handler for Energy manager, DO.  I have reviewed the above documentation for accuracy and completeness, and I agree with the above. [employer], D.O.  The 21st Century Cures Act was signed into law in 2016 which includes the topic of electronic health records.  This provides immediate access to information in MyChart.  This includes consultation notes, operative notes, office notes, lab results and pathology reports.  If you have any questions about what you read please let 2017 know at your next visit so we can discuss your concerns and take corrective action if need be.  We are right here with you.

## 2020-07-11 ENCOUNTER — Telehealth (INDEPENDENT_AMBULATORY_CARE_PROVIDER_SITE_OTHER): Payer: Self-pay

## 2020-07-11 NOTE — Telephone Encounter (Signed)
appt tomorrow is being changed to a video visit

## 2020-07-12 ENCOUNTER — Encounter (INDEPENDENT_AMBULATORY_CARE_PROVIDER_SITE_OTHER): Payer: Self-pay | Admitting: Family Medicine

## 2020-07-12 ENCOUNTER — Other Ambulatory Visit: Payer: Self-pay

## 2020-07-12 ENCOUNTER — Telehealth (INDEPENDENT_AMBULATORY_CARE_PROVIDER_SITE_OTHER): Payer: BC Managed Care – PPO | Admitting: Family Medicine

## 2020-07-12 DIAGNOSIS — I10 Essential (primary) hypertension: Secondary | ICD-10-CM | POA: Diagnosis not present

## 2020-07-12 DIAGNOSIS — Z6834 Body mass index (BMI) 34.0-34.9, adult: Secondary | ICD-10-CM

## 2020-07-12 DIAGNOSIS — E8881 Metabolic syndrome: Secondary | ICD-10-CM

## 2020-07-12 DIAGNOSIS — E559 Vitamin D deficiency, unspecified: Secondary | ICD-10-CM | POA: Diagnosis not present

## 2020-07-12 DIAGNOSIS — Z9189 Other specified personal risk factors, not elsewhere classified: Secondary | ICD-10-CM | POA: Insufficient documentation

## 2020-07-12 DIAGNOSIS — R5383 Other fatigue: Secondary | ICD-10-CM

## 2020-07-12 DIAGNOSIS — E669 Obesity, unspecified: Secondary | ICD-10-CM

## 2020-07-12 MED ORDER — VITAMIN D (ERGOCALCIFEROL) 1.25 MG (50000 UNIT) PO CAPS: 50000.0000 [IU] | ORAL_CAPSULE | ORAL | 0 refills | Status: DC

## 2020-07-19 NOTE — Progress Notes (Signed)
TeleHealth Visit:  Due to the COVID-19 pandemic, this visit was completed with telemedicine (audio/video) technology to reduce patient and provider exposure as well as to preserve personal protective equipment.   Sheng has verbally consented to this TeleHealth visit. The patient is located at home, the provider is located at the [facility] office. The participants in this visit include the listed provider and patient and. The visit was conducted today via MyChart.  OBESITY Cheyenne is here to discuss her progress with her obesity treatment plan along with follow-up of her obesity related diagnoses.   Today's visit was #: 8 Starting weight: 245 lbs Starting date: 03/27/2020 Today's date: 07/12/2020  Interim History:  Vergie Living Terriquez is here for a follow up office visit.  she is following the meal plan without concern or issues.  Patient's meal and food recall appears to be accurate and consistent with what is on the plan.  When on plan, her hunger and cravings are well controlled.    Current Meal Plan: the Category 3 Plan for 75% of the time.  Current Exercise Plan: Circuit training for 35 minutes 6 times per week.  Assessment/Plan:   1. Essential hypertension Well controlled lately.  Medications: HCTZ 25 mg daily.  Asymptomatic and without concerns.  Plan: Avoid buying foods that are: processed, frozen, or prepackaged to avoid excess salt.  Blood pressure is at goal.  Continue prudent nutritional plan and weight loss along with exercise.  Obtain CMP at next office visit.  BP Readings from Last 3 Encounters:  06/27/20 110/70  06/12/20 (!) 167/89  05/29/20 (!) 132/56   Lab Results  Component Value Date   CREATININE 0.85 03/10/2020   2. Insulin resistance Not at goal. Goal is HgbA1c < 5.7, fasting insulin closer to 5.  Medication: None.    Plan:  A1c and FI at next office visit.  Continue exercise and prudent nutritional plan with goal of weight loss to  improve blood sugar levels.  She will continue to focus on protein-rich, low simple carbohydrate foods. We reviewed the importance of hydration, regular exercise for stress reduction, and restorative sleep.   Lab Results  Component Value Date   HGBA1C 5.5 03/27/2020   Lab Results  Component Value Date   INSULIN 13.2 03/27/2020   - Comprehensive metabolic panel - Insulin, random - Hemoglobin A1c  3. Other fatigue Energy levels have improved drastically since eating healthier, etc., however, she would still like to see improvement.  Plan:  Exercise, including cardio and weight lifting.  Continue prudent nutritional plan.  Check CBC, TSH, T3, Free T4, and vitamin B12 in the near future.  - CBC with Differential/Platelet - Vitamin B12 - T4, free - T3 - TSH  4. Vitamin D deficiency Not at goal. Current vitamin D is 36.0, tested on 03/27/2020. Optimal goal > 50 ng/dL.   Plan: Continue to take prescription Vitamin D @50 ,000 IU every week as prescribed.  Will check vitamin D level at next office visit.  - Refill Vitamin D, Ergocalciferol, (DRISDOL) 1.25 MG (50000 UNIT) CAPS capsule; Take 1 capsule (50,000 Units total) by mouth every 7 (seven) days.  Dispense: 4 capsule; Refill: 0 - VITAMIN D 25 Hydroxy (Vit-D Deficiency, Fractures)  5. Class 1 obesity with serious comorbidity and body mass index (BMI) of 34.0 to 34.9 in adult, unspecified obesity type  Course: Tamarah is currently in the action stage of change. As such, her goal is to continue with weight loss efforts.  Nutrition goals: She has agreed to the Category 3 Plan.   Exercise goals: For substantial health benefits, adults should do at least 150 minutes (2 hours and 30 minutes) a week of moderate-intensity, or 75 minutes (1 hour and 15 minutes) a week of vigorous-intensity aerobic physical activity, or an equivalent combination of moderate- and vigorous-intensity aerobic activity. Aerobic activity should be performed in  episodes of at least 10 minutes, and preferably, it should be spread throughout the week. Increase with doing cardio each time as well.  Behavioral modification strategies: increasing lean protein intake, decreasing simple carbohydrates, increasing water intake and keeping healthy foods in the home.  Jenia has agreed to follow-up with our clinic in 2-3 weeks with blood work at next office visit.  She ws told to come fasting if possible.  She was informed of the importance of frequent follow-up visits to maximize her success with intensive lifestyle modifications for her multiple health conditions.   Objective:   General: Cooperative, alert, well developed, in no acute distress. HEENT: Conjunctivae and lids unremarkable. Cardiovascular: Regular rhythm.  Lungs: Normal work of breathing. Neurologic: No focal deficits.   Lab Results  Component Value Date   CREATININE 0.85 03/10/2020   BUN 13 03/10/2020   NA 139 03/10/2020   K 4.3 03/10/2020   CL 102 03/10/2020   CO2 23 03/10/2020   Lab Results  Component Value Date   HGBA1C 5.5 03/27/2020   Lab Results  Component Value Date   INSULIN 13.2 03/27/2020   Lab Results  Component Value Date   TSH 1.640 03/10/2020   Lab Results  Component Value Date   CHOL 140 03/27/2020   HDL 61 03/27/2020   LDLCALC 65 03/27/2020   TRIG 69 03/27/2020   Lab Results  Component Value Date   WBC 8.4 11/13/2015   HGB 12.0 11/13/2015   HCT 36.4 11/13/2015   MCV 88.3 11/13/2015   PLT 314 11/13/2015   Attestation Statements:   Reviewed by clinician on day of visit: allergies, medications, problem list, medical history, surgical history, family history, social history, and previous encounter notes.  I, Insurance claims handler, CMA, am acting as Energy manager for [employer], DO.  I have reviewed the above documentation for accuracy and completeness, and I agree with the above. Carlye Grippe, D.O.  The 21st Century Cures Act was signed into  law in 2016 which includes the topic of electronic health records.  This provides immediate access to information in MyChart.  This includes consultation notes, operative notes, office notes, lab results and pathology reports.  If you have any questions about what you read please let us know at your next visit so we can discuss your concerns and take corrective action if need be.  We are right here with you.

## 2020-08-02 ENCOUNTER — Encounter (INDEPENDENT_AMBULATORY_CARE_PROVIDER_SITE_OTHER): Payer: Self-pay | Admitting: Family Medicine

## 2020-08-02 ENCOUNTER — Ambulatory Visit (INDEPENDENT_AMBULATORY_CARE_PROVIDER_SITE_OTHER): Payer: BC Managed Care – PPO | Admitting: Family Medicine

## 2020-08-02 ENCOUNTER — Other Ambulatory Visit: Payer: Self-pay

## 2020-08-02 VITALS — BP 113/78 | HR 88 | Temp 98.5°F | Ht 69.0 in | Wt 238.0 lb

## 2020-08-02 DIAGNOSIS — F509 Eating disorder, unspecified: Secondary | ICD-10-CM

## 2020-08-02 DIAGNOSIS — E559 Vitamin D deficiency, unspecified: Secondary | ICD-10-CM

## 2020-08-02 DIAGNOSIS — Z6835 Body mass index (BMI) 35.0-35.9, adult: Secondary | ICD-10-CM

## 2020-08-02 DIAGNOSIS — Z9189 Other specified personal risk factors, not elsewhere classified: Secondary | ICD-10-CM | POA: Diagnosis not present

## 2020-08-02 MED ORDER — TOPIRAMATE 25 MG PO TABS: 25.0000 mg | ORAL_TABLET | Freq: Every evening | ORAL | 0 refills | Status: DC

## 2020-08-02 MED ORDER — VITAMIN D (ERGOCALCIFEROL) 1.25 MG (50000 UNIT) PO CAPS: 50000.0000 [IU] | ORAL_CAPSULE | ORAL | 0 refills | Status: DC

## 2020-08-09 NOTE — Progress Notes (Signed)
Chief Complaint:   OBESITY Brittany Elliott is here to discuss her progress with her obesity treatment plan along with follow-up of her obesity related diagnoses.   Today's visit was #: 9 Starting weight: 245 lbs Starting date: 03/27/2020 Today's weight: 238 lbs Today's date: 08/02/2020 Total lbs lost to date: 7 lbs Body mass index is 35.15 kg/m.  Total weight loss percentage to date: -2.86%  Interim History:  Brittany Elliott's last office visit was on 07/12/2020.  She says she has had increased cravings and emotional eating due to increased stressors in life.  She can eat breakfast and lunch on plan, but is struggling with dinner as she eats with her sister and father.  Plan:  Focus on dinner and eat only half when going out.  Also can add protein snacks prior.  Increase water intake, slow down when eating.  Current Meal Plan: the Category 3 Plan for 50% of the time.  Current Exercise Plan: Circuit training for 35 minutes 6 times per week.  Assessment/Plan:   1. Vitamin D deficiency Not at goal. Current vitamin D is 36.0, tested on 03/27/2020. Optimal goal > 50 ng/dL.  She is taking vitamin D 50,000 IU weekly.  Plan: Continue to take prescription Vitamin D @50 ,000 IU every week as prescribed.  Follow-up for routine testing of Vitamin D, at least 2-3 times per year to avoid over-replacement.  - Refill Vitamin D, Ergocalciferol, (DRISDOL) 1.25 MG (50000 UNIT) CAPS capsule; Take 1 capsule (50,000 Units total) by mouth every 7 (seven) days.  Dispense: 4 capsule; Refill: 0  2. Eating disorder, unspecified type Not at goal. Medication: None.  She has had increased generalized cravings.  Plan:  Behavior modification techniques were discussed today to help deal with emotional/non-hunger eating behaviors.  Will start Topamax 25 mg at bedtime.  Prescription sent to pharmacy today.  Mindful eating handout given.  3. At risk for side effect of medication Due to [facility] current conditions and  medications, she is at a higher risk for drug side effect.  At least 10 minutes was spent on counseling her about these concerns today.  We discussed the benefits and potential risks of these medications, and all of patient's concerns were addressed and questions were answered.  she will call Brittany Elliott, or their PCP or other specialists who treat their conditions with medications, with any questions or concerns that may develop.    4. Class 2 severe obesity with serious comorbidity and body mass index (BMI) of 35.0 to 35.9 in adult, unspecified obesity type [facility])  Course: Brittany Elliott is currently in the action stage of change. As such, her goal is to continue with weight loss efforts.   Nutrition goals: She has agreed to the Category 3 Plan.   Exercise goals: As is.  Behavioral modification strategies: emotional eating strategies, avoiding temptations and planning for success.  [name] has agreed to follow-up with our clinic in 2-2.5 weeks. She was informed of the importance of frequent follow-up visits to maximize her success with intensive lifestyle modifications for her multiple health conditions.   Objective:   Blood pressure 113/78, pulse 88, temperature 98.5 F (36.9 C), height 5\' 9"  (1.753 m), weight 238 lb (108 kg), SpO2 100 %. Body mass index is 35.15 kg/m.  General: Cooperative, alert, well developed, in no acute distress. HEENT: Conjunctivae and lids unremarkable. Cardiovascular: Regular rhythm.  Lungs: Normal work of breathing. Neurologic: No focal deficits.   Lab Results  Component Value Date   CREATININE 0.85 03/10/2020  BUN 13 03/10/2020   NA 139 03/10/2020   K 4.3 03/10/2020   CL 102 03/10/2020   CO2 23 03/10/2020   Lab Results  Component Value Date   HGBA1C 5.5 03/27/2020   Lab Results  Component Value Date   INSULIN 13.2 03/27/2020   Lab Results  Component Value Date   TSH 1.640 03/10/2020   Lab Results  Component Value Date   CHOL 140 03/27/2020   HDL 61  03/27/2020   LDLCALC 65 03/27/2020   TRIG 69 03/27/2020   Lab Results  Component Value Date   WBC 8.4 11/13/2015   HGB 12.0 11/13/2015   HCT 36.4 11/13/2015   MCV 88.3 11/13/2015   PLT 314 11/13/2015   Attestation Statements:   Reviewed by clinician on day of visit: allergies, medications, problem list, medical history, surgical history, family history, social history, and previous encounter notes.  I, Insurance claims handler, CMA, am acting as Energy manager for [employer], DO.  I have reviewed the above documentation for accuracy and completeness, and I agree with the above. Carlye Grippe, D.O.  The 21st Century Cures Act was signed into law in 2016 which includes the topic of electronic health records.  This provides immediate access to information in MyChart.  This includes consultation notes, operative notes, office notes, lab results and pathology reports.  If you have any questions about what you read please let us know at your next visit so we can discuss your concerns and take corrective action if need be.  We are right here with you.

## 2020-08-21 ENCOUNTER — Ambulatory Visit (INDEPENDENT_AMBULATORY_CARE_PROVIDER_SITE_OTHER): Payer: BC Managed Care – PPO | Admitting: Family Medicine

## 2020-08-24 ENCOUNTER — Other Ambulatory Visit (INDEPENDENT_AMBULATORY_CARE_PROVIDER_SITE_OTHER): Payer: Self-pay | Admitting: Family Medicine

## 2020-08-24 DIAGNOSIS — F509 Eating disorder, unspecified: Secondary | ICD-10-CM

## 2020-08-29 ENCOUNTER — Ambulatory Visit (INDEPENDENT_AMBULATORY_CARE_PROVIDER_SITE_OTHER): Payer: BC Managed Care – PPO | Admitting: Family Medicine

## 2020-09-11 ENCOUNTER — Encounter (INDEPENDENT_AMBULATORY_CARE_PROVIDER_SITE_OTHER): Payer: Self-pay | Admitting: Bariatrics

## 2020-09-11 ENCOUNTER — Ambulatory Visit (INDEPENDENT_AMBULATORY_CARE_PROVIDER_SITE_OTHER): Payer: BC Managed Care – PPO | Admitting: Bariatrics

## 2020-09-11 ENCOUNTER — Other Ambulatory Visit: Payer: Self-pay

## 2020-09-11 VITALS — BP 124/81 | HR 77 | Temp 98.6°F | Ht 69.0 in | Wt 242.0 lb

## 2020-09-11 DIAGNOSIS — F509 Eating disorder, unspecified: Secondary | ICD-10-CM | POA: Diagnosis not present

## 2020-09-11 DIAGNOSIS — E559 Vitamin D deficiency, unspecified: Secondary | ICD-10-CM | POA: Diagnosis not present

## 2020-09-11 DIAGNOSIS — I10 Essential (primary) hypertension: Secondary | ICD-10-CM | POA: Diagnosis not present

## 2020-09-11 DIAGNOSIS — Z9189 Other specified personal risk factors, not elsewhere classified: Secondary | ICD-10-CM

## 2020-09-11 DIAGNOSIS — Z6836 Body mass index (BMI) 36.0-36.9, adult: Secondary | ICD-10-CM

## 2020-09-11 MED ORDER — VITAMIN D (ERGOCALCIFEROL) 1.25 MG (50000 UNIT) PO CAPS: 50000.0000 [IU] | ORAL_CAPSULE | ORAL | 0 refills | Status: DC

## 2020-09-12 ENCOUNTER — Encounter (INDEPENDENT_AMBULATORY_CARE_PROVIDER_SITE_OTHER): Payer: Self-pay | Admitting: Bariatrics

## 2020-09-12 NOTE — Progress Notes (Signed)
Chief Complaint:   OBESITY Brittany Elliott is here to discuss her progress with her obesity treatment plan along with follow-up of her obesity related diagnoses. Brittany Elliott is on the Category 3 Plan and states she is following her eating plan approximately 50% of the time. Brittany Elliott states she is doing circuit training for 30 minutes 6 times per week.  Today's visit was #: 10 Starting weight: 245 lbs Starting date: 03/27/2020 Today's weight: 242 lbs Today's date: 09/11/2020 Total lbs lost to date: 3 lbs Total lbs lost since last in-office visit: 0  Interim History: Brittany Elliott is up 4 pounds since her last visit about 1 month ago.  Subjective:   1. Vitamin D deficiency Brittany Elliott's Vitamin D level was 36.0 on 03/27/2020. She is currently taking prescription vitamin D 50,000 IU each week. She denies nausea, vomiting or muscle weakness.  2. Essential hypertension Reasonably well controlled.  Review: taking medications as instructed, no medication side effects noted, no chest pain on exertion, no dyspnea on exertion, no swelling of ankles.  Taking HCTZ.  BP Readings from Last 3 Encounters:  09/11/20 124/81  08/02/20 113/78  06/27/20 110/70   3. Eating disorder, unspecified type Started Topamax, but stopped due to concerns.  4. At risk for osteoporosis Brittany Elliott is at higher risk of osteopenia and osteoporosis due to Vitamin D deficiency.   Assessment/Plan:   1. Vitamin D deficiency Low Vitamin D level contributes to fatigue and are associated with obesity, breast, and colon cancer. She agrees to continue to take prescription Vitamin D @50 ,000 IU every week and will follow-up for routine testing of Vitamin D, at least 2-3 times per year to avoid over-replacement.  - Refill Vitamin D, Ergocalciferol, (DRISDOL) 1.25 MG (50000 UNIT) CAPS capsule; Take 1 capsule (50,000 Units total) by mouth every 7 (seven) days.  Dispense: 4 capsule; Refill: 0  2. Essential hypertension Brittany Elliott is working on  healthy weight loss and exercise to improve blood pressure control. We will watch for signs of hypotension as she continues her lifestyle modifications.  Continue medication.  3. Eating disorder, unspecified type Will resume Topamax (has medication at home).  4. At risk for osteoporosis Brittany Elliott was given approximately 15 minutes of osteoporosis prevention counseling today. Brittany Elliott is at risk for osteopenia and osteoporosis due to her Vitamin D deficiency. She was encouraged to take her Vitamin D and follow her higher calcium diet and increase strengthening exercise to help strengthen her bones and decrease her risk of osteopenia and osteoporosis.  Repetitive spaced learning was employed today to elicit superior memory formation and behavioral change.  5. Obesity, current BMI 35  Brittany Elliott is currently in the action stage of change. As such, her goal is to continue with weight loss efforts. She has agreed to the Category 3 Plan.   Exercise goals: For substantial health benefits, adults should do at least 150 minutes (2 hours and 30 minutes) a week of moderate-intensity, or 75 minutes (1 hour and 15 minutes) a week of vigorous-intensity aerobic physical activity, or an equivalent combination of moderate- and vigorous-intensity aerobic activity. Aerobic activity should be performed in episodes of at least 10 minutes, and preferably, it should be spread throughout the week.   She will work on meal planning, will be more adherent to the plan, will increase her water intake, and will eat out less.  Behavioral modification strategies: increasing lean protein intake, decreasing simple carbohydrates, increasing vegetables, increasing water intake, decreasing eating out, no skipping meals, meal planning and cooking strategies,  keeping healthy foods in the home and planning for success.  [name] has agreed to follow-up with our clinic in 2-3 weeks with Dr. Sharee Holster. She was informed of the importance of  frequent follow-up visits to maximize her success with intensive lifestyle modifications for her multiple health conditions.   Objective:   Blood pressure 124/81, pulse 77, temperature 98.6 F (37 C), height 5\' 9"  (1.753 m), weight 242 lb (109.8 kg), SpO2 100 %. Body mass index is 35.74 kg/m.  General: Cooperative, alert, well developed, in no acute distress. HEENT: Conjunctivae and lids unremarkable. Cardiovascular: Regular rhythm.  Lungs: Normal work of breathing. Neurologic: No focal deficits.   Lab Results  Component Value Date   CREATININE 0.85 03/10/2020   BUN 13 03/10/2020   NA 139 03/10/2020   K 4.3 03/10/2020   CL 102 03/10/2020   CO2 23 03/10/2020   Lab Results  Component Value Date   HGBA1C 5.5 03/27/2020   Lab Results  Component Value Date   INSULIN 13.2 03/27/2020   Lab Results  Component Value Date   TSH 1.640 03/10/2020   Lab Results  Component Value Date   CHOL 140 03/27/2020   HDL 61 03/27/2020   LDLCALC 65 03/27/2020   TRIG 69 03/27/2020   Lab Results  Component Value Date   WBC 8.4 11/13/2015   HGB 12.0 11/13/2015   HCT 36.4 11/13/2015   MCV 88.3 11/13/2015   PLT 314 11/13/2015   Attestation Statements:   Reviewed by clinician on day of visit: allergies, medications, problem list, medical history, surgical history, family history, social history, and previous encounter notes.  I, 11/15/2015, CMA, am acting as Insurance claims handler for Energy manager, DO  I have reviewed the above documentation for accuracy and completeness, and I agree with the above. [employer], DO

## 2020-10-02 ENCOUNTER — Ambulatory Visit (INDEPENDENT_AMBULATORY_CARE_PROVIDER_SITE_OTHER): Payer: BC Managed Care – PPO | Admitting: Physician Assistant

## 2020-10-07 ENCOUNTER — Other Ambulatory Visit (INDEPENDENT_AMBULATORY_CARE_PROVIDER_SITE_OTHER): Payer: Self-pay | Admitting: Bariatrics

## 2020-10-07 DIAGNOSIS — E559 Vitamin D deficiency, unspecified: Secondary | ICD-10-CM

## 2020-10-09 ENCOUNTER — Ambulatory Visit: Payer: BC Managed Care – PPO | Attending: Internal Medicine | Admitting: Internal Medicine

## 2020-10-09 ENCOUNTER — Other Ambulatory Visit: Payer: Self-pay

## 2020-10-09 ENCOUNTER — Encounter: Payer: Self-pay | Admitting: Internal Medicine

## 2020-10-09 VITALS — BP 120/70 | HR 83 | Resp 16 | Ht 68.0 in | Wt 248.8 lb

## 2020-10-09 DIAGNOSIS — Z1159 Encounter for screening for other viral diseases: Secondary | ICD-10-CM

## 2020-10-09 DIAGNOSIS — E669 Obesity, unspecified: Secondary | ICD-10-CM

## 2020-10-09 DIAGNOSIS — Z23 Encounter for immunization: Secondary | ICD-10-CM

## 2020-10-09 DIAGNOSIS — I10 Essential (primary) hypertension: Secondary | ICD-10-CM | POA: Diagnosis not present

## 2020-10-09 MED ORDER — HYDROCHLOROTHIAZIDE 25 MG PO TABS: 25.0000 mg | ORAL_TABLET | Freq: Every day | ORAL | 1 refills | Status: DC

## 2020-10-09 NOTE — Telephone Encounter (Signed)
Pt last seen by Dr. Brown.  

## 2020-10-09 NOTE — Telephone Encounter (Signed)
Refill request

## 2020-10-09 NOTE — Progress Notes (Signed)
Patient ID: Brittany Elliott, female    DOB: 1985-06-20  MRN: [id number]  CC: Hypertension   Subjective: Brittany Elliott is a 35 y.o. female who presents for chronic ds management Her concerns today include:  PT with hx of HTN, obesity  HM:  Had pap 06/27/2020 through her gynecologist.  She reports that it was normal.  Due for Tdap, hep C screening.  HTN:  Has device at home but not checking regularly -trying to limit salt in foods. Compliant with med HCTZ.  Does not complain of any chest pains or shortness of breath.  Obesity:  Followed at [facility] Was down to 233 in 05/2020 but has since gained 16 pounds.  She attributes this to sliding back on her eating habits.  She plans to do better.  Still working out about 5 to 6 days a week at the gym.  Has follow-up appointment with medical weight management tomorrow. Patient Active Problem List   Diagnosis Date Noted  . Eating disorder 08/02/2020  . At risk for side effect of medication 08/02/2020  . Fatigue 07/12/2020  . At risk for diabetes mellitus 07/12/2020  . At risk for malnutrition 06/27/2020  . Insulin resistance 04/25/2020  . Vitamin D deficiency 04/25/2020  . Essential hypertension 04/25/2020  . At risk for impaired metabolic function 04/25/2020  . Class 2 obesity due to excess calories with body mass index (BMI) of 36.0 to 36.9 in adult 03/28/2020  . Hirsutism 06/17/2018  . Chlamydia infection affecting pregnancy in first trimester, antepartum 05/12/2014  . BV (bacterial vaginosis) 05/09/2014  . Spotting affecting pregnancy in first trimester 05/09/2014  . Elevated blood pressure affecting pregnancy in first trimester, antepartum 05/09/2014  . Subchorionic hemorrhage in first trimester - small 05/09/2014  . Hx of metrorrhagia   . Metrorrhagia 03/31/2012     Current Outpatient Medications on File Prior to Visit  Medication Sig Dispense Refill  . hydrochlorothiazide (HYDRODIURIL) 25 MG tablet Take 1  tablet (25 mg total) by mouth daily. 90 tablet 1  . NUVARING 0.12-0.015 MG/24HR vaginal ring Place 1 each vaginally every 21 ( twenty-one) days.    Marland Kitchen topiramate (TOPAMAX) 25 MG tablet Take 1 tablet (25 mg total) by mouth at bedtime. 30 tablet 0  . Vitamin D, Ergocalciferol, (DRISDOL) 1.25 MG (50000 UNIT) CAPS capsule Take 1 capsule (50,000 Units total) by mouth every 7 (seven) days. 4 capsule 0   No current facility-administered medications on file prior to visit.    No Known Allergies  Social History   Socioeconomic History  . Marital status: Single    Spouse name: Not on file  . Number of children: Not on file  . Years of education: Not on file  . Highest education level: Not on file  Occupational History  . Occupation: Runner, broadcasting/film/video  Tobacco Use  . Smoking status: Never Smoker  . Smokeless tobacco: Never Used  Vaping Use  . Vaping Use: Never used  Substance and Sexual Activity  . Alcohol use: Yes    Comment: occasional  . Drug use: No  . Sexual activity: Yes  Other Topics Concern  . Not on file  Social History Narrative  . Not on file   Social Determinants of Health   Financial Resource Strain: Not on file  Food Insecurity: Not on file  Transportation Needs: Not on file  Physical Activity: Not on file  Stress: Not on file  Social Connections: Not on file  Intimate Partner Violence: Not on file  Family History  Problem Relation Age of Onset  . Hypertension Mother   . Diabetes Mother   . Kidney disease Mother   . Heart disease Mother   . High blood pressure Father     Past Surgical History:  Procedure Laterality Date  . INDUCED ABORTION      ROS: Review of Systems Negative except as stated above  PHYSICAL EXAM: BP 130/83   Pulse 83   Resp 16   Ht 5\' 8"  (1.727 m)   Wt 248 lb 12.8 oz (112.9 kg)   SpO2 100%   BMI 37.83 kg/m   Wt Readings from Last 3 Encounters:  10/09/20 248 lb 12.8 oz (112.9 kg)  09/11/20 242 lb (109.8 kg)  08/02/20 238 lb (108  kg)    Physical Exam  General appearance - alert, well appearing, and in no distress Mental status - normal mood, behavior, speech, dress, motor activity, and thought processes Neck - supple, no significant adenopathy Chest - clear to auscultation, no wheezes, rales or rhonchi, symmetric air entry Heart - normal rate, regular rhythm, normal S1, S2, no murmurs, rubs, clicks or gallops Extremities - peripheral pulses normal, no pedal edema, no clubbing or cyanosis    CMP Latest Ref Rng & Units 03/10/2020 05/08/2018 04/21/2018  Glucose 65 - 99 mg/dL 84 98 95  BUN 6 - 20 mg/dL 13 12 12   Creatinine 0.57 - 1.00 mg/dL 04/23/2018 4.97  Sodium 134 - 144 mmol/L 139 141 138  Potassium 3.5 - 5.2 mmol/L 4.3 3.9 3.6  Chloride 96 - 106 mmol/L 102 104 107  CO2 20 - 29 mmol/L 23 22 24   Calcium 8.7 - 10.2 mg/dL 9.6 9.3 9.0   Lipid Panel     Component Value Date/Time   CHOL 140 03/27/2020 0903   TRIG 69 03/27/2020 0903   HDL 61 03/27/2020 0903   LDLCALC 65 03/27/2020 0903    CBC    Component Value Date/Time   WBC 8.4 11/13/2015 1601   RBC 4.12 11/13/2015 1601   HGB 12.0 11/13/2015 1601   HCT 36.4 11/13/2015 1601   PLT 314 11/13/2015 1601   MCV 88.3 11/13/2015 1601   MCH 29.1 11/13/2015 1601   MCHC 33.0 11/13/2015 1601   RDW 13.3 11/13/2015 1601    ASSESSMENT AND PLAN: 1. Essential hypertension At goal.  Continue hydrochlorothiazide.  Strongly encourage cutting back on salt.  Try to check blood pressure at least once every 2 weeks with goal being 130/80 or lower. - hydrochlorothiazide (HYDRODIURIL) 25 MG tablet; Take 1 tablet (25 mg total) by mouth daily.  Dispense: 90 tablet; Refill: 1  2. Obesity (BMI 35.0-39.9 without comorbidity) Patient still going to the gym 5 to 6 days a week but has problems controlling her eating.  Advised that she speak with her weight management provider about perhaps adding medication to help curb her appetite.  3. Need for hepatitis C screening test -  Hepatitis C Antibody  4. Need for Tdap given   Patient was given the opportunity to ask questions.  Patient verbalized understanding of the plan and was able to repeat key elements of the plan.   Orders Placed This Encounter  Procedures  . Tdap vaccine greater than or equal to 7yo IM     Requested Prescriptions    No prescriptions requested or ordered in this encounter    No follow-ups on file.  11/15/2015, MD, FACP

## 2020-10-09 NOTE — Patient Instructions (Signed)
Consider asking your weight management physician about medication to help curb your appetite.  Continue regular exercise.  Continue your blood pressure medication hydrochlorothiazide.  Try to check the blood pressure at least once every 2 weeks.  The goal is 130/80 or lower.

## 2020-10-10 ENCOUNTER — Other Ambulatory Visit: Payer: Self-pay

## 2020-10-10 ENCOUNTER — Encounter (INDEPENDENT_AMBULATORY_CARE_PROVIDER_SITE_OTHER): Payer: Self-pay | Admitting: Family Medicine

## 2020-10-10 ENCOUNTER — Ambulatory Visit (INDEPENDENT_AMBULATORY_CARE_PROVIDER_SITE_OTHER): Payer: BC Managed Care – PPO | Admitting: Family Medicine

## 2020-10-10 VITALS — BP 121/78 | HR 78 | Temp 97.7°F | Ht 68.0 in | Wt 244.0 lb

## 2020-10-10 DIAGNOSIS — E559 Vitamin D deficiency, unspecified: Secondary | ICD-10-CM

## 2020-10-10 DIAGNOSIS — Z9189 Other specified personal risk factors, not elsewhere classified: Secondary | ICD-10-CM

## 2020-10-10 DIAGNOSIS — Z6836 Body mass index (BMI) 36.0-36.9, adult: Secondary | ICD-10-CM | POA: Diagnosis not present

## 2020-10-10 DIAGNOSIS — F3289 Other specified depressive episodes: Secondary | ICD-10-CM

## 2020-10-10 LAB — HEPATITIS C ANTIBODY: Hep C Virus Ab: <0.1 s/co ratio (ref 0.0–0.9)

## 2020-10-10 MED ORDER — TOPIRAMATE 25 MG PO TABS: 25.0000 mg | ORAL_TABLET | Freq: Every evening | ORAL | 0 refills | Status: DC

## 2020-10-10 NOTE — Progress Notes (Signed)
Chief Complaint:   OBESITY Shenoa is here to discuss her progress with her obesity treatment plan along with follow-up of her obesity related diagnoses. Destenie is on the Category 3 Plan and states she is following her eating plan approximately 30% of the time. Bitania states she is circuit training for 40 minutes 4 times per week.  Today's visit was #: 11 Starting weight: 245 lbs Starting date: 03/27/2020 Today's weight: 244 lbs Today's date: 10/10/2020 Total lbs lost to date: 1 Total lbs lost since last in-office visit: 0  Interim History: Carlei has struggled to stay on track with her eating plan, especially in the PM. She has increase stress in her life and has limited time to meal plan  especially for dinner.  Subjective:   1. Vitamin D deficiency Arieonna is stable on Vit D, and she denies signs of over-replacement.  2. Other depression with emotional eating Timberlynn was started on Topamax but she notes it makes her sleepy even at low doses. No other side effects noted.  3. At risk for heart disease Niya is at a higher than average risk for cardiovascular disease due to obesity.   Assessment/Plan:   1. Vitamin D deficiency Low Vitamin D level contributes to fatigue and are associated with obesity, breast, and colon cancer. Danyal agreed to continue taking Vitamin D and will follow-up for routine testing of Vitamin D, at least 2-3 times per year to avoid over-replacement.  2. Other depression with emotional eating Behavior modification techniques were discussed today to help [facility] deal with her emotional/non-hunger eating behaviors. We will refill Topamax for 1 month. Tymeshia is to move her dose to dinner time to decrease morning fatigue. If this doesn't help then we will need to look at other options. Orders and follow up as documented in patient record.   - topiramate (TOPAMAX) 25 MG tablet; Take 1 tablet (25 mg total) by mouth at bedtime.  Dispense: 30  tablet; Refill: 0  3. At risk for heart disease Sirena was given approximately 15 minutes of coronary artery disease prevention counseling today. She is 35 y.o. female and has risk factors for heart disease including obesity. We discussed intensive lifestyle modifications today with an emphasis on specific weight loss instructions and strategies.   Repetitive spaced learning was employed today to elicit superior memory formation and behavioral change.  4. Obesity with current BMI 37.2 Correen is currently in the action stage of change. As such, her goal is to continue with weight loss efforts. She has agreed to the Category 3 Plan and keeping a food journal and adhering to recommended goals of 400-550 calories and 40+ grams of protein at supper daily.   Eating out smarter handout was given.  Exercise goals: As is.  Behavioral modification strategies: increasing lean protein intake.  Lexy has agreed to follow-up with our clinic in 2 weeks. She was informed of the importance of frequent follow-up visits to maximize her success with intensive lifestyle modifications for her multiple health conditions.   Objective:   Blood pressure 121/78, pulse 78, temperature 97.7 F (36.5 C), height 5\' 8"  (1.727 m), weight 244 lb (110.7 kg), SpO2 99 %. Body mass index is 37.1 kg/m.  General: Cooperative, alert, well developed, in no acute distress. HEENT: Conjunctivae and lids unremarkable. Cardiovascular: Regular rhythm.  Lungs: Normal work of breathing. Neurologic: No focal deficits.   Lab Results  Component Value Date   CREATININE 0.85 03/10/2020   BUN 13 03/10/2020   NA  139 03/10/2020   K 4.3 03/10/2020   CL 102 03/10/2020   CO2 23 03/10/2020   No results found for: ALT, AST, GGT, ALKPHOS, BILITOT Lab Results  Component Value Date   HGBA1C 5.5 03/27/2020   Lab Results  Component Value Date   INSULIN 13.2 03/27/2020   Lab Results  Component Value Date   TSH 1.640 03/10/2020    Lab Results  Component Value Date   CHOL 140 03/27/2020   HDL 61 03/27/2020   LDLCALC 65 03/27/2020   TRIG 69 03/27/2020   Lab Results  Component Value Date   WBC 8.4 11/13/2015   HGB 12.0 11/13/2015   HCT 36.4 11/13/2015   MCV 88.3 11/13/2015   PLT 314 11/13/2015   No results found for: IRON, TIBC, FERRITIN  Attestation Statements:   Reviewed by clinician on day of visit: allergies, medications, problem list, medical history, surgical history, family history, social history, and previous encounter notes.   I, Burt Knack, am acting as transcriptionist for Quillian Quince, MD.  I have reviewed the above documentation for accuracy and completeness, and I agree with the above. -  Quillian Quince, MD

## 2020-10-23 ENCOUNTER — Other Ambulatory Visit (INDEPENDENT_AMBULATORY_CARE_PROVIDER_SITE_OTHER): Payer: Self-pay | Admitting: Family Medicine

## 2020-10-23 DIAGNOSIS — F3289 Other specified depressive episodes: Secondary | ICD-10-CM

## 2020-10-25 ENCOUNTER — Ambulatory Visit (INDEPENDENT_AMBULATORY_CARE_PROVIDER_SITE_OTHER): Payer: BC Managed Care – PPO | Admitting: Family Medicine

## 2020-10-25 ENCOUNTER — Other Ambulatory Visit: Payer: Self-pay

## 2020-10-25 ENCOUNTER — Encounter (INDEPENDENT_AMBULATORY_CARE_PROVIDER_SITE_OTHER): Payer: Self-pay | Admitting: Family Medicine

## 2020-10-25 VITALS — BP 125/82 | HR 74 | Temp 98.5°F | Ht 68.0 in | Wt 243.0 lb

## 2020-10-25 DIAGNOSIS — Z9189 Other specified personal risk factors, not elsewhere classified: Secondary | ICD-10-CM

## 2020-10-25 DIAGNOSIS — E559 Vitamin D deficiency, unspecified: Secondary | ICD-10-CM | POA: Diagnosis not present

## 2020-10-25 DIAGNOSIS — Z6836 Body mass index (BMI) 36.0-36.9, adult: Secondary | ICD-10-CM | POA: Diagnosis not present

## 2020-10-25 MED ORDER — VITAMIN D (ERGOCALCIFEROL) 1.25 MG (50000 UNIT) PO CAPS
50000.0000 [IU] | ORAL_CAPSULE | ORAL | 0 refills | Status: DC
Start: 2020-10-25 — End: 2020-11-22

## 2020-10-31 NOTE — Progress Notes (Signed)
Chief Complaint:   OBESITY Brittany Elliott is here to discuss her progress with her obesity treatment plan along with follow-up of her obesity related diagnoses. [name] is on the Category 3 Plan and keeping a food journal and adhering to recommended goals of 400-550 calories and 40+ grams of protein at super daily and states she is following her eating plan approximately 50% of the time. [name] states she is walking and doing circuit training for 40 minutes 5 times per week.  Today's visit was #: 12 Starting weight: 245 lbs Starting date: 03/27/2020 Today's weight: 243 lbs Today's date: 10/25/2020 Total lbs lost to date: 2 Total lbs lost since last in-office visit: 1  Interim History: [name] continues to do well with weight loss. She is exercise 5 times per week, and doing cardio and strengthening. She is journaling for dinner, but feels she does better when she follows her Category 3 overall.   Subjective:   1. Vitamin D deficiency [name] is stable on Vit D, and she denies nausea or vomiting. Her level is not yet at goal.  2. At risk for osteoporosis [name] is at higher risk of osteopenia and osteoporosis due to Vitamin D deficiency.   Assessment/Plan:   1. Vitamin D deficiency Low Vitamin D level contributes to fatigue and are associated with obesity, breast, and colon cancer. We will refill prescription Vitamin D for 1 month. [name] will follow-up for routine testing of Vitamin D, at least 2-3 times per year to avoid over-replacement.  - Vitamin D, Ergocalciferol, (DRISDOL) 1.25 MG (50000 UNIT) CAPS capsule; Take 1 capsule (50,000 Units total) by mouth every 7 (seven) days.  Dispense: 4 capsule; Refill: 0  2. At risk for osteoporosis [name] was given approximately 15 minutes of osteoporosis prevention counseling today. [name] is at risk for osteopenia and osteoporosis due to her Vitamin D deficiency. She was encouraged to take her Vitamin D and follow her higher  calcium diet and increase strengthening exercise to help strengthen her bones and decrease her risk of osteopenia and osteoporosis.  Repetitive spaced learning was employed today to elicit superior memory formation and behavioral change.  3. Obesity with current BMI 37.0 [name] is currently in the action stage of change. As such, her goal is to continue with weight loss efforts. She has agreed to the Category 3 Plan.   Exercise goals: As is.  Behavioral modification strategies: increasing lean protein intake.  [name] has agreed to follow-up with our clinic in 2 to 3 weeks. She was informed of the importance of frequent follow-up visits to maximize her success with intensive lifestyle modifications for her multiple health conditions.   Objective:   Blood pressure 125/82, pulse 74, temperature 98.5 F (36.9 C), height 5\' 8"  (1.727 m), weight 243 lb (110.2 kg), SpO2 99 %. Body mass index is 36.95 kg/m.  General: Cooperative, alert, well developed, in no acute distress. HEENT: Conjunctivae and lids unremarkable. Cardiovascular: Regular rhythm.  Lungs: Normal work of breathing. Neurologic: No focal deficits.   Lab Results  Component Value Date   CREATININE 0.85 03/10/2020   BUN 13 03/10/2020   NA 139 03/10/2020   K 4.3 03/10/2020   CL 102 03/10/2020   CO2 23 03/10/2020   No results found for: ALT, AST, GGT, ALKPHOS, BILITOT Lab Results  Component Value Date   HGBA1C 5.5 03/27/2020   Lab Results  Component Value Date   INSULIN 13.2 03/27/2020   Lab Results  Component Value Date   TSH 1.640  03/10/2020   Lab Results  Component Value Date   CHOL 140 03/27/2020   HDL 61 03/27/2020   LDLCALC 65 03/27/2020   TRIG 69 03/27/2020   Lab Results  Component Value Date   WBC 8.4 11/13/2015   HGB 12.0 11/13/2015   HCT 36.4 11/13/2015   MCV 88.3 11/13/2015   PLT 314 11/13/2015   No results found for: IRON, TIBC, FERRITIN  Attestation Statements:   Reviewed by  clinician on day of visit: allergies, medications, problem list, medical history, surgical history, family history, social history, and previous encounter notes.   I, Burt Knack, am acting as transcriptionist for Quillian Quince, MD.  I have reviewed the above documentation for accuracy and completeness, and I agree with the above. -  Quillian Quince, MD

## 2020-11-08 ENCOUNTER — Encounter (INDEPENDENT_AMBULATORY_CARE_PROVIDER_SITE_OTHER): Payer: Self-pay | Admitting: Family Medicine

## 2020-11-08 ENCOUNTER — Ambulatory Visit (INDEPENDENT_AMBULATORY_CARE_PROVIDER_SITE_OTHER): Payer: BC Managed Care – PPO | Admitting: Family Medicine

## 2020-11-08 ENCOUNTER — Other Ambulatory Visit: Payer: Self-pay

## 2020-11-08 VITALS — BP 128/80 | HR 88 | Temp 98.0°F | Ht 69.0 in | Wt 244.0 lb

## 2020-11-08 DIAGNOSIS — E559 Vitamin D deficiency, unspecified: Secondary | ICD-10-CM

## 2020-11-08 DIAGNOSIS — Z9189 Other specified personal risk factors, not elsewhere classified: Secondary | ICD-10-CM | POA: Diagnosis not present

## 2020-11-08 DIAGNOSIS — F3289 Other specified depressive episodes: Secondary | ICD-10-CM

## 2020-11-08 DIAGNOSIS — Z6837 Body mass index (BMI) 37.0-37.9, adult: Secondary | ICD-10-CM

## 2020-11-08 MED ORDER — TOPIRAMATE 25 MG PO TABS: 25.0000 mg | ORAL_TABLET | Freq: Every evening | ORAL | 0 refills | Status: DC

## 2020-11-20 NOTE — Progress Notes (Signed)
Chief Complaint:   OBESITY Brittany Elliott is here to discuss her progress with her obesity treatment plan along with follow-up of her obesity related diagnoses. Brittany Elliott is on the Category 3 Plan and states she is following her eating plan approximately 60% of the time. Brittany Elliott states she is doing circuit training for 40 minutes 4 times per week.  Today's visit was #: 13 Starting weight: 245 lbs Starting date: 03/27/2020 Today's weight: 244 lbs Today's date: 11/08/2020 Total lbs lost to date: 1 Total lbs lost since last in-office visit: 0  Interim History: Brittany Elliott is retaining fluid today. She has been working on diet, exercise, and weight loss, but she is struggling a bit more recently. She is still exercising regularly and her hunger is mostly controlled. She is working on meal planning and prepping.  Subjective:   1. Vitamin D deficiency Brittany Elliott is stable on Vit D, but her level is not yet at goal. She denies nausea or vomiting.  2. Other depression with emotional eating Brittany Elliott is doing well on Topamax. She may have enough to make it to her next appointment, but I am not certain. She continues to work on minimizing emotional eating behaviors.   3. At risk for osteoporosis Brittany Elliott is at higher risk of osteopenia and osteoporosis due to Vitamin D deficiency.   Assessment/Plan:   1. Vitamin D deficiency Low Vitamin D level contributes to fatigue and are associated with obesity, breast, and colon cancer. Brittany Elliott agreed to continue taking prescription Vitamin D 50,000 IU every week and will follow-up for routine testing of Vitamin D, at least 2-3 times per year to avoid over-replacement.  2. Other depression with emotional eating Behavior modification techniques were discussed today to help Brittany Elliott deal with her emotional/non-hunger eating behaviors. We will refill Topamax for 1 month. Orders and follow up as documented in patient record.   - topiramate (TOPAMAX) 25 MG  tablet; Take 1 tablet (25 mg total) by mouth at bedtime.  Dispense: 30 tablet; Refill: 0  3. At risk for osteoporosis Brittany Elliott was given approximately 15 minutes of osteoporosis prevention counseling today. Brittany Elliott is at risk for osteopenia and osteoporosis due to her Vitamin D deficiency. She was encouraged to take her Vitamin D and follow her higher calcium diet and increase strengthening exercise to help strengthen her bones and decrease her risk of osteopenia and osteoporosis.  Repetitive spaced learning was employed today to elicit superior memory formation and behavioral change.  4. Obesity with current BMI 36.2 Brittany Elliott is currently in the action stage of change. As such, her goal is to continue with weight loss efforts. She has agreed to the Category 3 Plan.   Exercise goals: As is.  Behavioral modification strategies: increasing lean protein intake and meal planning and cooking strategies.  Brittany Elliott has agreed to follow-up with our clinic in 3 weeks. She was informed of the importance of frequent follow-up visits to maximize her success with intensive lifestyle modifications for her multiple health conditions.   Objective:   Blood pressure 128/80, pulse 88, temperature 98 F (36.7 C), height 5\' 9"  (1.753 m), weight 244 lb (110.7 kg), SpO2 98 %. Body mass index is 36.03 kg/m.  General: Cooperative, alert, well developed, in no acute distress. HEENT: Conjunctivae and lids unremarkable. Cardiovascular: Regular rhythm.  Lungs: Normal work of breathing. Neurologic: No focal deficits.   Lab Results  Component Value Date   CREATININE 0.85 03/10/2020   BUN 13 03/10/2020   NA 139 03/10/2020   K  4.3 03/10/2020   CL 102 03/10/2020   CO2 23 03/10/2020   No results found for: ALT, AST, GGT, ALKPHOS, BILITOT Lab Results  Component Value Date   HGBA1C 5.5 03/27/2020   Lab Results  Component Value Date   INSULIN 13.2 03/27/2020   Lab Results  Component Value Date   TSH  1.640 03/10/2020   Lab Results  Component Value Date   CHOL 140 03/27/2020   HDL 61 03/27/2020   LDLCALC 65 03/27/2020   TRIG 69 03/27/2020   Lab Results  Component Value Date   WBC 8.4 11/13/2015   HGB 12.0 11/13/2015   HCT 36.4 11/13/2015   MCV 88.3 11/13/2015   PLT 314 11/13/2015   No results found for: IRON, TIBC, FERRITIN  Attestation Statements:   Reviewed by clinician on day of visit: allergies, medications, problem list, medical history, surgical history, family history, social history, and previous encounter notes.   I, Burt Knack, am acting as transcriptionist for Quillian Quince, MD.  I have reviewed the above documentation for accuracy and completeness, and I agree with the above. -  Quillian Quince, MD

## 2020-11-22 ENCOUNTER — Ambulatory Visit (INDEPENDENT_AMBULATORY_CARE_PROVIDER_SITE_OTHER): Payer: BC Managed Care – PPO | Admitting: Family Medicine

## 2020-11-22 ENCOUNTER — Encounter (INDEPENDENT_AMBULATORY_CARE_PROVIDER_SITE_OTHER): Payer: Self-pay | Admitting: Family Medicine

## 2020-11-22 ENCOUNTER — Other Ambulatory Visit: Payer: Self-pay

## 2020-11-22 VITALS — BP 114/72 | HR 86 | Temp 98.0°F | Ht 69.0 in | Wt 240.0 lb

## 2020-11-22 DIAGNOSIS — E669 Obesity, unspecified: Secondary | ICD-10-CM | POA: Diagnosis not present

## 2020-11-22 DIAGNOSIS — I10 Essential (primary) hypertension: Secondary | ICD-10-CM

## 2020-11-22 DIAGNOSIS — E559 Vitamin D deficiency, unspecified: Secondary | ICD-10-CM | POA: Diagnosis not present

## 2020-11-22 DIAGNOSIS — Z9189 Other specified personal risk factors, not elsewhere classified: Secondary | ICD-10-CM

## 2020-11-22 DIAGNOSIS — Z6832 Body mass index (BMI) 32.0-32.9, adult: Secondary | ICD-10-CM

## 2020-11-22 DIAGNOSIS — E66811 Obesity, class 1: Secondary | ICD-10-CM

## 2020-11-22 MED ORDER — VITAMIN D (ERGOCALCIFEROL) 1.25 MG (50000 UNIT) PO CAPS: 50000.0000 [IU] | ORAL_CAPSULE | ORAL | 0 refills | Status: DC

## 2020-11-29 NOTE — Progress Notes (Signed)
Chief Complaint:   OBESITY Brittany Elliott is here to discuss her progress with her obesity treatment plan along with follow-up of her obesity related diagnoses. Brittany Elliott is on the Category 3 Plan and states she is following her eating plan approximately 60% of the time. Brittany Elliott states she is doing circuit training for 40 minutes 6 times per week.  Today's visit was #: 14 Starting weight: 245 lbs Starting date: 03/27/2020 Today's weight: 240 lbs Today's date: 11/22/2020 Total lbs lost to date: 5 Total lbs lost since last in-office visit: 4  Interim History: Brittany Elliott continues to do well with weight loss. She has some indulgences but she has gotten back on track. She is going on vacation soon and she is open to discussing strategies.  Subjective:   1. Vitamin D deficiency Brittany Elliott is stable on Vit D, but her level is not yet at goal. She denies signs of over-replacement.  2. Essential hypertension Brittany Elliott's blood pressure is well controlled on hydrochlorothiazide. She is doing well with diet and weight loss.  3. At risk for impaired metabolic function Brittany Elliott is at increased risk for impaired metabolic function if protein decreases.  Assessment/Plan:   1. Vitamin D deficiency Low Vitamin D level contributes to fatigue and are associated with obesity, breast, and colon cancer. We will refill prescription Vitamin D for 1 month. Brittany Elliott will follow-up for routine testing of Vitamin D, at least 2-3 times per year to avoid over-replacement.  - Vitamin D, Ergocalciferol, (DRISDOL) 1.25 MG (50000 UNIT) CAPS capsule; Take 1 capsule (50,000 Units total) by mouth every 7 (seven) days.  Dispense: 4 capsule; Refill: 0  2. Essential hypertension Brittany Elliott will continue diet, exercise, and her medications to improve blood pressure control. We will watch for signs of hypotension as she continues her lifestyle modifications.  3. At risk for impaired metabolic function Brittany Elliott was given  approximately 15 minutes of impaired  metabolic function prevention counseling today. We discussed intensive lifestyle modifications today with an emphasis on specific nutrition and exercise instructions and strategies.   Repetitive spaced learning was employed today to elicit superior memory formation and behavioral change.  4. Obesity with current BMI 35.5 Brittany Elliott is currently in the action stage of change. As such, her goal is to continue with weight loss efforts. She has agreed to the Category 3 Plan.   Exercise goals: As is.  Behavioral modification strategies: increasing lean protein intake, meal planning and cooking strategies, and travel eating strategies.  Brittany Elliott has agreed to follow-up with our clinic in 2 to 3 weeks. She was informed of the importance of frequent follow-up visits to maximize her success with intensive lifestyle modifications for her multiple health conditions.   Objective:   Blood pressure 114/72, pulse 86, temperature 98 F (36.7 C), height 5\' 9"  (1.753 m), weight 240 lb (108.9 kg), SpO2 99 %. Body mass index is 35.44 kg/m.  General: Cooperative, alert, well developed, in no acute distress. HEENT: Conjunctivae and lids unremarkable. Cardiovascular: Regular rhythm.  Lungs: Normal work of breathing. Neurologic: No focal deficits.   Lab Results  Component Value Date   CREATININE 0.85 03/10/2020   BUN 13 03/10/2020   NA 139 03/10/2020   K 4.3 03/10/2020   CL 102 03/10/2020   CO2 23 03/10/2020   No results found for: ALT, AST, GGT, ALKPHOS, BILITOT Lab Results  Component Value Date   HGBA1C 5.5 03/27/2020   Lab Results  Component Value Date   INSULIN 13.2 03/27/2020   Lab Results  Component Value Date   TSH 1.640 03/10/2020   Lab Results  Component Value Date   CHOL 140 03/27/2020   HDL 61 03/27/2020   LDLCALC 65 03/27/2020   TRIG 69 03/27/2020   Lab Results  Component Value Date   VD25OH 36.0 03/27/2020   VD25OH 20 (L) 03/31/2012    Lab Results  Component Value Date   WBC 8.4 11/13/2015   HGB 12.0 11/13/2015   HCT 36.4 11/13/2015   MCV 88.3 11/13/2015   PLT 314 11/13/2015   No results found for: IRON, TIBC, FERRITIN  Attestation Statements:   Reviewed by clinician on day of visit: allergies, medications, problem list, medical history, surgical history, family history, social history, and previous encounter notes.   I, Burt Knack, am acting as transcriptionist for Quillian Quince, MD.  I have reviewed the above documentation for accuracy and completeness, and I agree with the above. -  Quillian Quince, MD

## 2020-12-07 ENCOUNTER — Encounter (INDEPENDENT_AMBULATORY_CARE_PROVIDER_SITE_OTHER): Payer: Self-pay | Admitting: Family Medicine

## 2020-12-07 ENCOUNTER — Ambulatory Visit (INDEPENDENT_AMBULATORY_CARE_PROVIDER_SITE_OTHER): Payer: BC Managed Care – PPO | Admitting: Family Medicine

## 2020-12-07 ENCOUNTER — Other Ambulatory Visit: Payer: Self-pay

## 2020-12-07 VITALS — BP 109/74 | HR 89 | Temp 98.3°F | Ht 69.0 in | Wt 242.0 lb

## 2020-12-07 DIAGNOSIS — E559 Vitamin D deficiency, unspecified: Secondary | ICD-10-CM | POA: Diagnosis not present

## 2020-12-07 DIAGNOSIS — Z6836 Body mass index (BMI) 36.0-36.9, adult: Secondary | ICD-10-CM | POA: Diagnosis not present

## 2020-12-07 DIAGNOSIS — F3289 Other specified depressive episodes: Secondary | ICD-10-CM | POA: Diagnosis not present

## 2020-12-07 DIAGNOSIS — Z9189 Other specified personal risk factors, not elsewhere classified: Secondary | ICD-10-CM

## 2020-12-07 MED ORDER — VITAMIN D (ERGOCALCIFEROL) 1.25 MG (50000 UNIT) PO CAPS: 50000.0000 [IU] | ORAL_CAPSULE | ORAL | 0 refills | Status: DC

## 2020-12-07 MED ORDER — BD PEN NEEDLE NANO U/F 32G X 4 MM MISC: 0 refills | Status: DC

## 2020-12-07 MED ORDER — SAXENDA 18 MG/3ML ~~LOC~~ SOPN: 3.0000 mg | PEN_INJECTOR | Freq: Every day | SUBCUTANEOUS | 0 refills | Status: DC

## 2020-12-11 ENCOUNTER — Encounter (INDEPENDENT_AMBULATORY_CARE_PROVIDER_SITE_OTHER): Payer: Self-pay

## 2020-12-18 NOTE — Progress Notes (Signed)
Chief Complaint:   OBESITY Brittany Elliott is here to discuss her progress with her obesity treatment plan along with follow-up of her obesity related diagnoses. Brittany Elliott is on the Category 3 Plan and states she is following her eating plan approximately 50% of the time. Brittany Elliott states she is doing circuit training for 30 minutes 4 times per week.  Today's visit was #: 15 Starting weight: 245 lbs Starting date: 03/27/2020 Today's weight: 242 lbs Today's date: 12/07/2020 Total lbs lost to date: 3 Total lbs lost since last in-office visit: 0  Interim History: Brittany Elliott did some celebration eating over the 4th of July and she struggled to stay on the plan. She is open to discussing other medication options to help her with weight loss.  Subjective:   1. Vitamin D deficiency Brittany Elliott is stable on Vit D, and she denies nausea, vomiting, or muscle weakness.  2. Other depression with emotional eating Brittany Elliott tried Topamax and even at low doses felt sleeping in the daytime, so she had to stop. She still struggles with emotional eating behaviors and neurologic hunger.  3. At risk for diabetes mellitus Brittany Elliott is at higher than average risk for developing diabetes due to obesity.   Assessment/Plan:   1. Vitamin D deficiency Low Vitamin D level contributes to fatigue and are associated with obesity, breast, and colon cancer. We will refill prescription Vitamin D for 1 month. Brittany Elliott will follow-up for routine testing of Vitamin D, at least 2-3 times per year to avoid over-replacement.  - Vitamin D, Ergocalciferol, (DRISDOL) 1.25 MG (50000 UNIT) CAPS capsule; Take 1 capsule (50,000 Units total) by mouth every 7 (seven) days.  Dispense: 4 capsule; Refill: 0  2. Other depression with emotional eating Aliz agreed to discontinue Topamax and emotional eating behaviors were discussed today to help her deal with her emotional/non-hunger eating behaviors. She will continue to follow up as  directed. Orders and follow up as documented in patient record.   3. At risk for diabetes mellitus Brittany Elliott was given approximately 15 minutes of diabetes education and counseling today. We discussed intensive lifestyle modifications today with an emphasis on weight loss as well as increasing exercise and decreasing simple carbohydrates in her diet. We also reviewed medication options with an emphasis on risk versus benefit of those discussed.   Repetitive spaced learning was employed today to elicit superior memory formation and behavioral change.  4. Obesity with current BMI 35.8 Brittany Elliott is currently in the action stage of change. As such, her goal is to continue with weight loss efforts. She has agreed to the Category 3 Plan.   We discussed various medication options to help Brittany Elliott with her weight loss efforts and we both agreed to start [name] 3.0 mg with no refills (she is to start at 0.6 mg), and nano needles #100 with no refills.  - Liraglutide -Weight Management ([name]) 18 MG/3ML SOPN; Inject 3 mg into the skin daily. Start at 0.6 daily  Dispense: 15 mL; Refill: 0 - Insulin Pen Needle (BD PEN NEEDLE NANO U/F) 32G X 4 MM MISC; Use Nano needle with Ozempic  Dispense: 100 each; Refill: 0  Exercise goals: As is.  Behavioral modification strategies: increasing lean protein intake and meal planning and cooking strategies.  Brittany Elliott has agreed to follow-up with our clinic in 2 to 3 weeks. She was informed of the importance of frequent follow-up visits to maximize her success with intensive lifestyle modifications for her multiple health conditions.   Objective:   Blood  pressure 109/74, pulse 89, temperature 98.3 F (36.8 C), height 5\' 9"  (1.753 m), weight 242 lb (109.8 kg), SpO2 99 %. Body mass index is 35.74 kg/m.  General: Cooperative, alert, well developed, in no acute distress. HEENT: Conjunctivae and lids unremarkable. Cardiovascular: Regular rhythm.  Lungs: Normal work of  breathing. Neurologic: No focal deficits.   Lab Results  Component Value Date   CREATININE 0.85 03/10/2020   BUN 13 03/10/2020   NA 139 03/10/2020   K 4.3 03/10/2020   CL 102 03/10/2020   CO2 23 03/10/2020   No results found for: ALT, AST, GGT, ALKPHOS, BILITOT Lab Results  Component Value Date   HGBA1C 5.5 03/27/2020   Lab Results  Component Value Date   INSULIN 13.2 03/27/2020   Lab Results  Component Value Date   TSH 1.640 03/10/2020   Lab Results  Component Value Date   CHOL 140 03/27/2020   HDL 61 03/27/2020   LDLCALC 65 03/27/2020   TRIG 69 03/27/2020   Lab Results  Component Value Date   VD25OH 36.0 03/27/2020   VD25OH 20 (L) 03/31/2012   Lab Results  Component Value Date   WBC 8.4 11/13/2015   HGB 12.0 11/13/2015   HCT 36.4 11/13/2015   MCV 88.3 11/13/2015   PLT 314 11/13/2015   No results found for: IRON, TIBC, FERRITIN  Attestation Statements:   Reviewed by clinician on day of visit: allergies, medications, problem list, medical history, surgical history, family history, social history, and previous encounter notes.   I, 11/15/2015, am acting as transcriptionist for Burt Knack, MD.  I have reviewed the above documentation for accuracy and completeness, and I agree with the above. -  Quillian Quince, MD

## 2020-12-25 ENCOUNTER — Ambulatory Visit (INDEPENDENT_AMBULATORY_CARE_PROVIDER_SITE_OTHER): Payer: BC Managed Care – PPO | Admitting: Family Medicine

## 2020-12-25 ENCOUNTER — Other Ambulatory Visit: Payer: Self-pay

## 2020-12-25 ENCOUNTER — Encounter (INDEPENDENT_AMBULATORY_CARE_PROVIDER_SITE_OTHER): Payer: Self-pay | Admitting: Family Medicine

## 2020-12-25 VITALS — BP 122/77 | HR 81 | Temp 98.2°F | Ht 69.0 in | Wt 245.0 lb

## 2020-12-25 DIAGNOSIS — E559 Vitamin D deficiency, unspecified: Secondary | ICD-10-CM

## 2020-12-25 DIAGNOSIS — Z6836 Body mass index (BMI) 36.0-36.9, adult: Secondary | ICD-10-CM | POA: Diagnosis not present

## 2020-12-25 DIAGNOSIS — Z9189 Other specified personal risk factors, not elsewhere classified: Secondary | ICD-10-CM | POA: Diagnosis not present

## 2020-12-25 DIAGNOSIS — I10 Essential (primary) hypertension: Secondary | ICD-10-CM | POA: Diagnosis not present

## 2020-12-27 NOTE — Progress Notes (Signed)
Chief Complaint:   OBESITY Brittany Elliott is here to discuss her progress with her obesity treatment plan along with follow-up of her obesity related diagnoses. Brittany Elliott is on the Category 3 Plan and states she is following her eating plan approximately 40% of the time. Brittany Elliott states she is doing circuit training for 35 minutes 4 times per week.  Today's visit was #: 16 Starting weight: 245 lbs Starting date: 03/27/2020 Today's weight: 245 lbs Today's date: 12/25/2020 Total lbs lost to date: 0 Total lbs lost since last in-office visit: 0  Interim History: Brittany Elliott is retaining fluid today and her weight is up due to this. She notes increased temptations at home, and decreased meal planning. She tolerates [name] but she has noticed any change in her appetite yet.  Subjective:   1. Essential hypertension Brittany Elliott's blood pressure is well controlled on hydrochlorothiazide. She has a small amount of fluid retention that may be hormonally related.  2. Vitamin D deficiency Brittany Elliott is stable on Vit D, and she denies signs of over-replacement.  3. At risk for dehydration Brittany Elliott is at risk for dehydration due to inadequate water intake.  Assessment/Plan:   1. Essential hypertension Brittany Elliott will continue with diet and exercise to improve blood pressure control. We will watch for signs of hypotension as she continues her lifestyle modifications.  2. Vitamin D deficiency Low Vitamin D level contributes to fatigue and are associated with obesity, breast, and colon cancer. Brittany Elliott will continue prescription Vitamin D 50,000 IU every week and will follow-up for routine testing of Vitamin D, at least 2-3 times per year to avoid over-replacement.  3. At risk for dehydration Brittany Elliott was given approximately 15 minutes dehydration prevention counseling today. Brittany Elliott is at risk for dehydration due to weight loss and current medication(s). She was encouraged to hydrate and monitor fluid status  to avoid dehydration as well as weight loss plateaus.   4. Obesity with current BMI 36.3 Brittany Elliott is currently in the action stage of change. As such, her goal is to continue with weight loss efforts. She has agreed to the Category 3 Plan.   We discussed various medication options to help [facility] with her weight loss efforts and we both agreed to increase [name] to 1.2 mg.  Exercise goals: As is.  Behavioral modification strategies: dealing with family or coworker sabotage.  Brittany Elliott has agreed to follow-up with our clinic in 2 weeks. She was informed of the importance of frequent follow-up visits to maximize her success with intensive lifestyle modifications for her multiple health conditions.   Objective:   Blood pressure 122/77, pulse 81, temperature 98.2 F (36.8 C), height 5\' 9"  (1.753 m), weight 245 lb (111.1 kg), SpO2 99 %. Body mass index is 36.18 kg/m.  General: Cooperative, alert, well developed, in no acute distress. HEENT: Conjunctivae and lids unremarkable. Cardiovascular: Regular rhythm.  Lungs: Normal work of breathing. Neurologic: No focal deficits.   Lab Results  Component Value Date   CREATININE 0.85 03/10/2020   BUN 13 03/10/2020   NA 139 03/10/2020   K 4.3 03/10/2020   CL 102 03/10/2020   CO2 23 03/10/2020   No results found for: ALT, AST, GGT, ALKPHOS, BILITOT Lab Results  Component Value Date   HGBA1C 5.5 03/27/2020   Lab Results  Component Value Date   INSULIN 13.2 03/27/2020   Lab Results  Component Value Date   TSH 1.640 03/10/2020   Lab Results  Component Value Date   CHOL 140 03/27/2020  HDL 61 03/27/2020   LDLCALC 65 03/27/2020   TRIG 69 03/27/2020   Lab Results  Component Value Date   VD25OH 36.0 03/27/2020   VD25OH 20 (L) 03/31/2012   Lab Results  Component Value Date   WBC 8.4 11/13/2015   HGB 12.0 11/13/2015   HCT 36.4 11/13/2015   MCV 88.3 11/13/2015   PLT 314 11/13/2015   No results found for: IRON, TIBC,  FERRITIN  Attestation Statements:   Reviewed by clinician on day of visit: allergies, medications, problem list, medical history, surgical history, family history, social history, and previous encounter notes.   I, Burt Knack, am acting as transcriptionist for Quillian Quince, MD.  I have reviewed the above documentation for accuracy and completeness, and I agree with the above. -  Quillian Quince, MD

## 2021-01-07 ENCOUNTER — Other Ambulatory Visit (INDEPENDENT_AMBULATORY_CARE_PROVIDER_SITE_OTHER): Payer: Self-pay | Admitting: Family Medicine

## 2021-01-08 ENCOUNTER — Encounter (INDEPENDENT_AMBULATORY_CARE_PROVIDER_SITE_OTHER): Payer: Self-pay | Admitting: Emergency Medicine

## 2021-01-08 NOTE — Telephone Encounter (Signed)
Mychart msg has been sent.

## 2021-01-11 ENCOUNTER — Ambulatory Visit (INDEPENDENT_AMBULATORY_CARE_PROVIDER_SITE_OTHER): Payer: BC Managed Care – PPO | Admitting: Family Medicine

## 2021-01-11 ENCOUNTER — Other Ambulatory Visit: Payer: Self-pay

## 2021-01-11 ENCOUNTER — Encounter (INDEPENDENT_AMBULATORY_CARE_PROVIDER_SITE_OTHER): Payer: Self-pay | Admitting: Family Medicine

## 2021-01-11 VITALS — BP 117/78 | HR 98 | Temp 98.1°F | Ht 69.0 in | Wt 234.0 lb

## 2021-01-11 DIAGNOSIS — E559 Vitamin D deficiency, unspecified: Secondary | ICD-10-CM | POA: Diagnosis not present

## 2021-01-11 DIAGNOSIS — K5900 Constipation, unspecified: Secondary | ICD-10-CM

## 2021-01-11 DIAGNOSIS — Z6836 Body mass index (BMI) 36.0-36.9, adult: Secondary | ICD-10-CM

## 2021-01-11 DIAGNOSIS — E88819 Insulin resistance, unspecified: Secondary | ICD-10-CM

## 2021-01-11 DIAGNOSIS — E8881 Metabolic syndrome: Secondary | ICD-10-CM

## 2021-01-11 DIAGNOSIS — Z9189 Other specified personal risk factors, not elsewhere classified: Secondary | ICD-10-CM

## 2021-01-11 MED ORDER — POLYETHYLENE GLYCOL 3350 17 GM/SCOOP PO POWD: 17.0000 g | Freq: Two times a day (BID) | ORAL | 1 refills | Status: AC | PRN

## 2021-01-12 LAB — CMP14+EGFR
ALT: 11 IU/L (ref 0–32)
AST: 6 IU/L (ref 0–40)
Albumin/Globulin Ratio: 1.3 (ref 1.2–2.2)
Albumin: 3.9 g/dL (ref 3.8–4.8)
Alkaline Phosphatase: 37 IU/L — ABNORMAL LOW (ref 44–121)
BUN/Creatinine Ratio: 15 (ref 9–23)
BUN: 12 mg/dL (ref 6–20)
Bilirubin Total: 0.6 mg/dL (ref 0.0–1.2)
CO2: 21 mmol/L (ref 20–29)
Calcium: 9.4 mg/dL (ref 8.7–10.2)
Chloride: 102 mmol/L (ref 96–106)
Creatinine, Ser: 0.82 mg/dL (ref 0.57–1.00)
Globulin, Total: 2.9 g/dL (ref 1.5–4.5)
Glucose: 84 mg/dL (ref 65–99)
Potassium: 3.6 mmol/L (ref 3.5–5.2)
Sodium: 139 mmol/L (ref 134–144)
Total Protein: 6.8 g/dL (ref 6.0–8.5)
eGFR: 97 mL/min/{1.73_m2} (ref >59)

## 2021-01-12 LAB — HEMOGLOBIN A1C
Est. average glucose Bld gHb Est-mCnc: 108 mg/dL
Hgb A1c MFr Bld: 5.4 % (ref 4.8–5.6)

## 2021-01-12 LAB — INSULIN, RANDOM: INSULIN: 14.2 u[IU]/mL (ref 2.6–24.9)

## 2021-01-12 LAB — VITAMIN D 25 HYDROXY (VIT D DEFICIENCY, FRACTURES): Vit D, 25-Hydroxy: 80.8 ng/mL (ref 30.0–100.0)

## 2021-01-15 NOTE — Progress Notes (Signed)
Chief Complaint:   OBESITY Brittany Elliott is here to discuss her progress with her obesity treatment plan along with follow-up of her obesity related diagnoses. Brittany Elliott is on the Category 2 Plan and states she is following her eating plan approximately 40% of the time. Brittany Elliott states she is doing 0 minutes 0 times per week.  Today's visit was #: 67 Starting weight: 245 lbs Starting date: 03/27/2020 Today's weight: 234 lbs Today's date: 01/11/2021 Total lbs lost to date: 11 Total lbs lost since last in-office visit: 11  Interim History: Brittany Elliott has done very well with weight loss, but she hasn't been eating much due to grieving the unexpected loss of a friend. She is stable on [name] and she denies nausea or vomiting.  Subjective:   1. Constipation, unspecified constipation type Brittany Elliott has a new diagnosis of constipation. She notes her BM are less frequent and more painful, worse since starting GLP-1.  2. Vitamin D deficiency Brittany Elliott is stable on Vit D, and she is due for labs.  3. Insulin resistance Brittany Elliott is stable on diet and exercise, and she is due for labs. She denies hypoglycemia.  4. At risk for impaired metabolic function Brittany Elliott is at increased risk for impaired metabolic function due to decreased protein.  Assessment/Plan:   1. Constipation, unspecified constipation type Brittany Elliott agreed to start miralax 17 grams q daily with no refills. Orders and follow up as documented in patient record.   - polyethylene glycol powder (GLYCOLAX/MIRALAX) 17 GM/SCOOP powder; Take 17 g by mouth 2 (two) times daily as needed.  Dispense: 3350 g; Refill: 1  2. Vitamin D deficiency Low Vitamin D level contributes to fatigue and are associated with obesity, breast, and colon cancer. We will check labs today, Brittany Elliott will follow-up for routine testing of Vitamin D, at least 2-3 times per year to avoid over-replacement.  - VITAMIN D 25 Hydroxy (Vit-D Deficiency, Fractures)  3.  Insulin resistance Brittany Elliott will continue to work on weight loss, exercise, and decreasing simple carbohydrates to help decrease the risk of diabetes. We will check labs today. Brittany Elliott agreed to follow-up with [name] as directed to closely monitor her progress.  - CMP14+EGFR - Insulin, random - Hemoglobin A1c  4. At risk for impaired metabolic function Brittany Elliott was given approximately 15 minutes of impaired  metabolic function prevention counseling today. We discussed intensive lifestyle modifications today with an emphasis on specific nutrition and exercise instructions and strategies.   Repetitive spaced learning was employed today to elicit superior memory formation and behavioral change.  5. Obesity with current BMI 35.8 Brittany Elliott is currently in the action stage of change. As such, her goal is to continue with weight loss efforts. She has agreed to the Category 3 Plan.   We discussed various medication options to help Brittany Elliott with her weight loss efforts and we both agreed to continue [name] 1.2 mg and will follow up as directed.  Behavioral modification strategies: increasing lean protein intake and no skipping meals.  Brittany Elliott has agreed to follow-up with our clinic in 3 weeks. She was informed of the importance of frequent follow-up visits to maximize her success with intensive lifestyle modifications for her multiple health conditions.   Brittany Elliott was informed we would discuss her lab results at her next visit unless there is a critical issue that needs to be addressed sooner. Brittany Elliott agreed to keep her next visit at the agreed upon time to discuss these results.  Objective:   Blood pressure 117/78, pulse 98, temperature  98.1 F (36.7 C), height 5' 9" (1.753 m), weight 234 lb (106.1 kg), SpO2 99 %. Body mass index is 34.56 kg/m.  General: Cooperative, alert, well developed, in no acute distress. HEENT: Conjunctivae and lids unremarkable. Cardiovascular: Regular rhythm.  Lungs:  Normal work of breathing. Neurologic: No focal deficits.   Lab Results  Component Value Date   CREATININE 0.82 01/11/2021   BUN 12 01/11/2021   NA 139 01/11/2021   K 3.6 01/11/2021   CL 102 01/11/2021   CO2 21 01/11/2021   Lab Results  Component Value Date   ALT 11 01/11/2021   AST 6 01/11/2021   ALKPHOS 37 (L) 01/11/2021   BILITOT 0.6 01/11/2021   Lab Results  Component Value Date   HGBA1C 5.4 01/11/2021   HGBA1C 5.5 03/27/2020   Lab Results  Component Value Date   INSULIN 14.2 01/11/2021   INSULIN 13.2 03/27/2020   Lab Results  Component Value Date   TSH 1.640 03/10/2020   Lab Results  Component Value Date   CHOL 140 03/27/2020   HDL 61 03/27/2020   LDLCALC 65 03/27/2020   TRIG 69 03/27/2020   Lab Results  Component Value Date   VD25OH 80.8 01/11/2021   VD25OH 36.0 03/27/2020   VD25OH 20 (L) 03/31/2012   Lab Results  Component Value Date   WBC 8.4 11/13/2015   HGB 12.0 11/13/2015   HCT 36.4 11/13/2015   MCV 88.3 11/13/2015   PLT 314 11/13/2015   No results found for: IRON, TIBC, FERRITIN  Attestation Statements:   Reviewed by clinician on day of visit: allergies, medications, problem list, medical history, surgical history, family history, social history, and previous encounter notes.   I, Sharon Martin, am acting as transcriptionist for Caren Beasley, MD.  I have reviewed the above documentation for accuracy and completeness, and I agree with the above. -  Caren Beasley, MD   

## 2021-01-25 ENCOUNTER — Other Ambulatory Visit: Payer: Self-pay

## 2021-01-25 ENCOUNTER — Ambulatory Visit (INDEPENDENT_AMBULATORY_CARE_PROVIDER_SITE_OTHER): Payer: BC Managed Care – PPO | Admitting: Family Medicine

## 2021-01-25 ENCOUNTER — Encounter (INDEPENDENT_AMBULATORY_CARE_PROVIDER_SITE_OTHER): Payer: Self-pay | Admitting: Family Medicine

## 2021-01-25 VITALS — BP 111/77 | HR 96 | Temp 98.8°F | Ht 69.0 in | Wt 236.0 lb

## 2021-01-25 DIAGNOSIS — E669 Obesity, unspecified: Secondary | ICD-10-CM

## 2021-01-25 DIAGNOSIS — Z6836 Body mass index (BMI) 36.0-36.9, adult: Secondary | ICD-10-CM | POA: Diagnosis not present

## 2021-01-25 DIAGNOSIS — Z9189 Other specified personal risk factors, not elsewhere classified: Secondary | ICD-10-CM | POA: Diagnosis not present

## 2021-01-25 DIAGNOSIS — I10 Essential (primary) hypertension: Secondary | ICD-10-CM

## 2021-01-25 DIAGNOSIS — E559 Vitamin D deficiency, unspecified: Secondary | ICD-10-CM | POA: Diagnosis not present

## 2021-01-25 MED ORDER — SAXENDA 18 MG/3ML ~~LOC~~ SOPN: 1.8000 mg | PEN_INJECTOR | Freq: Every day | SUBCUTANEOUS | 0 refills | Status: DC

## 2021-01-25 MED ORDER — VITAMIN D (ERGOCALCIFEROL) 1.25 MG (50000 UNIT) PO CAPS: 50000.0000 [IU] | ORAL_CAPSULE | ORAL | 0 refills | Status: DC

## 2021-01-25 NOTE — Progress Notes (Signed)
Chief Complaint:   OBESITY Brittany Elliott is here to discuss her progress with her obesity treatment plan along with follow-up of her obesity related diagnoses. Brittany Elliott is on the Category 3 Plan and states she is following her eating plan approximately 40% of the time. Brittany Elliott states she is doing circuit training for 30 minutes 5 times per week.  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  Subjective:   1. Vitamin D deficiency Brittany Elliott's Vit D level is now at goal. She is at high risk of over-replacement.  2. Essential hypertension Brittany Elliott's blood pressure is stable on her medications, and no hypotensions was noted. She is working on weight loss.  3. At risk for impaired metabolic function Brittany Elliott is at increased risk for impaired metabolic function if protein decreases.  Assessment/Plan:   1. Vitamin D deficiency Low Vitamin D level contributes to fatigue and are associated with obesity, breast, and colon cancer. We will refill prescription Vitamin D for 1 month, [place] is to take 50,000 IU every 2 weeks now). She will follow-up for routine testing of Vitamin D, at least 2-3 times per year to avoid over-replacement.  - Vitamin D, Ergocalciferol, (DRISDOL) 1.25 MG (50000 UNIT) CAPS capsule; Take 1 capsule (50,000 Units total) by mouth every 7 (seven) days.  Dispense: 4 capsule; Refill: 0  2. Essential hypertension Brittany Elliott will continue hydrochlorothiazide, diet, and exercise to improve blood pressure control. Will continue to monitor for hypotension as she continues her lifestyle modifications.  3. At risk for impaired metabolic function Brittany Elliott was given  approximately 15 minutes of impaired  metabolic function prevention counseling today. We discussed intensive lifestyle modifications today with an emphasis on specific nutrition and exercise instructions and strategies.   Repetitive spaced learning was employed today to elicit superior memory formation and behavioral change.  4. Obesity with current BMI 35.0 Brittany Elliott is currently in the action stage of change. As such, her goal is to continue with weight loss efforts. She has agreed to the Category 3 Plan.   We discussed various medication options to help [facility] with her weight loss efforts and we both agreed to continues [name], and increase to 1.8 mg gradually #5 pens with no refills.  - Liraglutide -Weight Management ([name]) 18 MG/3ML SOPN; Inject 1.8 mg into the skin daily.  Dispense: 15 mL; Refill: 0  Exercise goals: As is.  Behavioral modification strategies: meal planning and cooking strategies.  Brittany Elliott has agreed to follow-up with our clinic in 3 weeks. She was informed of the importance of frequent follow-up visits to maximize her success with intensive lifestyle modifications for her multiple health conditions.   Objective:   Blood pressure 111/77, pulse 96, temperature 98.8 F (37.1 C), height 5\' 9"  (1.753 m), weight 236 lb (107 kg), SpO2 98 %. Body mass index is 34.85 kg/m.  General: Cooperative, alert, well developed, in no acute distress. HEENT: Conjunctivae and lids unremarkable. Cardiovascular: Regular rhythm.  Lungs: Normal work of breathing. Neurologic: No focal deficits.   Lab Results  Component Value Date   CREATININE 0.82 01/11/2021   BUN 12 01/11/2021   NA 139 01/11/2021   K 3.6 01/11/2021   CL 102 01/11/2021  CO2 21 01/11/2021   Lab Results  Component Value Date   ALT 11 01/11/2021   AST 6 01/11/2021   ALKPHOS 37 (L) 01/11/2021   BILITOT 0.6 01/11/2021   Lab Results  Component Value Date   HGBA1C 5.4 01/11/2021   HGBA1C 5.5 03/27/2020    Lab Results  Component Value Date   INSULIN 14.2 01/11/2021   INSULIN 13.2 03/27/2020   Lab Results  Component Value Date   TSH 1.640 03/10/2020   Lab Results  Component Value Date   CHOL 140 03/27/2020   HDL 61 03/27/2020   LDLCALC 65 03/27/2020   TRIG 69 03/27/2020   Lab Results  Component Value Date   VD25OH 80.8 01/11/2021   VD25OH 36.0 03/27/2020   VD25OH 20 (L) 03/31/2012   Lab Results  Component Value Date   WBC 8.4 11/13/2015   HGB 12.0 11/13/2015   HCT 36.4 11/13/2015   MCV 88.3 11/13/2015   PLT 314 11/13/2015   No results found for: IRON, TIBC, FERRITIN  Attestation Statements:   Reviewed by clinician on day of visit: allergies, medications, problem list, medical history, surgical history, family history, social history, and previous encounter notes.   I, Burt Knack, am acting as transcriptionist for Quillian Quince, MD.  I have reviewed the above documentation for accuracy and completeness, and I agree with the above. -  Quillian Quince, MD

## 2021-02-15 ENCOUNTER — Ambulatory Visit (INDEPENDENT_AMBULATORY_CARE_PROVIDER_SITE_OTHER): Payer: BC Managed Care – PPO | Admitting: Family Medicine

## 2021-02-15 ENCOUNTER — Other Ambulatory Visit: Payer: Self-pay

## 2021-02-15 ENCOUNTER — Encounter (INDEPENDENT_AMBULATORY_CARE_PROVIDER_SITE_OTHER): Payer: Self-pay | Admitting: Family Medicine

## 2021-02-15 VITALS — BP 123/73 | HR 88 | Temp 98.1°F | Ht 69.0 in | Wt 233.0 lb

## 2021-02-15 DIAGNOSIS — Z6836 Body mass index (BMI) 36.0-36.9, adult: Secondary | ICD-10-CM | POA: Diagnosis not present

## 2021-02-15 DIAGNOSIS — I10 Essential (primary) hypertension: Secondary | ICD-10-CM | POA: Diagnosis not present

## 2021-02-15 MED ORDER — SAXENDA 18 MG/3ML ~~LOC~~ SOPN: 1.8000 mg | PEN_INJECTOR | Freq: Every day | SUBCUTANEOUS | 0 refills | Status: DC

## 2021-02-15 MED ORDER — BD PEN NEEDLE NANO U/F 32G X 4 MM MISC: 0 refills | Status: DC

## 2021-02-15 NOTE — Progress Notes (Signed)
Chief Complaint:   OBESITY Brittany Elliott is here to discuss her progress with her obesity treatment plan along with follow-up of her obesity related diagnoses. Brittany Elliott is on the Category 3 Plan and states she is following her eating plan approximately 50% of the time. Brittany Elliott states she is doing circuit training for 40 minutes 4 times per week.  Today's visit was #: 19 Starting weight: 245 lbs Starting date: 03/27/2020 Today's weight: 233 lbs Today's date: 02/15/2021 Total lbs lost to date: 12 Total lbs lost since last in-office visit: 3  Interim History: Brittany Elliott continues to do well with weight loss. She notes dinner time is a struggle.  Subjective:   1. Essential hypertension Brittany Elliott's blood pressure is stable on her medications, and she hasn't been hydrating as well lately.  Assessment/Plan:   1. Essential hypertension Brittany Elliott will continue hydrochlorothiazide and increase her water intake. She will continue to work on diet and exercise to improve blood pressure control. She will watch for signs of hypotension as she continues her lifestyle modifications.  2. Obesity with current BMI 34.4 Brittany Elliott is currently in the action stage of change. As such, her goal is to continue with weight loss efforts. She has agreed to the Category 3 Plan and keeping a food journal and adhering to recommended goals of 400-600 calories and 40+ grams of protein at supper daily.   We discussed various medication options to help Brittany Elliott with her weight loss efforts and we both agreed to increase [name] to 1.8 mg with no refills, and we will refill nano needles #100 with no refills.  - Liraglutide -Weight Management ([name]) 18 MG/3ML SOPN; Inject 1.8 mg into the skin daily.  Dispense: 15 mL; Refill: 0 - Insulin Pen Needle (BD PEN NEEDLE NANO U/F) 32G X 4 MM MISC; Use Nano needle with Ozempic  Dispense: 100 each; Refill: 0  Exercise goals: As is.  Behavioral modification strategies: increasing  lean protein intake and meal planning and cooking strategies.  Brittany Elliott has agreed to follow-up with our clinic in 2 to 3 weeks. She was informed of the importance of frequent follow-up visits to maximize her success with intensive lifestyle modifications for her multiple health conditions.   Objective:   Blood pressure 123/73, pulse 88, temperature 98.1 F (36.7 C), height 5\' 9"  (1.753 m), weight 233 lb (105.7 kg), SpO2 100 %. Body mass index is 34.41 kg/m.  General: Cooperative, alert, well developed, in no acute distress. HEENT: Conjunctivae and lids unremarkable. Cardiovascular: Regular rhythm.  Lungs: Normal work of breathing. Neurologic: No focal deficits.   Lab Results  Component Value Date   CREATININE 0.82 01/11/2021   BUN 12 01/11/2021   NA 139 01/11/2021   K 3.6 01/11/2021   CL 102 01/11/2021   CO2 21 01/11/2021   Lab Results  Component Value Date   ALT 11 01/11/2021   AST 6 01/11/2021   ALKPHOS 37 (L) 01/11/2021   BILITOT 0.6 01/11/2021   Lab Results  Component Value Date   HGBA1C 5.4 01/11/2021   HGBA1C 5.5 03/27/2020   Lab Results  Component Value Date   INSULIN 14.2 01/11/2021   INSULIN 13.2 03/27/2020   Lab Results  Component Value Date   TSH 1.640 03/10/2020   Lab Results  Component Value Date   CHOL 140 03/27/2020   HDL 61 03/27/2020   LDLCALC 65 03/27/2020   TRIG 69 03/27/2020   Lab Results  Component Value Date   VD25OH 80.8 01/11/2021   VD25OH  36.0 03/27/2020   VD25OH 20 (L) 03/31/2012   Lab Results  Component Value Date   WBC 8.4 11/13/2015   HGB 12.0 11/13/2015   HCT 36.4 11/13/2015   MCV 88.3 11/13/2015   PLT 314 11/13/2015   No results found for: IRON, TIBC, FERRITIN  Attestation Statements:   Reviewed by clinician on day of visit: allergies, medications, problem list, medical history, surgical history, family history, social history, and previous encounter notes.  Time spent on visit including pre-visit chart review  and post-visit care and charting was 30 minutes.    I, Burt Knack, am acting as transcriptionist for Quillian Quince, MD.  I have reviewed the above documentation for accuracy and completeness, and I agree with the above. -  Quillian Quince, MD

## 2021-03-08 ENCOUNTER — Other Ambulatory Visit: Payer: Self-pay

## 2021-03-08 ENCOUNTER — Ambulatory Visit (INDEPENDENT_AMBULATORY_CARE_PROVIDER_SITE_OTHER): Payer: BC Managed Care – PPO | Admitting: Family Medicine

## 2021-03-08 ENCOUNTER — Encounter (INDEPENDENT_AMBULATORY_CARE_PROVIDER_SITE_OTHER): Payer: Self-pay | Admitting: Family Medicine

## 2021-03-08 VITALS — BP 103/67 | HR 78 | Temp 98.2°F | Ht 69.0 in | Wt 236.0 lb

## 2021-03-08 DIAGNOSIS — F3289 Other specified depressive episodes: Secondary | ICD-10-CM

## 2021-03-08 DIAGNOSIS — E559 Vitamin D deficiency, unspecified: Secondary | ICD-10-CM | POA: Diagnosis not present

## 2021-03-08 DIAGNOSIS — Z9189 Other specified personal risk factors, not elsewhere classified: Secondary | ICD-10-CM | POA: Diagnosis not present

## 2021-03-08 DIAGNOSIS — Z6836 Body mass index (BMI) 36.0-36.9, adult: Secondary | ICD-10-CM | POA: Diagnosis not present

## 2021-03-08 MED ORDER — VITAMIN D (ERGOCALCIFEROL) 1.25 MG (50000 UNIT) PO CAPS: 50000.0000 [IU] | ORAL_CAPSULE | ORAL | 0 refills | Status: DC

## 2021-03-08 MED ORDER — BUPROPION HCL ER (SR) 150 MG PO TB12: 150.0000 mg | ORAL_TABLET | Freq: Every day | ORAL | 0 refills | Status: DC

## 2021-03-08 NOTE — Progress Notes (Signed)
Chief Complaint:   OBESITY Brittany Elliott is here to discuss her progress with her obesity treatment plan along with follow-up of her obesity related diagnoses. Brittany Elliott is on the Category 3 Plan and keeping a food journal and adhering to recommended goals of 400-600 calories and 40+ grams of protein at supper daily and states she is following her eating plan approximately 40% of the time. Brittany Elliott states she is doing circuit training for 40 minutes 5 times per week.  Today's visit was #: 20 Starting weight: 245 lbs Starting date: 03/27/2020 Today's weight: 236 lbs Today's date: 03/08/2021 Total lbs lost to date: 9 Total lbs lost since last in-office visit: 0  Interim History: Brittany Elliott has been struggling to stay on track. She notes increased temptations at home and at work, and she is skipping some meals.  Subjective:   1. Vitamin D deficiency Brittany Elliott is stable on Vit D, and she denies nausea, vomiting, or muscle weakness.  2. Other depression with emotional eating Brittany Elliott notes increased stress at work and increased comfort eating and cravings.  3. At risk for deficient intake of food Brittany Elliott is at a higher than average risk of deficient intake of food due to skipping meals.  Assessment/Plan:   1. Vitamin D deficiency Low Vitamin D level contributes to fatigue and are associated with obesity, breast, and colon cancer. We will refill prescription Vitamin D 50,000 IU every week for 2 months. We will recheck labs in 2 months. Brittany Elliott will follow-up for routine testing of Vitamin D, at least 2-3 times per year to avoid over-replacement.  - Vitamin D, Ergocalciferol, (DRISDOL) 1.25 MG (50000 UNIT) CAPS capsule; Take 1 capsule (50,000 Units total) by mouth every 7 (seven) days.  Dispense: 8 capsule; Refill: 0  2. Other depression with emotional eating Behavior modification techniques were discussed today to help Brittany Elliott deal with her emotional/non-hunger eating behaviors. Brittany Elliott  agreed to start Wellbutrin SR 150 mg q AM with no refills. Orders and follow up as documented in patient record.   - buPROPion (WELLBUTRIN SR) 150 MG 12 hr tablet; Take 1 tablet (150 mg total) by mouth daily.  Dispense: 30 tablet; Refill: 0  3. At risk for deficient intake of food Brittany Elliott was given approximately 15 minutes of deficit intake of food prevention counseling today. Brittany Elliott is at risk for eating too few calories based on current food recall. She was encouraged to focus on meeting caloric and protein goals according to her recommended meal plan.   4. Obesity with current BMI 34.9 Brittany Elliott is currently in the action stage of change. As such, her goal is to continue with weight loss efforts. She has agreed to the Category 3 Plan and keeping a food journal and adhering to recommended goals of 400-600 calories and 40+ grams of protein at supper daily.   Brittany Elliott will continue [name] at 1.8 mg, and will work on emotional eating behaviors and meal planning. Easy slow cooker recipes were given to help with meal planning were discussed.  Exercise goals: As is.  Behavioral modification strategies: meal planning and cooking strategies and emotional eating strategies.  Brittany Elliott has agreed to follow-up with our clinic in 2 to 3 weeks. She was informed of the importance of frequent follow-up visits to maximize her success with intensive lifestyle modifications for her multiple health conditions.   Objective:   Blood pressure 103/67, pulse 78, temperature 98.2 F (36.8 C), height 5\' 9"  (1.753 m), weight 236 lb (107 kg), SpO2 100 %. Body  mass index is 34.85 kg/m.  General: Cooperative, alert, well developed, in no acute distress. HEENT: Conjunctivae and lids unremarkable. Cardiovascular: Regular rhythm.  Lungs: Normal work of breathing. Neurologic: No focal deficits.   Lab Results  Component Value Date   CREATININE 0.82 01/11/2021   BUN 12 01/11/2021   NA 139 01/11/2021   K 3.6  01/11/2021   CL 102 01/11/2021   CO2 21 01/11/2021   Lab Results  Component Value Date   ALT 11 01/11/2021   AST 6 01/11/2021   ALKPHOS 37 (L) 01/11/2021   BILITOT 0.6 01/11/2021   Lab Results  Component Value Date   HGBA1C 5.4 01/11/2021   HGBA1C 5.5 03/27/2020   Lab Results  Component Value Date   INSULIN 14.2 01/11/2021   INSULIN 13.2 03/27/2020   Lab Results  Component Value Date   TSH 1.640 03/10/2020   Lab Results  Component Value Date   CHOL 140 03/27/2020   HDL 61 03/27/2020   LDLCALC 65 03/27/2020   TRIG 69 03/27/2020   Lab Results  Component Value Date   VD25OH 80.8 01/11/2021   VD25OH 36.0 03/27/2020   VD25OH 20 (L) 03/31/2012   Lab Results  Component Value Date   WBC 8.4 11/13/2015   HGB 12.0 11/13/2015   HCT 36.4 11/13/2015   MCV 88.3 11/13/2015   PLT 314 11/13/2015   No results found for: IRON, TIBC, FERRITIN  Attestation Statements:   Reviewed by clinician on day of visit: allergies, medications, problem list, medical history, surgical history, family history, social history, and previous encounter notes.   I, Burt Knack, am acting as transcriptionist for Quillian Quince, MD.  I have reviewed the above documentation for accuracy and completeness, and I agree with the above. -  Quillian Quince, MD

## 2021-03-12 ENCOUNTER — Other Ambulatory Visit: Payer: Self-pay

## 2021-03-12 ENCOUNTER — Ambulatory Visit: Payer: BC Managed Care – PPO | Attending: Internal Medicine | Admitting: Internal Medicine

## 2021-03-12 ENCOUNTER — Ambulatory Visit: Payer: BC Managed Care – PPO | Admitting: Internal Medicine

## 2021-03-12 DIAGNOSIS — E669 Obesity, unspecified: Secondary | ICD-10-CM | POA: Diagnosis not present

## 2021-03-12 DIAGNOSIS — I10 Essential (primary) hypertension: Secondary | ICD-10-CM

## 2021-03-12 DIAGNOSIS — F4321 Adjustment disorder with depressed mood: Secondary | ICD-10-CM

## 2021-03-12 NOTE — Progress Notes (Signed)
Virtual Visit via Video Note  I connected with Brittany Elliott on 03/12/21 at  8:30 AM EDT by a video enabled telemedicine application and verified that I am speaking with the correct person using two identifiers.  Location: Patient: car Provider: office   I discussed the limitations of evaluation and management by telemedicine and the availability of in person appointments. The patient expressed understanding and agreed to proceed.  History of Present Illness: PT with hx of HTN, obesity.  Last eval 09/2020.  Today's appt is for chronic ds management.  HTN: Patient reports compliance with hydrochlorothiazide.  She reports blood pressure is checked routinely when she goes to medical weight management and her blood pressure has been good.  She tries to limit salt in the foods.  Denies any chest pains, or shortness of breath.  Some lower extremity edema if she does a lot of standing.  In regards to her weight, since last visit with me she was started on [name] by Dr. Dalbert Garnet with medical weight management.  Patient reports that initially it went well.  She has lost 10 pounds since her last visit with me in May.  She has had a lot going on in her personal life and admits that she has not done too well with her eating habits because of it.  She continues to go to the [employer] 4 days a week.  Her boyfriend of 9 years died the beginning of Feb 01, 2023.  She has been dealing with grief since then.  She had looked online about getting in with a grief counselor but has been too busy to pursue it.  She is close with her deceased son and reports good family support from both her family and the deceased family.  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  She does not feel that she needs to be on any medication at this time to help with mood.  Outpatient Encounter Medications as of 03/12/2021  Medication Sig Note   buPROPion  (WELLBUTRIN SR) 150 MG 12 hr tablet Take 1 tablet (150 mg total) by mouth daily.    hydrochlorothiazide (HYDRODIURIL) 25 MG tablet Take 1 tablet (25 mg total) by mouth daily.    Insulin Pen Needle (BD PEN NEEDLE NANO U/F) 32G X 4 MM MISC Use Nano needle with Ozempic    Liraglutide -Weight Management ([name]) 18 MG/3ML SOPN Inject 1.8 mg into the skin daily.    NUVARING 0.12-0.015 MG/24HR vaginal ring Place 1 each vaginally every 21 ( twenty-one) days. 06/26/2018: Patient has regular periods   polyethylene glycol powder (GLYCOLAX/MIRALAX) 17 GM/SCOOP powder Take 17 g by mouth 2 (two) times daily as needed.    Vitamin D, Ergocalciferol, (DRISDOL) 1.25 MG (50000 UNIT) CAPS capsule Take 1 capsule (50,000 Units total) by mouth every 7 (seven) days.    No facility-administered encounter medications on file as of 03/12/2021.        Observations/Objective: Young African-American female in NAD.  Patient was tearful when talking about her recently deceased boyfriend. Reported current wgh is 234 lbs, BMI 34.5  Assessment and Plan: 1. Essential hypertension I confirmed that blood pressure readings have been good on her visits with medical weight management.  She will continue hydrochlorothiazide and low-salt diet.  Recommend wearing a pair of compression socks when she anticipates doing a lot of standing to help decrease edema.  2. Obesity (BMI 30.0-34.9)  Commended her on weight loss so far.  Encouraged her to try to get back on the wagon with healthy eating habits.  3. Grief reaction I empathized with her on the loss of her boyfriend of 9 years.  She feels she would benefit from some counseling.  I have submitted a referral.  I will have our LCSW touch base with her also. - Ambulatory referral to Psychiatry   Follow Up Instructions: 6 mths   I discussed the assessment and treatment plan with the patient. The patient was provided an opportunity to ask questions and all were answered. The patient  agreed with the plan and demonstrated an understanding of the instructions.   The patient was advised to call back or seek an in-person evaluation if the symptoms worsen or if the condition fails to improve as anticipated.  I spent 12 minutes dedicated to the care of this patient on the date of this encounter to include previsit review of of my last note and some of the notes from medical weight management, face-to-face time with the patient and postvisit entering of orders.  Jonah Blue, MD

## 2021-03-23 ENCOUNTER — Encounter (INDEPENDENT_AMBULATORY_CARE_PROVIDER_SITE_OTHER): Payer: Self-pay | Admitting: Family Medicine

## 2021-03-26 NOTE — Telephone Encounter (Signed)
Please see message . Thank you .

## 2021-03-27 ENCOUNTER — Ambulatory Visit (INDEPENDENT_AMBULATORY_CARE_PROVIDER_SITE_OTHER): Payer: BC Managed Care – PPO | Admitting: Family Medicine

## 2021-03-30 ENCOUNTER — Other Ambulatory Visit (INDEPENDENT_AMBULATORY_CARE_PROVIDER_SITE_OTHER): Payer: Self-pay | Admitting: Family Medicine

## 2021-03-30 DIAGNOSIS — F3289 Other specified depressive episodes: Secondary | ICD-10-CM

## 2021-04-02 NOTE — Telephone Encounter (Signed)
LAST APPOINTMENT DATE: 03/08/21 NEXT APPOINTMENT DATE: 04/09/21   [id number] - [place], [place] - [address] AT [facility] [address]  [place] [address] Phone: [phone] Fax: [phone]  Patient is requesting a refill of the following medications: Requested Prescriptions   Pending Prescriptions Disp Refills   buPROPion (WELLBUTRIN SR) 150 MG 12 hr tablet [Pharmacy Med Name: BUPROPION HCL SR 150 MG TABLET] 90 tablet 1    Sig: TAKE 1 TABLET BY MOUTH EVERY DAY    Date last filled: 03/08/21 Previously prescribed by Dr. Dalbert Garnet  Lab Results  Component Value Date   HGBA1C 5.4 01/11/2021   HGBA1C 5.5 03/27/2020   Lab Results  Component Value Date   LDLCALC 65 03/27/2020   CREATININE 0.82 01/11/2021   Lab Results  Component Value Date   VD25OH 80.8 01/11/2021   VD25OH 36.0 03/27/2020   VD25OH 20 (L) 03/31/2012    BP Readings from Last 3 Encounters:  03/08/21 103/67  02/15/21 123/73  01/25/21 111/77

## 2021-04-04 ENCOUNTER — Other Ambulatory Visit (INDEPENDENT_AMBULATORY_CARE_PROVIDER_SITE_OTHER): Payer: Self-pay | Admitting: Family Medicine

## 2021-04-04 NOTE — Telephone Encounter (Signed)
LAST APPOINTMENT DATE: 03/08/21 NEXT APPOINTMENT DATE: 04/09/21   [id number] - [place], [place] - [address] AT [facility] OF [address] [place] [address] Phone: [phone] Fax: [phone]  Patient is requesting a refill of the following medications: Requested Prescriptions   Pending Prescriptions Disp Refills   [name] 18 MG/3ML SOPN [Pharmacy Med Name: [name] 18 MG/3 ML PEN]      Sig: INJECT 1.8 MG UNDER THE SKIN ONCE DAILY    Date last filled: 02/15/21 Previously prescribed by Dr. Dalbert Garnet  Lab Results  Component Value Date   HGBA1C 5.4 01/11/2021   HGBA1C 5.5 03/27/2020   Lab Results  Component Value Date   LDLCALC 65 03/27/2020   CREATININE 0.82 01/11/2021   Lab Results  Component Value Date   VD25OH 80.8 01/11/2021   VD25OH 36.0 03/27/2020   VD25OH 20 (L) 03/31/2012    BP Readings from Last 3 Encounters:  03/08/21 103/67  02/15/21 123/73  01/25/21 111/77

## 2021-04-04 NOTE — Telephone Encounter (Signed)
Pt last seen by Dr. Beasley.  

## 2021-04-09 ENCOUNTER — Ambulatory Visit (INDEPENDENT_AMBULATORY_CARE_PROVIDER_SITE_OTHER): Payer: BC Managed Care – PPO | Admitting: Family Medicine

## 2021-04-09 ENCOUNTER — Other Ambulatory Visit: Payer: Self-pay

## 2021-04-09 ENCOUNTER — Encounter (INDEPENDENT_AMBULATORY_CARE_PROVIDER_SITE_OTHER): Payer: Self-pay | Admitting: Family Medicine

## 2021-04-09 VITALS — BP 113/73 | HR 95 | Temp 98.6°F | Ht 69.0 in | Wt 228.0 lb

## 2021-04-09 DIAGNOSIS — E559 Vitamin D deficiency, unspecified: Secondary | ICD-10-CM

## 2021-04-09 DIAGNOSIS — Z6836 Body mass index (BMI) 36.0-36.9, adult: Secondary | ICD-10-CM

## 2021-04-09 DIAGNOSIS — Z9189 Other specified personal risk factors, not elsewhere classified: Secondary | ICD-10-CM

## 2021-04-09 DIAGNOSIS — F3289 Other specified depressive episodes: Secondary | ICD-10-CM

## 2021-04-09 DIAGNOSIS — E66812 Obesity, class 2: Secondary | ICD-10-CM

## 2021-04-09 MED ORDER — BUPROPION HCL ER (SR) 150 MG PO TB12: 150.0000 mg | ORAL_TABLET | Freq: Every day | ORAL | 0 refills | Status: DC

## 2021-04-09 MED ORDER — VITAMIN D (ERGOCALCIFEROL) 1.25 MG (50000 UNIT) PO CAPS: 50000.0000 [IU] | ORAL_CAPSULE | ORAL | 0 refills | Status: DC

## 2021-04-09 NOTE — Progress Notes (Signed)
Chief Complaint:   OBESITY Brittany Elliott is here to discuss her progress with her obesity treatment plan along with follow-up of her obesity related diagnoses. Brittany Elliott is on the Category 3 Plan and keeping a food journal and adhering to recommended goals of 400-600 calories and 40+ grams of protein at supper daily and states she is following her eating plan approximately 40% of the time. Brittany Elliott states she is doing 0 minutes 0 times per week.  Today's visit was #: 21 Starting weight: 245 lbs Starting date: 03/27/2020 Today's weight: 228 lbs Today's date: 04/09/2021 Total lbs lost to date: 17 Total lbs lost since last in-office visit: 8  Interim History: Brittany Elliott has done well with weight loss. About half of her weight loss is due to decreased water weight. She hasn't been following her plan closely and she has not been getting in her lean protein.  Subjective:   1. Vitamin D deficiency Brittany Elliott is stable on Vit D, and she has no signs of over-replacement. She is due for labs soon.  2. Other depression with emotional eating Brittany Elliott started bupropion and she noticed more dry scalp and she was worried after Googling side effects so she stopped. She isn't sure if she wants to restart although she did better with weight loss than expected.  3. At risk for impaired metabolic function Brittany Elliott is at increased risk for impaired metabolic function if calories or protein decreases.  Assessment/Plan:   1. Vitamin D deficiency Low Vitamin D level contributes to fatigue and are associated with obesity, breast, and colon cancer. We will refill prescription Vitamin D for 1 month, and we will recheck labs at her next visit. Brittany Elliott will follow-up for routine testing of Vitamin D, at least 2-3 times per year to avoid over-replacement.  - Vitamin D, Ergocalciferol, (DRISDOL) 1.25 MG (50000 UNIT) CAPS capsule; Take 1 capsule (50,000 Units total) by mouth every 7 (seven) days.  Dispense: 4 capsule;  Refill: 0  2. Other depression with emotional eating Behavior modification techniques were discussed today to help [facility] deal with her emotional/non-hunger eating behaviors. We will refill Wellbutrin SR for 1 month. Brittany Elliott is to let me know if she decides to continue or discontinue at her next visit. Orders and follow up as documented in patient record.   - buPROPion (WELLBUTRIN SR) 150 MG 12 hr tablet; Take 1 tablet (150 mg total) by mouth daily.  Dispense: 30 tablet; Refill: 0  3. At risk for impaired metabolic function Brittany Elliott was given approximately 15 minutes of impaired  metabolic function prevention counseling today. We discussed intensive lifestyle modifications today with an emphasis on specific nutrition and exercise instructions and strategies.   Repetitive spaced learning was employed today to elicit superior memory formation and behavioral change.  4. Obesity with current BMI 33.7 Brittany Elliott is currently in the action stage of change. As such, her goal is to continue with weight loss efforts. She has agreed to the Category 3 Plan.   We will recheck fasting labs at her next visit.  We discussed various medication options to help Brittany Elliott with her weight loss efforts and we both agreed to continue [name], Brittany Elliott is ok to decrease her dose to 1.2 mg if it helps her eat all of her food on the plan.  Behavioral modification strategies: increasing lean protein intake and holiday eating strategies .  Brittany Elliott has agreed to follow-up with our clinic in 3 to 4 weeks. She was informed of the importance of frequent follow-up  visits to maximize her success with intensive lifestyle modifications for her multiple health conditions.   Objective:   Blood pressure 113/73, pulse 95, temperature 98.6 F (37 C), height 5\' 9"  (1.753 m), weight 228 lb (103.4 kg), SpO2 98 %. Body mass index is 33.67 kg/m.  General: Cooperative, alert, well developed, in no acute distress. HEENT:  Conjunctivae and lids unremarkable. Cardiovascular: Regular rhythm.  Lungs: Normal work of breathing. Neurologic: No focal deficits.   Lab Results  Component Value Date   CREATININE 0.82 01/11/2021   BUN 12 01/11/2021   NA 139 01/11/2021   K 3.6 01/11/2021   CL 102 01/11/2021   CO2 21 01/11/2021   Lab Results  Component Value Date   ALT 11 01/11/2021   AST 6 01/11/2021   ALKPHOS 37 (L) 01/11/2021   BILITOT 0.6 01/11/2021   Lab Results  Component Value Date   HGBA1C 5.4 01/11/2021   HGBA1C 5.5 03/27/2020   Lab Results  Component Value Date   INSULIN 14.2 01/11/2021   INSULIN 13.2 03/27/2020   Lab Results  Component Value Date   TSH 1.640 03/10/2020   Lab Results  Component Value Date   CHOL 140 03/27/2020   HDL 61 03/27/2020   LDLCALC 65 03/27/2020   TRIG 69 03/27/2020   Lab Results  Component Value Date   VD25OH 80.8 01/11/2021   VD25OH 36.0 03/27/2020   VD25OH 20 (L) 03/31/2012   Lab Results  Component Value Date   WBC 8.4 11/13/2015   HGB 12.0 11/13/2015   HCT 36.4 11/13/2015   MCV 88.3 11/13/2015   PLT 314 11/13/2015   No results found for: IRON, TIBC, FERRITIN  Attestation Statements:   Reviewed by clinician on day of visit: allergies, medications, problem list, medical history, surgical history, family history, social history, and previous encounter notes.   I, 11/15/2015, am acting as transcriptionist for Burt Knack, MD.  I have reviewed the above documentation for accuracy and completeness, and I agree with the above. -  Quillian Quince, MD

## 2021-04-18 ENCOUNTER — Institutional Professional Consult (permissible substitution): Payer: BC Managed Care – PPO | Admitting: Clinical

## 2021-04-18 ENCOUNTER — Telehealth: Payer: Self-pay | Admitting: Clinical

## 2021-04-18 ENCOUNTER — Ambulatory Visit (HOSPITAL_BASED_OUTPATIENT_CLINIC_OR_DEPARTMENT_OTHER): Payer: BC Managed Care – PPO | Admitting: Clinical

## 2021-04-18 DIAGNOSIS — F4321 Adjustment disorder with depressed mood: Secondary | ICD-10-CM | POA: Diagnosis not present

## 2021-04-18 NOTE — BH Specialist Note (Signed)
Integrated Behavioral Health via Telemedicine Visit  04/18/2021 Brittany Elliott [id number]  Number of Integrated Behavioral Health visits: 1 Session Start time: 1:35pm  Session End time: 2:20pm Total time: 45   Referring Provider: Jonah Blue, MD Patient/Family location: Vehicle [facility] Provider location: [facility] All persons participating in visit: Pt and LCSWA Types of Service: Individual psychotherapy  I connected with Brittany Elliott via Video Enabled Telemedicine Application  (Video is Caregility application) and verified that I am speaking with the correct person using two identifiers. Discussed confidentiality: Yes   I discussed the limitations of telemedicine and the availability of in person appointments.  Discussed there is a possibility of technology failure and discussed alternative modes of communication if that failure occurs.  I discussed that engaging in this telemedicine visit, they consent to the provision of behavioral healthcare and the services will be billed under their insurance.  Patient and/or legal guardian expressed understanding and consented to Telemedicine visit: Yes   Presenting Concerns: Patient and/or family reports the following symptoms/concerns: Pt reports experiencing grief. Reports that her boyfriend was killed in August.  Duration of problem: 3 months; Severity of problem: moderate  Patient and/or Family's Strengths/Protective Factors: Social and Emotional competence and Concrete supports in place (healthy food, safe environments, etc.)  Goals Addressed: Patient will:  Reduce symptoms of: depression   Increase knowledge and/or ability of: coping skills   Demonstrate ability to: Begin healthy grieving over loss  Progress towards Goals: Ongoing  Interventions: Interventions utilized:  CBT Cognitive Behavioral Therapy and Supportive Counseling Standardized Assessments completed: GAD-7 and PHQ 9 GAD 7 : Generalized Anxiety Score  04/18/2021 10/09/2020 06/26/2018 05/08/2018  Nervous, Anxious, on Edge 0 0 0 0  Control/stop worrying 0 0 0 0  Worry too much - different things 0 0 0 0  Trouble relaxing 0 0 0 0  Restless 0 0 0 0  Easily annoyed or irritable 0 0 0 0  Afraid - awful might happen 0 0 0 0  Total GAD 7 Score 0 0 0 0     Depression screen [facility] 2/9 04/18/2021 10/09/2020 03/27/2020 03/10/2020 06/26/2018  Decreased Interest 0 0 0 0 0  Down, Depressed, Hopeless 0 0 0 0 0  PHQ - 2 Score 0 0 0 0 0  Altered sleeping 0 - 0 - -  Tired, decreased energy 0 - 1 - -  Change in appetite 0 - 2 - -  Feeling bad or failure about yourself  0 - 0 - -  Trouble concentrating 0 - 0 - -  Moving slowly or fidgety/restless 0 - 0 - -  Suicidal thoughts 0 - 0 - -  PHQ-9 Score 0 - 3 - -  Difficult doing work/chores - - Not difficult at all - -    Patient and/or Family Response: Pt receptive to tx. Pt receptive to psychoeducation provided on grief. Pt will utilize grief journalling.  Assessment: Denies SI/HI. Denies auditory/visual hallucinations. No safety risks. Patient currently experiencing grief reaction. Pt is experiencing bargaining currently within the stage of grief due to having several questions about her boyfriends death. Pt was referred to [facility] for psychiatry but does not want medication. Pt reportedly stopped taking wellbutrin which was prescribed from a different practice.  Patient may benefit from brief therapy to assist with grief. LCSWA provided psycheoducation on the stages of grief. LCSWA encouraged pt to utilize grief journalling. LCSWA will fu with pt.  Plan: Follow up with behavioral health clinician on : 05/09/21  Behavioral recommendations: Utilize grief journalling Referral(s): Integrated [employer] (In Clinic)  I discussed the assessment and treatment plan with the patient and/or parent/guardian. They were provided an opportunity to ask questions and all were answered. They agreed  with the plan and demonstrated an understanding of the instructions.   They were advised to call back or seek an in-person evaluation if the symptoms worsen or if the condition fails to improve as anticipated.  Kingstyn Deruiter C Shataria Crist, LCSW

## 2021-04-30 NOTE — Telephone Encounter (Signed)
Pt's appt has been scheduled.  °

## 2021-05-05 ENCOUNTER — Other Ambulatory Visit (INDEPENDENT_AMBULATORY_CARE_PROVIDER_SITE_OTHER): Payer: Self-pay | Admitting: Family Medicine

## 2021-05-05 DIAGNOSIS — F3289 Other specified depressive episodes: Secondary | ICD-10-CM

## 2021-05-07 ENCOUNTER — Encounter (INDEPENDENT_AMBULATORY_CARE_PROVIDER_SITE_OTHER): Payer: Self-pay | Admitting: Family Medicine

## 2021-05-07 ENCOUNTER — Other Ambulatory Visit: Payer: Self-pay

## 2021-05-07 ENCOUNTER — Ambulatory Visit (INDEPENDENT_AMBULATORY_CARE_PROVIDER_SITE_OTHER): Payer: BC Managed Care – PPO | Admitting: Family Medicine

## 2021-05-07 VITALS — BP 111/74 | HR 83 | Temp 98.5°F | Ht 69.0 in | Wt 231.0 lb

## 2021-05-07 DIAGNOSIS — E8881 Metabolic syndrome: Secondary | ICD-10-CM | POA: Diagnosis not present

## 2021-05-07 DIAGNOSIS — F3289 Other specified depressive episodes: Secondary | ICD-10-CM | POA: Diagnosis not present

## 2021-05-07 DIAGNOSIS — Z9189 Other specified personal risk factors, not elsewhere classified: Secondary | ICD-10-CM | POA: Diagnosis not present

## 2021-05-07 DIAGNOSIS — Z6836 Body mass index (BMI) 36.0-36.9, adult: Secondary | ICD-10-CM

## 2021-05-07 DIAGNOSIS — E559 Vitamin D deficiency, unspecified: Secondary | ICD-10-CM

## 2021-05-07 MED ORDER — SAXENDA 18 MG/3ML ~~LOC~~ SOPN: PEN_INJECTOR | SUBCUTANEOUS | 0 refills | Status: DC

## 2021-05-07 NOTE — Progress Notes (Signed)
   Chief Complaint:   OBESITY Brittany Elliott is here to discuss her progress with her obesity treatment plan along with follow-up of her obesity related diagnoses. Brittany Elliott is on the Category 3 Plan and states she is following her eating plan approximately 50% of the time. Brittany Elliott states she is doing circuit training for 30 minutes 4 times per week.  Today's visit was #: 22 Starting weight: 245 lbs Starting date: 03/27/2020 Today's weight: 231 lbs Today's date: 05/07/2021 Total lbs lost to date: 14 Total lbs lost since last in-office visit: 0  Interim History: Brittany Elliott is retaining some fluid today. She has actually done well with minimizing holiday weight gain. She is doing well on [name], but she is concerned about Christmas weight gain.  Subjective:   1. Insulin resistance Brittany Elliott is working on diet, exercise, and weight loss. She started on her GLP-1 and she is due for labs.  2. Vitamin D deficiency Brittany Elliott is on Vit D, and her last lab was at goal. She is at high risk of over-replacement.  3. Other depression with emotional eating Brittany Elliott was on Wellbutrin, but she felt it was causing her skin to be especially dry so she stopped. She continue to work on decreasing emotional eating behaviors.  4. At risk for heart disease Brittany Elliott is at a higher than average risk for cardiovascular disease due to obesity.   Assessment/Plan:   1. Insulin resistance Brittany Elliott will continue her GLP-1, diet, exercise, and weight loss. We will check labs today. Rakeb agreed to follow-up with us as directed to closely monitor her progress.  - CMP14+EGFR - Lipid Panel With LDL/HDL Ratio - Insulin, random - Hemoglobin A1c  2. Vitamin D deficiency Low Vitamin D level contributes to fatigue and are associated with obesity, breast, and colon cancer. We will check labs today, and Brittany Elliott will follow-up for routine testing of Vitamin D, at least 2-3 times per year to avoid over-replacement.  -  VITAMIN D 25 Hydroxy (Vit-D Deficiency, Fractures)  3. Other depression with emotional eating Brittany Elliott agreed to discontinue Wellbutrin, we will follow up at her next visit. Emotional eating behaviors were discussed today to help Brittany Elliott deal with her emotional/non-hunger eating behaviors. Orders and follow up as documented in patient record.   4. At risk for heart disease Brittany Elliott was given approximately 15 minutes of coronary artery disease prevention counseling today. She is 35 y.o. female and has risk factors for heart disease including obesity. We discussed intensive lifestyle modifications today with an emphasis on specific weight loss instructions and strategies.   Repetitive spaced learning was employed today to elicit superior memory formation and behavioral change.  5. Obesity with current BMI 34.4 Brittany Elliott is currently in the action stage of change. As such, her goal is to continue with weight loss efforts. She has agreed to the Category 3 Plan.   We discussed various medication options to help Brittany Elliott with her weight loss efforts and we both agreed to continue [name] and we will refill for 1 month.  - Liraglutide -Weight Management ([name]) 18 MG/3ML SOPN; INJECT 1.8 MG UNDER THE SKIN ONCE DAILY Strength: 18 MG/3ML  Dispense: 15 mL; Refill: 0  Exercise goals: As is.  Behavioral modification strategies: increasing lean protein intake, ways to avoid boredom eating, holiday eating strategies , and celebration eating strategies.  Brittany Elliott has agreed to follow-up with our clinic in 3 to 4 weeks. She was informed of the importance of frequent follow-up visits to maximize her success with intensive lifestyle   modifications for her multiple health conditions.   Brittany Elliott was informed we would discuss her lab results at her next visit unless there is a critical issue that needs to be addressed sooner. Brittany Elliott agreed to keep her next visit at the agreed upon time to discuss these  results.  Objective:   Blood pressure 111/74, pulse 83, temperature 98.5 F (36.9 C), height 5' 9" (1.753 m), weight 231 lb (104.8 kg), SpO2 99 %. Body mass index is 34.11 kg/m.  General: Cooperative, alert, well developed, in no acute distress. HEENT: Conjunctivae and lids unremarkable. Cardiovascular: Regular rhythm.  Lungs: Normal work of breathing. Neurologic: No focal deficits.   Lab Results  Component Value Date   CREATININE 0.82 01/11/2021   BUN 12 01/11/2021   NA 139 01/11/2021   K 3.6 01/11/2021   CL 102 01/11/2021   CO2 21 01/11/2021   Lab Results  Component Value Date   ALT 11 01/11/2021   AST 6 01/11/2021   ALKPHOS 37 (L) 01/11/2021   BILITOT 0.6 01/11/2021   Lab Results  Component Value Date   HGBA1C 5.4 01/11/2021   HGBA1C 5.5 03/27/2020   Lab Results  Component Value Date   INSULIN 14.2 01/11/2021   INSULIN 13.2 03/27/2020   Lab Results  Component Value Date   TSH 1.640 03/10/2020   Lab Results  Component Value Date   CHOL 140 03/27/2020   HDL 61 03/27/2020   LDLCALC 65 03/27/2020   TRIG 69 03/27/2020   Lab Results  Component Value Date   VD25OH 80.8 01/11/2021   VD25OH 36.0 03/27/2020   VD25OH 20 (L) 03/31/2012   Lab Results  Component Value Date   WBC 8.4 11/13/2015   HGB 12.0 11/13/2015   HCT 36.4 11/13/2015   MCV 88.3 11/13/2015   PLT 314 11/13/2015   No results found for: IRON, TIBC, FERRITIN  Attestation Statements:   Reviewed by clinician on day of visit: allergies, medications, problem list, medical history, surgical history, family history, social history, and previous encounter notes.   I, Sharon Martin, am acting as transcriptionist for Caren Beasley, MD.  I have reviewed the above documentation for accuracy and completeness, and I agree with the above. -  Caren Beasley, MD   

## 2021-05-08 LAB — HEMOGLOBIN A1C
Est. average glucose Bld gHb Est-mCnc: 108 mg/dL
Hgb A1c MFr Bld: 5.4 % (ref 4.8–5.6)

## 2021-05-08 LAB — CMP14+EGFR
ALT: 11 IU/L (ref 0–32)
AST: 7 IU/L (ref 0–40)
Albumin/Globulin Ratio: 1.5 (ref 1.2–2.2)
Albumin: 4 g/dL (ref 3.8–4.8)
Alkaline Phosphatase: 44 IU/L (ref 44–121)
BUN/Creatinine Ratio: 17 (ref 9–23)
BUN: 13 mg/dL (ref 6–20)
Bilirubin Total: 0.4 mg/dL (ref 0.0–1.2)
CO2: 25 mmol/L (ref 20–29)
Calcium: 9.5 mg/dL (ref 8.7–10.2)
Chloride: 105 mmol/L (ref 96–106)
Creatinine, Ser: 0.77 mg/dL (ref 0.57–1.00)
Globulin, Total: 2.7 g/dL (ref 1.5–4.5)
Glucose: 89 mg/dL (ref 70–99)
Potassium: 3.9 mmol/L (ref 3.5–5.2)
Sodium: 142 mmol/L (ref 134–144)
Total Protein: 6.7 g/dL (ref 6.0–8.5)
eGFR: 104 mL/min/{1.73_m2} (ref >59)

## 2021-05-08 LAB — LIPID PANEL WITH LDL/HDL RATIO
Cholesterol, Total: 141 mg/dL (ref 100–199)
HDL: 58 mg/dL (ref >39)
LDL Chol Calc (NIH): 72 mg/dL (ref 0–99)
LDL/HDL Ratio: 1.2 ratio (ref 0.0–3.2)
Triglycerides: 47 mg/dL (ref 0–149)
VLDL Cholesterol Cal: 11 mg/dL (ref 5–40)

## 2021-05-08 LAB — VITAMIN D 25 HYDROXY (VIT D DEFICIENCY, FRACTURES): Vit D, 25-Hydroxy: 86 ng/mL (ref 30.0–100.0)

## 2021-05-08 LAB — INSULIN, RANDOM: INSULIN: 18.8 u[IU]/mL (ref 2.6–24.9)

## 2021-05-09 ENCOUNTER — Ambulatory Visit (HOSPITAL_BASED_OUTPATIENT_CLINIC_OR_DEPARTMENT_OTHER): Payer: BC Managed Care – PPO | Admitting: Clinical

## 2021-05-09 DIAGNOSIS — F4321 Adjustment disorder with depressed mood: Secondary | ICD-10-CM | POA: Diagnosis not present

## 2021-05-10 ENCOUNTER — Other Ambulatory Visit (INDEPENDENT_AMBULATORY_CARE_PROVIDER_SITE_OTHER): Payer: Self-pay | Admitting: Family Medicine

## 2021-05-10 DIAGNOSIS — Z6836 Body mass index (BMI) 36.0-36.9, adult: Secondary | ICD-10-CM

## 2021-05-14 NOTE — Telephone Encounter (Signed)
Last OV with Dr. Beasley 

## 2021-05-18 NOTE — BH Specialist Note (Signed)
Integrated Behavioral Health via Telemedicine Visit    05/09/2021 Clarie Camey Tomkinson [id number]  Number of Integrated Behavioral Health visits: 2  Session Start time: 1:30pm  Session End time: 2:15pm Total time: 45   Referring Provider: Jonah Blue, MD Patient/Family location: [facility] Provider location: [facility] All persons participating in visit: Pt and LCSWA Types of Service: Individual psychotherapy and Video visit  I connected with Vergie Living Fata via Video Enabled Telemedicine Application  (Video is Caregility application) and verified that I am speaking with the correct person using two identifiers. Discussed confidentiality: Yes   I discussed the limitations of telemedicine and the availability of in person appointments.  Discussed there is a possibility of technology failure and discussed alternative modes of communication if that failure occurs.  I discussed that engaging in this telemedicine visit, they consent to the provision of behavioral healthcare and the services will be billed under their insurance.  Patient and/or legal guardian expressed understanding and consented to Telemedicine visit: Yes   Presenting Concerns: Patient and/or family reports the following symptoms/concerns: Pt reports experiencing grief related to her boyfriend's death. Reports that she misses being able to talk to her boyfriend as he was the primary person when would turn to when she needed to speak. Duration of problem: 4 months ; Severity of problem: moderate  Patient and/or Family's Strengths/Protective Factors: Social and Emotional competence and Concrete supports in place (healthy food, safe environments, etc.)  Goals Addressed: Patient will:  Reduce symptoms of: depression   Increase knowledge and/or ability of: coping skills   Demonstrate ability to: Begin healthy grieving over loss  Progress towards Goals: Ongoing  Interventions: Interventions utilized:  CBT Cognitive  Behavioral Therapy and Supportive Counseling Standardized Assessments completed: Not Needed  Patient and/or Family Response: Pt receptive to tx. Pt receptive to psychoeducation on grief. Pt receptive to utilizing a thought record to process grief. Pt receptive to cognitive restructuring to decrease negative and unhelpful thoughts.   Assessment: Denies SI/HI. Denies auditory/visual hallucinations. Patient currently experiencing depression and anxiety. Pt continues to have difficulty with grief. Pt misses being able to talk and receive advice from her boyfriend. Pt discussed additionally what she missed about her boyfriend.    Patient may benefit from continuing to engage in an automatic thought record. LCSWA provided psychoeducation on grief. LCSWA utilized cognitive restructuring to decrease negative and unhelpful thoughts. LCSWA encouraged pt to utilize grief journaling. LCSWA will fu with a pt.  Plan: Follow up with behavioral health clinician on : 06/04/21 Behavioral recommendations: Utilize grief journaling. Referral(s): Integrated [employer] (In Clinic)  I discussed the assessment and treatment plan with the patient and/or parent/guardian. They were provided an opportunity to ask questions and all were answered. They agreed with the plan and demonstrated an understanding of the instructions.   They were advised to call back or seek an in-person evaluation if the symptoms worsen or if the condition fails to improve as anticipated.  Amia Rynders C Trooper Olander, LCSW

## 2021-05-29 ENCOUNTER — Encounter (INDEPENDENT_AMBULATORY_CARE_PROVIDER_SITE_OTHER): Payer: Self-pay | Admitting: Physician Assistant

## 2021-05-29 ENCOUNTER — Other Ambulatory Visit: Payer: Self-pay

## 2021-05-29 ENCOUNTER — Ambulatory Visit (INDEPENDENT_AMBULATORY_CARE_PROVIDER_SITE_OTHER): Payer: BC Managed Care – PPO | Admitting: Physician Assistant

## 2021-05-29 VITALS — BP 114/71 | HR 74 | Temp 98.4°F | Ht 69.0 in | Wt 242.0 lb

## 2021-05-29 DIAGNOSIS — E559 Vitamin D deficiency, unspecified: Secondary | ICD-10-CM | POA: Diagnosis not present

## 2021-05-29 DIAGNOSIS — Z6836 Body mass index (BMI) 36.0-36.9, adult: Secondary | ICD-10-CM

## 2021-05-29 DIAGNOSIS — Z9189 Other specified personal risk factors, not elsewhere classified: Secondary | ICD-10-CM | POA: Diagnosis not present

## 2021-05-29 MED ORDER — LIRAGLUTIDE 18 MG/3ML ~~LOC~~ SOPN: 2.4000 mg | PEN_INJECTOR | Freq: Every day | SUBCUTANEOUS | 0 refills | Status: DC

## 2021-05-29 MED ORDER — VITAMIN D3 50 MCG (2000 UT) PO CAPS
2000.0000 [IU] | ORAL_CAPSULE | Freq: Every day | ORAL | 0 refills | Status: DC
Start: 2021-05-29 — End: 2021-08-07

## 2021-05-29 NOTE — Progress Notes (Signed)
Chief Complaint:   OBESITY Brittany Elliott is here to discuss her progress with her obesity treatment plan along with follow-up of her obesity related diagnoses. Brittany Elliott is on the Category 3 Plan and states she is following her eating plan approximately 30% of the time. Brittany Elliott states she is not currently exercising.  Today's visit was #: 23 Starting weight: 245 lbs Starting date: 03/27/2020 Today's weight: 242 lbs Today's date: 05/29/2021 Total lbs lost to date: 3 Total lbs lost since last in-office visit: 0  Interim History: She reports that she overindulged over the holiday. She is a Pharmacist, hospital and being out of routine made it difficult for her to follow the plan and remember to take Colbert everyday. She notes sometimes skipping lunch when she's at school.  Subjective:   1. Vitamin D deficiency Pt is on Rx Vit D every other week. Her last level was ar goal and she is at risk for over supplementation.   2. At risk for impaired metabolic function Brittany Elliott is at increased risk for impaired metabolic function due to skipping meals.  Assessment/Plan:   1. Vitamin D deficiency Low Vitamin D level contributes to fatigue and are associated with obesity, breast, and colon cancer. She agrees to discontinue prescription Vitamin D 50,000 IU and start OTC Vit D 2,000 IU daily. She will follow-up for routine testing of Vitamin D, at least 2-3 times per year to avoid over-replacement.  Change to- Cholecalciferol (VITAMIN D3) 50 MCG (2000 UT) capsule; Take 1 capsule (2,000 Units total) by mouth daily.  Dispense: 30 capsule; Refill: 0  2. At risk for impaired metabolic function Brittany Elliott was given approximately 15 minutes of impaired  metabolic function prevention counseling today. We discussed intensive lifestyle modifications today with an emphasis on specific nutrition and exercise instructions and strategies.   Repetitive spaced learning was employed today to elicit superior memory formation and  behavioral change.  3. Obesity with current BMI 35.72  Brittany Elliott is currently in the action stage of change. As such, her goal is to continue with weight loss efforts. She has agreed to the Category 3 Plan.   We discussed various medication options to help [facility] with her weight loss efforts and we both agreed to increase [name] to 2.4 mg as directed. - liraglutide (VICTOZA) 18 MG/3ML SOPN; Inject 2.4 mg into the skin daily.  Dispense: 12 mL; Refill: 0  Exercise goals:  As is  Behavioral modification strategies: meal planning and cooking strategies and planning for success.  Brittany Elliott has agreed to follow-up with our clinic in 3 weeks. She was informed of the importance of frequent follow-up visits to maximize her success with intensive lifestyle modifications for her multiple health conditions.   Objective:   Blood pressure 114/71, pulse 74, temperature 98.4 F (36.9 C), height 5\' 9"  (1.753 m), weight 242 lb (109.8 kg), SpO2 99 %. Body mass index is 35.74 kg/m.  General: Cooperative, alert, well developed, in no acute distress. HEENT: Conjunctivae and lids unremarkable. Cardiovascular: Regular rhythm.  Lungs: Normal work of breathing. Neurologic: No focal deficits.   Lab Results  Component Value Date   CREATININE 0.77 05/07/2021   BUN 13 05/07/2021   NA 142 05/07/2021   K 3.9 05/07/2021   CL 105 05/07/2021   CO2 25 05/07/2021   Lab Results  Component Value Date   ALT 11 05/07/2021   AST 7 05/07/2021   ALKPHOS 44 05/07/2021   BILITOT 0.4 05/07/2021   Lab Results  Component Value Date  HGBA1C 5.4 05/07/2021   HGBA1C 5.4 01/11/2021   HGBA1C 5.5 03/27/2020   Lab Results  Component Value Date   INSULIN 18.8 05/07/2021   INSULIN 14.2 01/11/2021   INSULIN 13.2 03/27/2020   Lab Results  Component Value Date   TSH 1.640 03/10/2020   Lab Results  Component Value Date   CHOL 141 05/07/2021   HDL 58 05/07/2021   LDLCALC 72 05/07/2021   TRIG 47 05/07/2021    Lab Results  Component Value Date   VD25OH 86.0 05/07/2021   VD25OH 80.8 01/11/2021   VD25OH 36.0 03/27/2020   Lab Results  Component Value Date   WBC 8.4 11/13/2015   HGB 12.0 11/13/2015   HCT 36.4 11/13/2015   MCV 88.3 11/13/2015   PLT 314 11/13/2015    Attestation Statements:   Reviewed by clinician on day of visit: allergies, medications, problem list, medical history, surgical history, family history, social history, and previous encounter notes.  Coral Ceo, CMA, am acting as transcriptionist for [employer], PA-C.  I have reviewed the above documentation for accuracy and completeness, and I agree with the above. Abby Potash, PA-C

## 2021-05-31 ENCOUNTER — Other Ambulatory Visit (INDEPENDENT_AMBULATORY_CARE_PROVIDER_SITE_OTHER): Payer: Self-pay

## 2021-05-31 MED ORDER — LIRAGLUTIDE 18 MG/3ML ~~LOC~~ SOPN: 1.8000 mg | PEN_INJECTOR | Freq: Every day | SUBCUTANEOUS | 0 refills | Status: DC

## 2021-05-31 NOTE — Telephone Encounter (Signed)
Pharmacy asking for clarification of Vitcoza dose.  What has been sent is a dose for 2.4 mg,  please clarify.  Thank you.

## 2021-06-04 ENCOUNTER — Ambulatory Visit (HOSPITAL_BASED_OUTPATIENT_CLINIC_OR_DEPARTMENT_OTHER): Payer: BC Managed Care – PPO | Admitting: Clinical

## 2021-06-04 ENCOUNTER — Other Ambulatory Visit: Payer: Self-pay | Admitting: Internal Medicine

## 2021-06-04 DIAGNOSIS — I10 Essential (primary) hypertension: Secondary | ICD-10-CM

## 2021-06-04 DIAGNOSIS — F4321 Adjustment disorder with depressed mood: Secondary | ICD-10-CM

## 2021-06-04 NOTE — BH Specialist Note (Signed)
Integrated Behavioral Health via Telemedicine Visit  06/04/2021 Brittany Elliott  Number of [facility] visits: 3 Session Start time: 1:50pm  Session End time: 2:20pm Total time: 30  Referring Provider: Karle Plumber, MD Patient/Family location: Work [facility] Provider location: [facility] All persons participating in visit: Pt and LCSWA Types of Service: Individual psychotherapy and Video visit  I connected with Brittany Elliott via Video Enabled Telemedicine Application  (Video is Caregility application) and verified that I am speaking with the correct person using two identifiers. Discussed confidentiality: Yes   I discussed the limitations of telemedicine and the availability of in person appointments.  Discussed there is a possibility of technology failure and discussed alternative modes of communication if that failure occurs.  I discussed that engaging in this telemedicine visit, they consent to the provision of behavioral healthcare and the services will be billed under their insurance.  Patient and/or legal guardian expressed understanding and consented to Telemedicine visit: Yes   Presenting Concerns: Patient and/or family reports the following symptoms/concerns: Reports that she is continuing to adjust her boyfriend no longer being here. Reports that she often misses him. Reports missing being able to talk to her boyfriend and often questioning how he would handle certain issues that arise. Duration of problem: 5 months; Severity of problem: moderate  Patient and/or Family's Strengths/Protective Factors: Social and Emotional competence and Concrete supports in place (healthy food, safe environments, etc.)  Goals Addressed: Patient will:  Reduce symptoms of: depression   Increase knowledge and/or ability of: coping skills   Demonstrate ability to: Begin healthy grieving over loss  Progress towards  Goals: Ongoing  Interventions: Interventions utilized:  CBT Cognitive Behavioral Therapy and Supportive Counseling Standardized Assessments completed: Not Needed  Patient and/or Family Response: Pt receptive to tx. Pt receptive to psychoeducation provided on grief. Pt receptive to cognitive restructuring to decrease unhelpful thoughts and to assist with cognitive processing.  Assessment: Denies SI/HI. Denies auditory/visual hallucinations. Patient currently experiencing difficulty adjusting to her boyfriend's death. Pt appears to have difficulty with questioning her decisions related to her boyfriend. Pt desires to ensure that her boyfriend would have been happy with the decisions being made about him and his son. Pt has difficulty with processing however, is coping healthily.   Patient may benefit from ongoing brief CBT and engaging in an automatic thought record. Pt would also benefit from utilizing an empty chair technique. LCSWA provided psychoeduation on grief. LCSWA utilized cognitive restructuring to decrease unhelpful thoughts related to her decisions about her boyfriend and his son. LCSWA assisted pt with cognitive processing. LCSWA will fu with pt.  Plan: Follow up with behavioral health clinician on : 06/25/21 Behavioral recommendations: Utilize grief journalling Referral(s): [place] (In Clinic)  I discussed the assessment and treatment plan with the patient and/or parent/guardian. They were provided an opportunity to ask questions and all were answered. They agreed with the plan and demonstrated an understanding of the instructions.   They were advised to call back or seek an in-person evaluation if the symptoms worsen or if the condition fails to improve as anticipated.  Evertte Sones C Taquanna Borras, LCSW

## 2021-06-04 NOTE — Telephone Encounter (Signed)
Requested Prescriptions  Pending Prescriptions Disp Refills   hydrochlorothiazide (HYDRODIURIL) 25 MG tablet [Pharmacy Med Name: HYDROCHLOROTHIAZIDE 25 MG TAB] 90 tablet 1    Sig: TAKE 1 TABLET (25 MG TOTAL) BY MOUTH DAILY.     Cardiovascular: Diuretics - Thiazide Passed - 06/04/2021 12:09 AM      Passed - Ca in normal range and within 360 days    Calcium  Date Value Ref Range Status  05/07/2021 9.5 8.7 - 10.2 mg/dL Final         Passed - Cr in normal range and within 360 days    Creatinine, Ser  Date Value Ref Range Status  05/07/2021 0.77 0.57 - 1.00 mg/dL Final         Passed - K in normal range and within 360 days    Potassium  Date Value Ref Range Status  05/07/2021 3.9 3.5 - 5.2 mmol/L Final         Passed - Na in normal range and within 360 days    Sodium  Date Value Ref Range Status  05/07/2021 142 134 - 144 mmol/L Final         Passed - Last BP in normal range    BP Readings from Last 1 Encounters:  05/29/21 114/71         Passed - Valid encounter within last 6 months    Recent Outpatient Visits          2 months ago Essential hypertension   [facility] Marcine Matar, MD   7 months ago Essential hypertension   [facility] Jonah Blue B, MD   12 months ago Obesity (BMI 30.0-34.9)   [facility] Marcine Matar, MD   1 year ago Need for immunization against influenza   [facility] Lois Huxley, Cornelius Moras, RPH-CPP   1 year ago Essential hypertension    [facility] Cain Saupe, MD

## 2021-06-05 ENCOUNTER — Encounter: Payer: Self-pay | Admitting: Internal Medicine

## 2021-06-05 DIAGNOSIS — Z87898 Personal history of other specified conditions: Secondary | ICD-10-CM

## 2021-06-18 ENCOUNTER — Ambulatory Visit (INDEPENDENT_AMBULATORY_CARE_PROVIDER_SITE_OTHER): Payer: BC Managed Care – PPO | Admitting: Family Medicine

## 2021-06-25 ENCOUNTER — Encounter: Payer: Self-pay | Admitting: Internal Medicine

## 2021-06-25 ENCOUNTER — Ambulatory Visit (HOSPITAL_BASED_OUTPATIENT_CLINIC_OR_DEPARTMENT_OTHER): Payer: BC Managed Care – PPO | Admitting: Clinical

## 2021-06-25 DIAGNOSIS — F4323 Adjustment disorder with mixed anxiety and depressed mood: Secondary | ICD-10-CM | POA: Diagnosis not present

## 2021-06-29 ENCOUNTER — Encounter: Payer: Self-pay | Admitting: Internal Medicine

## 2021-06-29 ENCOUNTER — Other Ambulatory Visit: Payer: Self-pay

## 2021-06-29 ENCOUNTER — Ambulatory Visit: Payer: BC Managed Care – PPO | Attending: Internal Medicine | Admitting: Internal Medicine

## 2021-06-29 VITALS — BP 126/86 | HR 81 | Resp 16 | Ht 68.0 in | Wt 240.0 lb

## 2021-06-29 DIAGNOSIS — I10 Essential (primary) hypertension: Secondary | ICD-10-CM | POA: Diagnosis not present

## 2021-06-29 DIAGNOSIS — R238 Other skin changes: Secondary | ICD-10-CM | POA: Diagnosis not present

## 2021-06-29 DIAGNOSIS — E669 Obesity, unspecified: Secondary | ICD-10-CM | POA: Diagnosis not present

## 2021-06-29 MED ORDER — FLUOCINOLONE ACETONIDE 0.01 % EX SOLN: CUTANEOUS | 0 refills | Status: AC

## 2021-06-29 MED ORDER — LORATADINE 10 MG PO TABS: 10.0000 mg | ORAL_TABLET | Freq: Every day | ORAL | 1 refills | Status: AC

## 2021-06-29 NOTE — Patient Instructions (Signed)
Please get back on track with healthy eating and regular exercise.

## 2021-06-29 NOTE — Progress Notes (Signed)
Concerns about scalp and skin irritation

## 2021-06-29 NOTE — Progress Notes (Signed)
Patient ID: Brittany Elliott, female    DOB: 1985-06-30  MRN: [id number]  CC: skin irritation   Subjective: Brittany Elliott is a 36 y.o. female who presents for chronic ds management Her concerns today include:  PT with hx of HTN, obesity.  C/o scalp irritation that started almost 2 weeks after she had her hair braided with fake hair.  Scalp became very itchy and she noticed bumps across her forehead.  She removed the we have and washed her hair.  Issues seem to calm down.  About a week later, she had her natural hair corn-row.  Gel was placed on the hair afterwards.  She woke up in the middle of the night with irritation and burning of the scalp.  She immediately got up and washed her hair again.  This week she feels intermittent burning,, itching and feeling like she has a rash at the back of the head.  She noticed slight flaking around the frontal scalp.  She discontinued tying her hair down at night and just sleeps on a silk pillowcase instead.  She feels symptoms are worse when she gets hot or upset.  She has been using natural oils on her hair since then.  Currently has no itching or burning.  She has been using Benadryl, Tylenol and ibuprofen.  She did mention that she was seen at urgent care 1 week ago with scalp itching.  She is given a cortisone shot which she felt helped to calm it down.  -She is concerned that she may have an infection of her scalp.  She is wondering whether she needs antibiotics.  HTN:  on HCTZ but missed a few doses this wk and last wk. Did not take HCTZ as yet today. Does not check BP at home often.  BP good at [facility] visits  Obesity:  last seen by [facility] 05/07/2022.  Missed appt.  Has reschedule for next wk She has not been following her meal plan.  "I've been eating whatever for comfort."  Has not been in the mood to get back in the gym.  She has been worried and stressed about her scalp issue.   Patient Active Problem List   Diagnosis Date Noted   Eating  disorder 08/02/2020   At risk for side effect of medication 08/02/2020   Fatigue 07/12/2020   At risk for diabetes mellitus 07/12/2020   At risk for malnutrition 06/27/2020   Insulin resistance 04/25/2020   Vitamin D deficiency 04/25/2020   Essential hypertension 04/25/2020   At risk for impaired metabolic function 04/25/2020   Class 2 obesity due to excess calories with body mass index (BMI) of 36.0 to 36.9 in adult 03/28/2020   Hirsutism 06/17/2018   Chlamydia infection affecting pregnancy in first trimester, antepartum 05/12/2014   BV (bacterial vaginosis) 05/09/2014   Spotting affecting pregnancy in first trimester 05/09/2014   Elevated blood pressure affecting pregnancy in first trimester, antepartum 05/09/2014   Subchorionic hemorrhage in first trimester - small 05/09/2014   Hx of metrorrhagia    Metrorrhagia 03/31/2012     Current Outpatient Medications on File Prior to Visit  Medication Sig Dispense Refill   Cholecalciferol (VITAMIN D3) 50 MCG (2000 UT) capsule Take 1 capsule (2,000 Units total) by mouth daily. 30 capsule 0   hydrochlorothiazide (HYDRODIURIL) 25 MG tablet TAKE 1 TABLET (25 MG TOTAL) BY MOUTH DAILY. 90 tablet 1   Insulin Pen Needle (BD PEN NEEDLE NANO U/F) 32G X 4 MM MISC Use Nano needle  with Ozempic 100 each 0   liraglutide (VICTOZA) 18 MG/3ML SOPN Inject 1.8 mg into the skin daily. 12 mL 0   NUVARING 0.12-0.015 MG/24HR vaginal ring Place 1 each vaginally every 21 ( twenty-one) days.     polyethylene glycol powder (GLYCOLAX/MIRALAX) 17 GM/SCOOP powder Take 17 g by mouth 2 (two) times daily as needed. 3350 g 1   No current facility-administered medications on file prior to visit.    No Known Allergies  Social History   Socioeconomic History   Marital status: Single    Spouse name: Not on file   Number of children: Not on file   Years of education: Not on file   Highest education level: Not on file  Occupational History   Occupation: Teacher  Tobacco  Use   Smoking status: Never   Smokeless tobacco: Never  Vaping Use   Vaping Use: Never used  Substance and Sexual Activity   Alcohol use: Yes    Comment: occasional   Drug use: No   Sexual activity: Yes  Other Topics Concern   Not on file  Social History Narrative   Not on file   Social Determinants of Health   Financial Resource Strain: Not on file  Food Insecurity: Not on file  Transportation Needs: Not on file  Physical Activity: Not on file  Stress: Not on file  Social Connections: Not on file  Intimate Partner Violence: Not on file    Family History  Problem Relation Age of Onset   Hypertension Mother    Diabetes Mother    Kidney disease Mother    Heart disease Mother    High blood pressure Father     Past Surgical History:  Procedure Laterality Date   INDUCED ABORTION      ROS: Review of Systems Negative except as stated above  PHYSICAL EXAM: BP 126/86    Pulse 81    Resp 16    Ht 5\' 8"  (1.727 m)    Wt 240 lb (108.9 kg)    SpO2 98%    BMI 36.49 kg/m   Physical Exam  General appearance - alert, well appearing, and in no distress Mental status - normal mood, behavior, speech, dress, motor activity, and thought processes Skin -scalp: Patient's hair and scalp are very dry.  No rash seen on the scalp.  Few scattered flakes seen.  She has dryness around the scalp line frontal.  No rash seen at the back of the head. Neck: No cervical lymphadenopathy   CMP Latest Ref Rng & Units 05/07/2021 01/11/2021 03/10/2020  Glucose 70 - 99 mg/dL 89 84 84  BUN 6 - 20 mg/dL 13 12 13   Creatinine 0.57 - 1.00 mg/dL 6.960.77 2.950.82 2.840.85  Sodium 134 - 144 mmol/L 142 139 139  Potassium 3.5 - 5.2 mmol/L 3.9 3.6 4.3  Chloride 96 - 106 mmol/L 105 102 102  CO2 20 - 29 mmol/L 25 21 23   Calcium 8.7 - 10.2 mg/dL 9.5 9.4 9.6  Total Protein 6.0 - 8.5 g/dL 6.7 6.8 -  Total Bilirubin 0.0 - 1.2 mg/dL 0.4 0.6 -  Alkaline Phos 44 - 121 IU/L 44 37(L) -  AST 0 - 40 IU/L 7 6 -  ALT 0 - 32 IU/L  11 11 -   Lipid Panel     Component Value Date/Time   CHOL 141 05/07/2021 0800   TRIG 47 05/07/2021 0800   HDL 58 05/07/2021 0800   LDLCALC 72 05/07/2021 0800    CBC  Component Value Date/Time   WBC 8.4 11/13/2015 1601   RBC 4.12 11/13/2015 1601   HGB 12.0 11/13/2015 1601   HCT 36.4 11/13/2015 1601   PLT 314 11/13/2015 1601   MCV 88.3 11/13/2015 1601   MCH 29.1 11/13/2015 1601   MCHC 33.0 11/13/2015 1601   RDW 13.3 11/13/2015 1601    ASSESSMENT AND PLAN:  1. Scalp irritation Sounds like intermittent allergic reaction to artificial hair and hair products. Stressed the importance of keeping area moisturized and scalp moisturize. I do not think she needs antibiotics as I see no signs of infection. Recommend referral to dermatology.  In the meantime I recommend that we try her with Claritin and a steroid solution to the scalp to be used as needed.  Went over with her how to use it. - Ambulatory referral to Dermatology - loratadine (CLARITIN) 10 MG tablet; Take 1 tablet (10 mg total) by mouth daily.  Dispense: 30 tablet; Refill: 1 - fluocinolone (SYNALAR) 0.01 % external solution; Apply small quarter size amount to scalp daily as needed for scalp irritation.  Dispense: 60 mL; Refill: 0  2. Essential hypertension Not at goal.  Recommend compliance with hydrochlorothiazide.  3. Obesity (BMI 30-39.9) Encouraged her to get back on track with her eating and exercise.  Keep upcoming appointment with medical weight management.   Patient was given the opportunity to ask questions.  Patient verbalized understanding of the plan and was able to repeat key elements of the plan.   Orders Placed This Encounter  Procedures   Ambulatory referral to Dermatology     Requested Prescriptions   Signed Prescriptions Disp Refills   loratadine (CLARITIN) 10 MG tablet 30 tablet 1    Sig: Take 1 tablet (10 mg total) by mouth daily.   fluocinolone (SYNALAR) 0.01 % external solution 60 mL 0     Sig: Apply small quarter size amount to scalp daily as needed for scalp irritation.    Return in about 4 months (around 10/27/2021).  Jonah Blue, MD, FACP

## 2021-06-29 NOTE — BH Specialist Note (Signed)
Integrated Behavioral Health via Telemedicine Visit  06/25/21 Brittany Elliott [id number]  Number of Integrated Behavioral Health visits: 4 Session Start time: 1:40pm  Session End time: 2:21pm Total time:  41 minutes  Referring Provider: Jonah Blue, MD Patient/Family location: Work [facility] Provider location: [facility] Office All persons participating in visit: Pt and LCSWA Types of Service: Individual psychotherapy and Video visit  I connected with Brittany Elliott via Video Enabled Telemedicine Application  (Video is Caregility application) and verified that I am speaking with the correct person using two identifiers. Discussed confidentiality: Yes   I discussed the limitations of telemedicine and the availability of in person appointments.  Discussed there is a possibility of technology failure and discussed alternative modes of communication if that failure occurs.  I discussed that engaging in this telemedicine visit, they consent to the provision of behavioral healthcare and the services will be billed under their insurance.  Patient and/or legal guardian expressed understanding and consented to Telemedicine visit: Yes   Presenting Concerns: Patient and/or family reports the following symptoms/concerns: Reports feeling down at times. Reports experiencing headaches and worries that she may be stressed. Reports that she frequently helps others handle their grief from losing her ex-boyfriend. Reports allowing others to vent to her. Duration of problem: 5 months; Severity of problem: moderate  Patient and/or Family's Strengths/Protective Factors: Social and Emotional competence and Concrete supports in place (healthy food, safe environments, etc.)  Goals Addressed: Patient will:  Reduce symptoms of: depression   Increase knowledge and/or ability of: coping skills   Demonstrate ability to: Begin healthy grieving over loss  Progress towards  Goals: Ongoing  Interventions: Interventions utilized:  CBT Cognitive Behavioral Therapy and Supportive Counseling Standardized Assessments completed: Not Needed  Patient and/or Family Response: Pt receptive to tx. Pt receptive to psychoeducation provided on grief, stress, and its association with physical health. Pt receptive to cognitive restructuring to decrease unhelpful thoughts. Pt receptive to establishing healthy boundaries with family/friends in order to focus on herself. Pt receptive to deep breathing exercises.  Assessment: Denies SI/HI. Patient currently experiencing anxiety and depression related to the grief of her ex-boyfriend. Pt appears to also be experiencing headaches and is concerned that it may be due to stress. Pt has upcoming appt with PCP. Pt appears to frequently help others and is not allowing time for herself to grieve.  Patient may benefit from ongoing brief interventions. LCSWA provided psychoeducation on grief, stress, and its association with physical health. LCSWA utilized cognitive restructuring to decrease negative and unhelpful thoughts. LCSWA encouraged pt to establish healthy boundaries with family/friends in order to focus on herself. LCSWA encouraged pt to utilize deep breathing exercises.  Plan: Follow up with behavioral health clinician on : 07/23/21 Behavioral recommendations: Utilize deep breathing exercises and establish healthy boundaries with family/friends. Referral(s): Integrated [employer] (In Clinic)  I discussed the assessment and treatment plan with the patient and/or parent/guardian. They were provided an opportunity to ask questions and all were answered. They agreed with the plan and demonstrated an understanding of the instructions.   They were advised to call back or seek an in-person evaluation if the symptoms worsen or if the condition fails to improve as anticipated.  Brittany Elliott C Brittany Hartstein, LCSW

## 2021-06-30 ENCOUNTER — Other Ambulatory Visit: Payer: Self-pay

## 2021-06-30 ENCOUNTER — Encounter (HOSPITAL_BASED_OUTPATIENT_CLINIC_OR_DEPARTMENT_OTHER): Payer: Self-pay | Admitting: Emergency Medicine

## 2021-06-30 ENCOUNTER — Emergency Department (HOSPITAL_BASED_OUTPATIENT_CLINIC_OR_DEPARTMENT_OTHER)
Admission: EM | Admit: 2021-06-30 | Discharge: 2021-06-30 | Disposition: A | Discharge status: Home / Self Care | Payer: BC Managed Care – PPO | Attending: Emergency Medicine | Admitting: Emergency Medicine

## 2021-06-30 DIAGNOSIS — R208 Other disturbances of skin sensation: Secondary | ICD-10-CM | POA: Insufficient documentation

## 2021-06-30 DIAGNOSIS — R238 Other skin changes: Secondary | ICD-10-CM

## 2021-06-30 MED ORDER — HYDROXYZINE HCL 25 MG PO TABS: 25.0000 mg | ORAL_TABLET | Freq: Four times a day (QID) | ORAL | 0 refills | Status: DC

## 2021-06-30 NOTE — ED Triage Notes (Signed)
Pt reports removing braids 1/4 due to irritation. Last Sunday, she put gel in her hair that caused burning. Endorses feeling a rash on her scalp and intermittent burning. Appt with allergist at end of month. Cortisone shot given at [facility] recently.

## 2021-06-30 NOTE — ED Provider Notes (Signed)
[facility] EMERGENCY DEPT Provider Note   CSN: [id number] Arrival date & time: 06/30/21  0941     History  No chief complaint on file.   Brittany Elliott is a 36 y.o. female.  36 yo F with a chief complaints of scalp irritation.  This been going on for about a month.  She applied a gel in her hair to try and help her braid it and woke up in the middle of the night with severe itching and burning.  She since then has had to wash her hair frequently and has frequent episodes where she feels like her hair is burning.  She has seen her family doctor as well as urgent care and has been prescribed different courses of steroids as well as been on antihistamines.  She has follow-up with a dermatologist and with an allergist but not until March.  She is here today with persistent symptoms.       Home Medications Prior to Admission medications   Medication Sig Start Date End Date Taking? Authorizing Provider  hydrOXYzine (ATARAX) 25 MG tablet Take 1 tablet (25 mg total) by mouth every 6 (six) hours. 06/30/21  Yes Melene Plan, DO  Cholecalciferol (VITAMIN D3) 50 MCG (2000 UT) capsule Take 1 capsule (2,000 Units total) by mouth daily. 05/29/21   Alois Cliche, PA-C  fluocinolone (SYNALAR) 0.01 % external solution Apply small quarter size amount to scalp daily as needed for scalp irritation. 06/29/21   Marcine Matar, MD  hydrochlorothiazide (HYDRODIURIL) 25 MG tablet TAKE 1 TABLET (25 MG TOTAL) BY MOUTH DAILY. 06/04/21   Marcine Matar, MD  Insulin Pen Needle (BD PEN NEEDLE NANO U/F) 32G X 4 MM MISC Use Nano needle with Ozempic 02/15/21   Quillian Quince D, MD  liraglutide (VICTOZA) 18 MG/3ML SOPN Inject 1.8 mg into the skin daily. 05/31/21   Alois Cliche, PA-C  loratadine (CLARITIN) 10 MG tablet Take 1 tablet (10 mg total) by mouth daily. 06/29/21   Marcine Matar, MD  NUVARING 0.12-0.015 MG/24HR vaginal ring Place 1 each vaginally every 21 ( twenty-one) days. 04/15/18    [provider]  polyethylene glycol powder (GLYCOLAX/MIRALAX) 17 GM/SCOOP powder Take 17 g by mouth 2 (two) times daily as needed. 01/11/21   Wilder Glade, MD      Allergies    Patient has no known allergies.    Review of Systems   Review of Systems  Physical Exam Updated Vital Signs BP (!) 153/99    Pulse 79    Temp 98.6 F (37 C)    Resp 16    Ht 5\' 8"  (1.727 m)    Wt 108 kg    SpO2 100%    BMI 36.20 kg/m  Physical Exam Vitals and nursing note reviewed.  Constitutional:      General: She is not in acute distress.    Appearance: She is well-developed. She is not diaphoretic.  HENT:     Head: Normocephalic and atraumatic.     Comments: I do not appreciate any edema erythema fluctuance to the scalp Eyes:     Pupils: Pupils are equal, round, and reactive to light.  Cardiovascular:     Rate and Rhythm: Normal rate and regular rhythm.     Heart sounds: No murmur heard.   No friction rub. No gallop.  Pulmonary:     Effort: Pulmonary effort is normal.     Breath sounds: No wheezing or rales.  Abdominal:  General: There is no distension.     Palpations: Abdomen is soft.     Tenderness: There is no abdominal tenderness.  Musculoskeletal:        General: No tenderness.     Cervical back: Normal range of motion and neck supple.  Skin:    General: Skin is warm and dry.  Neurological:     Mental Status: She is alert and oriented to person, place, and time.  Psychiatric:        Behavior: Behavior normal.    ED Results / Procedures / Treatments   Labs (all labs ordered are listed, but only abnormal results are displayed) Labs Reviewed - No data to display  EKG None  Radiology No results found.  Procedures Procedures    Medications Ordered in ED Medications - No data to display  ED Course/ Medical Decision Making/ A&P                           Medical Decision Making Risk Prescription drug management.   Patient is a 36 y.o. female with a cc of  scalp irritation.  Going on for the past month.  No obvious finding on exam.  Patient is already tried multiple different therapies including injectable steroids and antihistamines.  She could simply have a dry skin from increased shampooing.  I suggested trying to limit her shampooing and trying to use shampoo that is gentle on scalp.  We will have her follow-up with her PCP.    10:48 AM:  I have discussed the diagnosis/risks/treatment options with the patient and family.  Evaluation and diagnostic testing in the emergency department does not suggest an emergent condition requiring admission or immediate intervention beyond what has been performed at this time.  They will follow up with  PCP. We also discussed returning to the ED immediately if new or worsening sx occur. We discussed the sx which are most concerning (e.g., sudden worsening pain, fever, inability to tolerate by mouth) that necessitate immediate return. Medications administered to the patient during their visit and any new prescriptions provided to the patient are listed below.  Medications given during this visit Medications - No data to display   The patient appears reasonably screen and/or stabilized for discharge and I doubt any other medical condition or other [facility] requiring further screening, evaluation, or treatment in the ED at this time prior to discharge.        Final Clinical Impression(s) / ED Diagnoses Final diagnoses:  Scalp irritation    Rx / DC Orders ED Discharge Orders          Ordered    hydrOXYzine (ATARAX) 25 MG tablet  Every 6 hours        06/30/21 1023              Melene Plan, DO 06/30/21 1048

## 2021-06-30 NOTE — Discharge Instructions (Signed)
I am not sure of the cause of your symptoms.  Where the more common things and will cause persistent irritation of the scalp with his dry skin.  With you having increased washing and drying more products on your head this could have caused your symptoms.  Usually we will have you try to wash your hair less frequently and will sometimes have you try moisturizing shampoos or ones that people use for dandruff.  Please follow-up with your family doctor.

## 2021-07-06 ENCOUNTER — Other Ambulatory Visit (INDEPENDENT_AMBULATORY_CARE_PROVIDER_SITE_OTHER): Payer: Self-pay | Admitting: Physician Assistant

## 2021-07-09 ENCOUNTER — Other Ambulatory Visit: Payer: Self-pay

## 2021-07-09 ENCOUNTER — Encounter (INDEPENDENT_AMBULATORY_CARE_PROVIDER_SITE_OTHER): Payer: Self-pay | Admitting: Physician Assistant

## 2021-07-09 ENCOUNTER — Ambulatory Visit (INDEPENDENT_AMBULATORY_CARE_PROVIDER_SITE_OTHER): Payer: BC Managed Care – PPO | Admitting: Physician Assistant

## 2021-07-09 VITALS — BP 133/85 | HR 103 | Temp 98.3°F | Ht 69.0 in | Wt 243.0 lb

## 2021-07-09 DIAGNOSIS — E8881 Metabolic syndrome: Secondary | ICD-10-CM | POA: Diagnosis not present

## 2021-07-09 DIAGNOSIS — E669 Obesity, unspecified: Secondary | ICD-10-CM | POA: Diagnosis not present

## 2021-07-09 DIAGNOSIS — Z6836 Body mass index (BMI) 36.0-36.9, adult: Secondary | ICD-10-CM

## 2021-07-09 DIAGNOSIS — Z6835 Body mass index (BMI) 35.0-35.9, adult: Secondary | ICD-10-CM | POA: Diagnosis not present

## 2021-07-09 DIAGNOSIS — Z9189 Other specified personal risk factors, not elsewhere classified: Secondary | ICD-10-CM

## 2021-07-09 MED ORDER — BD PEN NEEDLE NANO U/F 32G X 4 MM MISC: 0 refills | Status: DC

## 2021-07-09 MED ORDER — SAXENDA 18 MG/3ML ~~LOC~~ SOPN: PEN_INJECTOR | SUBCUTANEOUS | 0 refills | Status: DC

## 2021-07-09 NOTE — Progress Notes (Signed)
Chief Complaint:   OBESITY Brittany Elliott is here to discuss her progress with her obesity treatment plan along with follow-up of her obesity related diagnoses. Brittany Elliott is on the Category 3 Plan and states she is following her eating plan approximately 5% of the time. Brittany Elliott states she is doing 0 minutes 0 times per week.  Today's visit was #: 24 Starting weight: 245 lbs Starting date: 03/27/2020 Today's weight: 243 lbs Today's date: 07/09/2021 Total lbs lost to date: 2 Total lbs lost since last in-office visit: 0  Interim History: Laretha reports that she had an allergic reaction to a hair product and she was at home a lot over the last 2 weeks. She over-snacked due to food being so accessible. She is ready to get back on track.  Subjective:   1. Insulin resistance Brittany Elliott's last insulin level was 18.8. she reports polyphagia but she did not take her [name] for 2 weeks.  2. At risk for diabetes mellitus Brittany Elliott is at higher than average risk for developing diabetes due to her obesity.  Assessment/Plan:   1. Insulin resistance Brittany Elliott is to take [name] as prescribed, and she will continue her meal plan and decreasing simple carbohydrates to help decrease the risk of diabetes. Brittany Elliott agreed to follow-up with [name] as directed to closely monitor her progress.  2. At risk for diabetes mellitus Brittany Elliott was given approximately 15 minutes of diabetic education and counseling today. We discussed intensive lifestyle modifications today with an emphasis on weight loss as well as increasing exercise and decreasing simple carbohydrates in her diet. We also reviewed medication options with an emphasis on risk versus benefits of those discussed.  Repetitive spaced learning was employed today to elicit superior memory formation and behavioral change.  3. Obesity with current BMI 35.87 Brittany Elliott is currently in the action stage of change. As such, her goal is to continue with weight loss  efforts. She has agreed to the Category 3 Plan.   We discussed various medication options to help [facility] with her weight loss efforts and we both agreed to continue [name] 2.4 mg, and we will refill for 1 month, and refill pen needles #100 with no refills.  - Insulin Pen Needle (BD PEN NEEDLE NANO U/F) 32G X 4 MM MISC; Use Nano needle with Ozempic  Dispense: 100 each; Refill: 0 - Liraglutide -Weight Management ([name]) 18 MG/3ML SOPN; INJECT 2.4 MG UNDER THE SKIN ONCE DAILY Strength: 18 MG/3ML  Dispense: 15 mL; Refill: 0  Exercise goals: No exercise has been prescribed at this time.  Behavioral modification strategies: increasing lean protein intake and decreasing simple carbohydrates.  Brittany Elliott has agreed to follow-up with our clinic in 2 weeks. She was informed of the importance of frequent follow-up visits to maximize her success with intensive lifestyle modifications for her multiple health conditions.   Objective:   Blood pressure 133/85, pulse (!) 103, temperature 98.3 F (36.8 C), height 5\' 9"  (1.753 m), weight 243 lb (110.2 kg), SpO2 96 %. Body mass index is 35.88 kg/m.  General: Cooperative, alert, well developed, in no acute distress. HEENT: Conjunctivae and lids unremarkable. Cardiovascular: Regular rhythm.  Lungs: Normal work of breathing. Neurologic: No focal deficits.   Lab Results  Component Value Date   CREATININE 0.77 05/07/2021   BUN 13 05/07/2021   NA 142 05/07/2021   K 3.9 05/07/2021   CL 105 05/07/2021   CO2 25 05/07/2021   Lab Results  Component Value Date   ALT 11 05/07/2021  AST 7 05/07/2021   ALKPHOS 44 05/07/2021   BILITOT 0.4 05/07/2021   Lab Results  Component Value Date   HGBA1C 5.4 05/07/2021   HGBA1C 5.4 01/11/2021   HGBA1C 5.5 03/27/2020   Lab Results  Component Value Date   INSULIN 18.8 05/07/2021   INSULIN 14.2 01/11/2021   INSULIN 13.2 03/27/2020   Lab Results  Component Value Date   TSH 1.640 03/10/2020   Lab Results   Component Value Date   CHOL 141 05/07/2021   HDL 58 05/07/2021   LDLCALC 72 05/07/2021   TRIG 47 05/07/2021   Lab Results  Component Value Date   VD25OH 86.0 05/07/2021   VD25OH 80.8 01/11/2021   VD25OH 36.0 03/27/2020   Lab Results  Component Value Date   WBC 8.4 11/13/2015   HGB 12.0 11/13/2015   HCT 36.4 11/13/2015   MCV 88.3 11/13/2015   PLT 314 11/13/2015   No results found for: IRON, TIBC, FERRITIN  Attestation Statements:   Reviewed by clinician on day of visit: allergies, medications, problem list, medical history, surgical history, family history, social history, and previous encounter notes.   Trude Mcburney, am acting as transcriptionist for [employer], PA-C.  I have reviewed the above documentation for accuracy and completeness, and I agree with the above. Alois Cliche, PA-C

## 2021-07-10 ENCOUNTER — Telehealth (INDEPENDENT_AMBULATORY_CARE_PROVIDER_SITE_OTHER): Payer: Self-pay | Admitting: Physician Assistant

## 2021-07-10 ENCOUNTER — Encounter (INDEPENDENT_AMBULATORY_CARE_PROVIDER_SITE_OTHER): Payer: Self-pay

## 2021-07-10 NOTE — Telephone Encounter (Signed)
Prior authorization approved for [name]. Patient sent message via mychart.  °

## 2021-07-17 NOTE — Progress Notes (Signed)
NEW PATIENT Date of Service/Encounter:  07/18/21 Referring provider: Marcine Matar, MD Primary care provider: Marcine Matar, MD  Subjective:  Brittany Elliott is a 36 y.o. female with a PMHx of obesity, hirsutism, vitamin D deficiency, hypertension, presenting today for evaluation of pruritus. History obtained from: chart review and patient.   Friday before Christmas had hair braided and twisted.  Jan 4th took out braids because itching and burning started around [place] and it woke her up and was really intense. Her eyes were itching, and it was fine once she took out her braids.  About two weeks later, she braided her own hair and applied a gel to it and that night she woke up in the middle of the night burning and itching.  She woke up and washed it out.  The next day or two she did have an episode of hives that lasted a few hours and broke out on her chest.  Her hands were itching.  She did feel like she had really fine bumps on her forehead and posterior scalp when she had the fake hair and gel in her hair.  Rash would come and go for weeks to days. The last time she was symptomatic was 2 days ago-she felt her scalp was irritated.  She is avoiding the gel and the fake hair, but suspects that something in the products she used might be causing the symptoms.  She is scheduled to see a Dermatologist as well.  Per PCP note on 06/29/21-itching of scalp with bumps on forehead following having her hair braided with fake hair; symptoms worse when hot or upset, given cortisone shot at [facility] which helped.  Given fluocolone solution and claritin with referral to specaialist  Other allergy screening: Asthma: no Rhino conjunctivitis: no Food allergy: no Medication allergy: no Hymenoptera allergy: no Eczema:no  Past Medical History: Past Medical History:  Diagnosis Date   Angio-edema    High blood pressure    Hx of metrorrhagia    Obese    Urticaria    Vitamin D deficiency     Medication List:  Current Outpatient Medications  Medication Sig Dispense Refill   Cholecalciferol (VITAMIN D3) 50 MCG (2000 UT) capsule Take 1 capsule (2,000 Units total) by mouth daily. 30 capsule 0   fluocinolone (SYNALAR) 0.01 % external solution Apply small quarter size amount to scalp daily as needed for scalp irritation. 60 mL 0   hydrochlorothiazide (HYDRODIURIL) 25 MG tablet TAKE 1 TABLET (25 MG TOTAL) BY MOUTH DAILY. 90 tablet 1   hydrOXYzine (ATARAX) 25 MG tablet Take 1 tablet (25 mg total) by mouth every 6 (six) hours. 12 tablet 0   Insulin Pen Needle (BD PEN NEEDLE NANO U/F) 32G X 4 MM MISC Use Nano needle with Ozempic 100 each 0   Liraglutide -Weight Management ([name]) 18 MG/3ML SOPN INJECT 2.4 MG UNDER THE SKIN ONCE DAILY Strength: 18 MG/3ML 15 mL 0   NUVARING 0.12-0.015 MG/24HR vaginal ring Place 1 each vaginally every 21 ( twenty-one) days.     polyethylene glycol powder (GLYCOLAX/MIRALAX) 17 GM/SCOOP powder Take 17 g by mouth 2 (two) times daily as needed. 3350 g 1   loratadine (CLARITIN) 10 MG tablet Take 1 tablet (10 mg total) by mouth daily. (Patient not taking: Reported on 07/18/2021) 30 tablet 1   No current facility-administered medications for this visit.   Known Allergies:  No Known Allergies Past Surgical History: Past Surgical History:  Procedure Laterality Date  INDUCED ABORTION     Family History: Family History  Problem Relation Age of Onset   Hypertension Mother    Diabetes Mother    Kidney disease Mother    Heart disease Mother    High blood pressure Father    Social History: Brittany Elliott lives in a house built over 50 years ago, no water damage, wood floors, gas heating, central AC, no pets, dust mite protection on the bedding but not pillows, she is not a smoker but is exposed to secondhand smoke, she is a Runner, broadcasting/film/video for 12 years, no HEPA filter in her home..   ROS:  All other systems negative except as noted per HPI.  Objective:  Blood pressure  140/90, pulse 95, temperature 98.2 F (36.8 C), resp. rate 18, height 5\' 9"  (1.753 m), weight 248 lb 3.2 oz (112.6 kg), SpO2 99 %. Body mass index is 36.65 kg/m. Physical Exam:  General Appearance:  Alert, cooperative, no distress, appears stated age  Head:  Normocephalic, without obvious abnormality, atraumatic  Eyes:  Conjunctiva clear, EOM's intact  Nose: Nares normal, hypertrophic turbinates, normal mucosa, no visible anterior polyps, and septum midline  Throat: Lips, tongue normal; teeth and gums normal, normal posterior oropharynx  Neck: Supple, symmetrical  Lungs:   clear to auscultation bilaterally, Respirations unlabored, no coughing  Heart:  regular rate and rhythm and no murmur, Appears well perfused  Extremities: No edema  Skin: Skin color, texture, turgor normal, no rashes or lesions on visualized portions of skin  Neurologic: No gross deficits     Diagnostics: Skin Testing: Environmental allergy panel and select foods.  Adequate controls. Results discussed with patient/family.  Airborne Adult Perc - 07/18/21 0945     Time Antigen Placed 0920    Allergen Manufacturer 07/20/21    Location Back    Number of Test 59    1. Control-Buffer 50% Glycerol Negative    2. Control-Histamine 1 mg/ml 3+    3. Albumin saline Negative    4. Bahia Negative    5. Waynette Buttery Negative    6. Johnson Negative    7. Kentucky Blue Negative    8. Meadow Fescue Negative    9. Perennial Rye Negative    10. Sweet Vernal Negative    11. Timothy Negative    12. Cocklebur Negative    13. Burweed Marshelder Negative    14. Ragweed, short Negative    15. Ragweed, Giant Negative    16. Plantain,  English Negative    17. Lamb's Quarters Negative    18. Sheep Sorrell Negative    19. Rough Pigweed Negative    20. Marsh Elder, Rough Negative    21. Mugwort, Common Negative    22. Ash mix Negative    23. Birch mix Negative    24. Beech American Negative    25. Box, Elder Negative    26. Cedar,  red Negative    27. Cottonwood, [place] Negative    28. Elm mix Negative    29. Hickory Negative    30. Maple mix Negative    31. Oak, [place] mix 2+    32. Pecan Pollen 2+    33. Pine mix Negative    34. Sycamore Eastern Negative    35. Walnut, Black Pollen Negative    36. Alternaria alternata Negative    37. Cladosporium Herbarum Negative    38. Aspergillus mix Negative    39. Penicillium mix Negative    40. Bipolaris sorokiniana (Helminthosporium) Negative  41. Drechslera spicifera (Curvularia) Negative    42. Mucor plumbeus Negative    43. Fusarium moniliforme Negative    44. Aureobasidium pullulans (pullulara) Negative    45. Rhizopus oryzae Negative    46. Botrytis cinera Negative    47. Epicoccum nigrum Negative    48. Phoma betae Negative    49. Candida Albicans Negative    50. Trichophyton mentagrophytes Negative    51. Mite, D Farinae  5,000 AU/ml Negative    52. Mite, D Pteronyssinus  5,000 AU/ml 3+    53. Cat Hair 10,000 BAU/ml Negative    54.  Dog Epithelia Negative    55. Mixed Feathers Negative    56. Horse Epithelia Negative    57. Cockroach, German Negative    58. Mouse Negative             Food Adult Perc - 07/18/21 0900     Time Antigen Placed 5638    Allergen Manufacturer Waynette Buttery    Location Back    Number of allergen test 11    1. Peanut Negative    2. Soybean Negative    3. Wheat Negative    4. Sesame Negative    5. Milk, cow Negative    6. Egg White, Chicken Negative    7. Casein Negative    8. Shellfish Mix Negative    9. Fish Mix Negative    10. Cashew Negative    13. Almond Negative             Allergy testing results were read and interpreted by myself, documented by clinical staff.  Assessment and Plan   Patient Instructions  Itching Scalp with Rash:  - suspect contact dermatitis - recommend patch testing, if can bring sample of hair extensions we can add this as well to your patch testing - environmental allergy  testing today was positive to dust mites, and borderline to trees; avoidance measures below - food allergy testing to top 10 most common foods including almond was negative, do not suspect food allergy based on history, no avoidance necessary  Symptomatic treatment:  - for itching, Claritin 10 mg can take up up to twice daily - fluocinolone liquid solution-can use on scalp up to twice daily as needed for itching and rash - for now, avoid gel used during most recent reaction  Schedule patch testing on your way out. Must be off topical steroids (including fluocinolone) for at least 2 weeks prior to testing and off systemic steroids (oral or injectables) for at least 4 weeks prior to testing.  Patch placement will be Monday, removal Wednesday, and final read Friday.  Must keep back dry during this entire process-no bathing, excess sweating, etc.  This note in its entirety was forwarded to the Provider who requested this consultation.  Thank you for your kind referral. I appreciate the opportunity to take part in [facility] care. Please do not hesitate to contact me with questions.  Sincerely,  Tonny Bollman, MD [facility]

## 2021-07-18 ENCOUNTER — Encounter: Payer: Self-pay | Admitting: Internal Medicine

## 2021-07-18 ENCOUNTER — Ambulatory Visit: Payer: BC Managed Care – PPO | Admitting: Internal Medicine

## 2021-07-18 ENCOUNTER — Other Ambulatory Visit: Payer: Self-pay

## 2021-07-18 VITALS — BP 140/90 | HR 95 | Temp 98.2°F | Resp 18 | Ht 69.0 in | Wt 248.2 lb

## 2021-07-18 DIAGNOSIS — H1013 Acute atopic conjunctivitis, bilateral: Secondary | ICD-10-CM

## 2021-07-18 DIAGNOSIS — R21 Rash and other nonspecific skin eruption: Secondary | ICD-10-CM

## 2021-07-18 DIAGNOSIS — L508 Other urticaria: Secondary | ICD-10-CM | POA: Diagnosis not present

## 2021-07-18 DIAGNOSIS — L299 Pruritus, unspecified: Secondary | ICD-10-CM

## 2021-07-18 NOTE — Patient Instructions (Signed)
Itching Scalp with Rash:  - suspect contact dermatitis - recommend patch testing, if can bring sample of hair extensions we can add this as well to your patch testing - environmental allergy testing today was positive to dust mites, and borderline to trees; avoidance measures below - food allergy testing to top 10 most common foods including almond was negative, do not suspect food allergy based on history, no avoidance necessary  Symptomatic treatment:  - for itching, Claritin 10 mg can take up up to twice daily - fluocinolone liquid solution-can use on scalp up to twice daily as needed for itching and rash - for now, avoid gel used during most recent reaction  Schedule patch testing on your way out. Must be off topical steroids (including fluocinolone) for at least 2 weeks prior to testing and off systemic steroids (oral or injectables) for at least 4 weeks prior to testing.  Patch placement will be Monday, removal Wednesday, and final read Friday.  Must keep back dry during this entire process-no bathing, excess sweating, etc.  Reducing Pollen Exposure  The American Academy of Allergy, Asthma and Immunology suggests the following steps to reduce your exposure to pollen during allergy seasons.    Do not hang sheets or clothing out to dry; pollen may collect on these items. Do not mow lawns or spend time around freshly cut grass; mowing stirs up pollen. Keep windows closed at night.  Keep car windows closed while driving. Minimize morning activities outdoors, a time when pollen counts are usually at their highest. Stay indoors as much as possible when pollen counts or humidity is high and on windy days when pollen tends to remain in the air longer. Use air conditioning when possible.  Many air conditioners have filters that trap the pollen spores. Use a HEPA room air filter to remove pollen form the indoor air you breathe.  DUST MITE AVOIDANCE MEASURES:  There are three main measures  that need and can be taken to avoid house dust mites:  Reduce accumulation of dust in general -reduce furniture, clothing, carpeting, books, stuffed animals, especially in bedroom  Separate yourself from the dust -use pillow and mattress encasements (can be found at stores such as [employer] or online) -avoid direct exposure to air condition flow -use a HEPA filter device, especially in the bedroom; you can also use a HEPA filter vacuum cleaner -wipe dust with a moist towel instead of a dry towel or broom when cleaning  Decrease mites and/or their secretions -wash clothing and linen and stuffed animals at highest temperature possible, at least every 2 weeks -stuffed animals can also be placed in a bag and put in a freezer overnight  Despite the above measures, it is impossible to eliminate dust mites or their allergen completely from your home.  With the above measures the burden of mites in your home can be diminished, with the goal of minimizing your allergic symptoms.  Success will be reached only when implementing and using all means together.

## 2021-07-21 ENCOUNTER — Other Ambulatory Visit: Payer: Self-pay | Admitting: Internal Medicine

## 2021-07-21 DIAGNOSIS — R238 Other skin changes: Secondary | ICD-10-CM

## 2021-07-23 ENCOUNTER — Encounter (INDEPENDENT_AMBULATORY_CARE_PROVIDER_SITE_OTHER): Payer: Self-pay

## 2021-07-23 ENCOUNTER — Ambulatory Visit: Payer: BC Managed Care – PPO | Admitting: Clinical

## 2021-07-23 ENCOUNTER — Ambulatory Visit (INDEPENDENT_AMBULATORY_CARE_PROVIDER_SITE_OTHER): Payer: BC Managed Care – PPO | Admitting: Physician Assistant

## 2021-07-23 NOTE — BH Specialist Note (Signed)
Pt came on video and needed to reschedule due to a work conflict.

## 2021-07-23 NOTE — Telephone Encounter (Signed)
Rx 06/29/21 #30 1RF- should be on file Requested Prescriptions  Pending Prescriptions Disp Refills   loratadine (CLARITIN) 10 MG tablet [Pharmacy Med Name: LORATADINE 10 MG TABLET] 30 tablet 1    Sig: TAKE 1 TABLET BY MOUTH EVERY DAY     Ear, Nose, and Throat:  Antihistamines 2 Passed - 07/21/2021  2:33 PM      Passed - Cr in normal range and within 360 days    Creatinine, Ser  Date Value Ref Range Status  05/07/2021 0.77 0.57 - 1.00 mg/dL Final         Passed - Valid encounter within last 12 months    Recent Outpatient Visits          3 weeks ago Scalp irritation   [facility] Marcine Matar, MD   4 months ago Essential hypertension   [facility] Marcine Matar, MD   9 months ago Essential hypertension   [facility] Marcine Matar, MD   1 year ago Obesity (BMI 30.0-34.9)    [facility] Marcine Matar, MD   1 year ago Need for immunization against influenza   [facility] Lois Huxley, Cornelius Moras, RPH-CPP      Future Appointments            In 2 weeks [place] Charlcie Cradle, MD [facility]   In 3 weeks Nehemiah Settle, FNP [facility]

## 2021-08-01 ENCOUNTER — Ambulatory Visit (HOSPITAL_BASED_OUTPATIENT_CLINIC_OR_DEPARTMENT_OTHER): Payer: BC Managed Care – PPO | Admitting: Clinical

## 2021-08-01 DIAGNOSIS — F331 Major depressive disorder, recurrent, moderate: Secondary | ICD-10-CM

## 2021-08-02 NOTE — BH Specialist Note (Signed)
Integrated Behavioral Health via Telemedicine Visit ? ?08/01/2021 ?Brittany Elliott ?[id number] ? ?Number of Integrated Behavioral Health Clinician visits: 5-Fifth Visit ? ?Session Start time: 1345 ?  ?Session End time: 1411 ? ?Total time in minutes: 26 ? ? ?Referring Provider: Jonah Blue, MD ?Patient/Family location: Work ?[facility] Provider location: [facility] Office ?All persons participating in visit: Pt and LCSW ?Types of Service: Individual psychotherapy and Video visit ? ?I connected with Brittany Elliott via Video Enabled Telemedicine Application  (Video is Caregility application) and verified that I am speaking with the correct person using two identifiers. Discussed confidentiality: Yes  ? ?I discussed the limitations of telemedicine and the availability of in person appointments.  Discussed there is a possibility of technology failure and discussed alternative modes of communication if that failure occurs. ? ?I discussed that engaging in this telemedicine visit, they consent to the provision of behavioral healthcare and the services will be billed under their insurance. ? ?Patient and/or legal guardian expressed understanding and consented to Telemedicine visit: Yes  ? ?Presenting Concerns: ?Patient and/or family reports the following symptoms/concerns: Reports feeling down at times as she continues to miss her ex-boyfriend. Reports that she learned new information about her ex's death. Reports that she continues to process this new information. Reports that she has been establishing boundaries with friends/family. Reports that there are still questions that she wants to ask her ex.  ?Duration of problem: 6 months; Severity of problem: moderate ? ?Patient and/or Family's Strengths/Protective Factors: ?Social and Emotional competence and Concrete supports in place (healthy food, safe environments, etc.) ? ?Goals Addressed: ?Patient will: ? Reduce symptoms of: depression  ? Increase knowledge and/or ability  of: coping skills  ? Demonstrate ability to: Begin healthy grieving over loss ? ?Progress towards Goals: ?Ongoing ? ?Interventions: ?Interventions utilized:  Copywriter, advertising, Mining engineer, CBT Cognitive Behavioral Therapy, and Supportive Counseling ?Standardized Assessments completed: Not Needed ? ?Patient and/or Family Response: Pt receptive to tx. Pt receptive to psychoeducation provided on grief. Pt receptive to assistance with cognitive processing skills related to her ex-boyfriend's death. Pt receptive to grief journaling as she continues to establish boundaries with family/friends. ? ?Assessment: ?Denies SI/HI. Patient currently experiencing depression and anxiety related to the grief of her ex-boyfriend. Pt continues to have difficulty processing his death as she has learned new information. Pt has been improving her boundaries with family/friends in order to spend more time to herself.  ? ?Patient may benefit from grief journaling. LCSW provided psychoeducation on grief. LCSW provided assistance with cognitive processing skills. LCSW encouraged pt to continue establishing healthy boundaries with family/friends. LCSW provided journal prompts for pt to utilize. LCSW will fu with pt. ? ?Plan: ?Follow up with behavioral health clinician on : 08/27/21 ?Behavioral recommendations: Utilize grief journaling and continue establishing healthy boundaries with family/friends. ?Referral(s): Integrated [employer] (In Clinic) ? ?I discussed the assessment and treatment plan with the patient and/or parent/guardian. They were provided an opportunity to ask questions and all were answered. They agreed with the plan and demonstrated an understanding of the instructions. ?  ?They were advised to call back or seek an in-person evaluation if the symptoms worsen or if the condition fails to improve as anticipated. ? ?Brittany Elliott C Brittany Schwan, LCSW ?

## 2021-08-06 NOTE — Progress Notes (Unsigned)
Established Patient Office Visit  Subjective:  Patient ID: Brittany Elliott, female    DOB: 08/11/1985  Age: 36 y.o. MRN: [id number]  CC: No chief complaint on file.   HPI Brittany Elliott presents for  Brittany Elliott  Alopecia  Past Medical History:  Diagnosis Date   Angio-edema    High blood pressure    Hx of metrorrhagia    Obese    Urticaria    Vitamin D deficiency     Past Surgical History:  Procedure Laterality Date   INDUCED ABORTION      Family History  Problem Relation Age of Onset   Hypertension Mother    Diabetes Mother    Kidney disease Mother    Heart disease Mother    High blood pressure Father     Social History   Socioeconomic History   Marital status: Single    Spouse name: Not on file   Number of children: Not on file   Years of education: Not on file   Highest education level: Not on file  Occupational History   Occupation: Teacher  Tobacco Use   Smoking status: Never   Smokeless tobacco: Never  Vaping Use   Vaping Use: Never used  Substance and Sexual Activity   Alcohol use: Yes    Comment: occasional   Drug use: No   Sexual activity: Yes  Other Topics Concern   Not on file  Social History Narrative   Not on file   Social Determinants of Health   Financial Resource Strain: Not on file  Food Insecurity: Not on file  Transportation Needs: Not on file  Physical Activity: Not on file  Stress: Not on file  Social Connections: Not on file  Intimate Partner Violence: Not on file    Outpatient Medications Prior to Visit  Medication Sig Dispense Refill   Cholecalciferol (VITAMIN D3) 50 MCG (2000 UT) capsule Take 1 capsule (2,000 Units total) by mouth daily. 30 capsule 0   fluocinolone (SYNALAR) 0.01 % external solution Apply small quarter size amount to scalp daily as needed for scalp irritation. 60 mL 0   hydrochlorothiazide (HYDRODIURIL) 25 MG tablet TAKE 1 TABLET (25 MG TOTAL) BY MOUTH DAILY. 90 tablet 1   hydrOXYzine  (ATARAX) 25 MG tablet Take 1 tablet (25 mg total) by mouth every 6 (six) hours. 12 tablet 0   Insulin Pen Needle (BD PEN NEEDLE NANO U/F) 32G X 4 MM MISC Use Nano needle with Ozempic 100 each 0   Liraglutide -Weight Management ([name]) 18 MG/3ML SOPN INJECT 2.4 MG UNDER THE SKIN ONCE DAILY Strength: 18 MG/3ML 15 mL 0   loratadine (CLARITIN) 10 MG tablet Take 1 tablet (10 mg total) by mouth daily. (Patient not taking: Reported on 07/18/2021) 30 tablet 1   NUVARING 0.12-0.015 MG/24HR vaginal ring Place 1 each vaginally every 21 ( twenty-one) days.     polyethylene glycol powder (GLYCOLAX/MIRALAX) 17 GM/SCOOP powder Take 17 g by mouth 2 (two) times daily as needed. 3350 g 1   No facility-administered medications prior to visit.    No Known Allergies  ROS Review of Systems    Objective:    Physical Exam  There were no vitals taken for this visit. Wt Readings from Last 3 Encounters:  07/18/21 248 lb 3.2 oz (112.6 kg)  07/09/21 243 lb (110.2 kg)  06/30/21 238 lb 1.6 oz (108 kg)     There are no preventive care reminders to display for this patient.  There are no preventive care reminders to display for this patient.  Lab Results  Component Value Date   TSH 1.640 03/10/2020   Lab Results  Component Value Date   WBC 8.4 11/13/2015   HGB 12.0 11/13/2015   HCT 36.4 11/13/2015   MCV 88.3 11/13/2015   PLT 314 11/13/2015   Lab Results  Component Value Date   NA 142 05/07/2021   K 3.9 05/07/2021   CO2 25 05/07/2021   GLUCOSE 89 05/07/2021   BUN 13 05/07/2021   CREATININE 0.77 05/07/2021   BILITOT 0.4 05/07/2021   ALKPHOS 44 05/07/2021   AST 7 05/07/2021   ALT 11 05/07/2021   PROT 6.7 05/07/2021   ALBUMIN 4.0 05/07/2021   CALCIUM 9.5 05/07/2021   ANIONGAP 7 04/21/2018   EGFR 104 05/07/2021   Lab Results  Component Value Date   CHOL 141 05/07/2021   Lab Results  Component Value Date   HDL 58 05/07/2021   Lab Results  Component Value Date   LDLCALC 72 05/07/2021    Lab Results  Component Value Date   TRIG 47 05/07/2021   No results found for: CHOLHDL Lab Results  Component Value Date   HGBA1C 5.4 05/07/2021      Assessment & Plan:   Problem List Items Addressed This Visit   None   No orders of the defined types were placed in this encounter.   Follow-up: No follow-ups on file.    Asencion Noble, MD

## 2021-08-07 ENCOUNTER — Other Ambulatory Visit: Payer: Self-pay

## 2021-08-07 ENCOUNTER — Encounter: Payer: Self-pay | Admitting: Critical Care Medicine

## 2021-08-07 ENCOUNTER — Ambulatory Visit: Payer: BC Managed Care – PPO | Attending: Critical Care Medicine | Admitting: Critical Care Medicine

## 2021-08-07 VITALS — BP 132/87 | HR 78 | Wt 249.2 lb

## 2021-08-07 DIAGNOSIS — L659 Nonscarring hair loss, unspecified: Secondary | ICD-10-CM

## 2021-08-07 MED ORDER — HYDROXYZINE HCL 25 MG PO TABS: 25.0000 mg | ORAL_TABLET | Freq: Four times a day (QID) | ORAL | 0 refills | Status: AC

## 2021-08-07 NOTE — Assessment & Plan Note (Signed)
Alopecia which cannot be borne out by the physical exam I am unclear what dermatology is determined I do not have access to those notes ? ?I will check iron levels thyroid function vitamin B12 vitamin D levels today I gave her a lifestyle medicine handout and asked her to follow-up on this with regards to changing her diet this may help in some regards with her weight loss polycystic ovary disease needs to be a consideration she is going to follow-up with her primary care provider ?

## 2021-08-07 NOTE — Patient Instructions (Signed)
Start vitamin D 50, 000 units weekly ? ?Lab screening for hair loss sent today ? ?No change in medications, hydroxyzine sent refills ? ?Follow lifestyle diet recommendations , see handout, and follow up with weight management ? ?Keep primary care follow up ? ? ?

## 2021-08-07 NOTE — Progress Notes (Signed)
Pt requested full panel blood test as well. ?

## 2021-08-08 ENCOUNTER — Telehealth: Payer: Self-pay

## 2021-08-08 LAB — THYROID PANEL WITH TSH
Free Thyroxine Index: 2.6 (ref 1.2–4.9)
T3 Uptake Ratio: 37 % (ref 24–39)
T4, Total: 7.1 ug/dL (ref 4.5–12.0)
TSH: 1.64 u[IU]/mL (ref 0.450–4.500)

## 2021-08-08 LAB — IRON,TIBC AND FERRITIN PANEL
Ferritin: 94 ng/mL (ref 15–150)
Iron Saturation: 20 % (ref 15–55)
Iron: 58 ug/dL (ref 27–159)
Total Iron Binding Capacity: 291 ug/dL (ref 250–450)
UIBC: 233 ug/dL (ref 131–425)

## 2021-08-08 LAB — VITAMIN D 25 HYDROXY (VIT D DEFICIENCY, FRACTURES): Vit D, 25-Hydroxy: 55.6 ng/mL (ref 30.0–100.0)

## 2021-08-08 LAB — VITAMIN B12: Vitamin B-12: 481 pg/mL (ref 232–1245)

## 2021-08-08 NOTE — Telephone Encounter (Signed)
Pt was called and is aware of results, DOB was confirmed.  ?

## 2021-08-08 NOTE — Telephone Encounter (Signed)
-----   Message from Storm Frisk, MD sent at 08/08/2021 12:49 PM EDT ----- ?Let pt know thyroid normal, vit D normal iron levels normal  b12 normal ? ?No specific cause for hair loss discerned   ??f/u with dermatology??  Adding Dr Laural Benes  ?

## 2021-08-09 ENCOUNTER — Encounter (INDEPENDENT_AMBULATORY_CARE_PROVIDER_SITE_OTHER): Payer: Self-pay

## 2021-08-09 ENCOUNTER — Ambulatory Visit (INDEPENDENT_AMBULATORY_CARE_PROVIDER_SITE_OTHER): Payer: BC Managed Care – PPO | Admitting: Family Medicine

## 2021-08-13 ENCOUNTER — Encounter: Payer: Self-pay | Admitting: Family

## 2021-08-13 ENCOUNTER — Other Ambulatory Visit: Payer: Self-pay

## 2021-08-13 ENCOUNTER — Ambulatory Visit: Payer: BC Managed Care – PPO | Admitting: Family

## 2021-08-13 ENCOUNTER — Ambulatory Visit: Payer: BC Managed Care – PPO | Admitting: Allergy

## 2021-08-13 VITALS — BP 124/82 | HR 106 | Temp 98.2°F | Resp 16 | Ht 69.0 in | Wt 251.6 lb

## 2021-08-13 DIAGNOSIS — R21 Rash and other nonspecific skin eruption: Secondary | ICD-10-CM

## 2021-08-13 NOTE — Progress Notes (Signed)
Follow-up Note ? ?RE: Mayara Paulson Dorrance MRN: [id number] DOB: Nov 08, 1985 ?Date of Office Visit: 08/13/2021 ? ?Primary care provider: Marcine Matar, MD ?Referring provider: Marcine Matar, MD ? ? ?Rusti returns to the office today for the patch test placement, given suspected history of contact dermatitis.  She reports that she does not have the hair extensions that she thinks because problems. ? ? ?Diagnostics: True Test patches placed.  ? ? ?Plan:  ? ?Allergic contact dermatitis ?- Instructions provided on care of the patches for the next 48 hours. ?- Dinita was instructed to avoid showering for the next 48 hours. ?- Mickey will follow up in 48 hours and 96 hours for patch readings. ?True Test looks for the following sensitivities:  ?  ? ? ?Nehemiah Settle, FNP ?[facility] ?

## 2021-08-13 NOTE — Patient Instructions (Signed)
True Test looks for the following sensitivities:     

## 2021-08-14 NOTE — Progress Notes (Signed)
? ?  Follow Up Note ? ?RE: Brittany Elliott MRN: [id number] DOB: 02/16/1986 ?Date of Office Visit: 08/15/2021 ? ?Referring provider: Ladell Pier, MD ?Primary care provider: Ladell Pier, MD ? ?History of Present Illness: ?I had the pleasure of seeing Brittany Elliott for a follow up visit at the [facility] on 08/15/2021. She is a 36 y.o. female, who is being followed for scalp rash and concern for possible contact dermatitis from hair products. Today she is here for initial patch test interpretation, given suspected history of contact dermatitis.  ? ?She has seen Derm since initial visit with differential of allergic contact vs seborrheic derm vs early manifestations of hair loss disorders.  ? ?Diagnostics:  ?TRUE TEST 48 hour reading:  ? T.R.U.E. Test - 08/15/21 1610   ? ? Time Antigen Placed [id number]   ? Manufacturer Lavella Hammock   ? Lot # [id number]   ? Location Back   ? Number of Test 36   ? Reading Interval Day 3   ? Panel Panel 1;Panel 2;Panel 3   ? 1. Nickel Sulfate 2   ? 2. Wool Alcohols 0   ? 3. Neomycin Sulfate --   +/-  ? 4. Potassium Dichromate 0   ? 5. Caine Mix 0   ? 6. Fragrance Mix 0   ? 7. Colophony 0   ? 8. Paraben Mix 0   ? 9. Negative Control 0   ? 10. Balsam of [place] 0   ? 11. Ethylenediamine Dihydrochloride 0   ? 12. Cobalt Dichloride 0   ? 13. p-tert Butylphenol Formaldehyde Resin 0   ? 14. Epoxy Resin 0   ? 15. Carba Mix 0   ? 16.  Black Rubber Mix 0   ? 17. Cl+ Me-Isothiazolinone 0   ? 18. Quaternium-15 0   ? 19. Methyldibromo Glutaronitrile 0   ? 20. p-Phenylenediamine --   ?  ? 21. Formaldehyde 0   ? 22. Mercapto Mix 0   ? 23. Thimerosal 0   ? 24. Thiuram Mix 0   ? 25. Diazolidinyl Urea 0   ? 26. Quinoline Mix 0   ? 27. Tixocortol-21-Pivalate 0   ? 28. Gold Sodium Thiosulfate 1   ? 29. Imidazolidinyl Urea 0   ? 30. Budesonide 0   ? 31. Hydrocortisone-17-Butyrate 0   ? 32. Mercaptobenzothiazole 0   ? 33. Bacitracin 0   ? 34. Parthenolide 0   ? 35. Disperse Blue 106 0   ? 36.  2-Bromo-2-Nitropropane-1,3-diol 0   ? ?  ?  ? ?  ? ?  ?Assessment and Plan: ?Brittany Elliott is a 36 y.o. female with: ?Concern for Contact Dermatitis: ? ?The patient has been provided detailed information regarding the substances she is sensitive to, as well as products containing the substances.  Meticulous avoidance of these substances is recommended. If avoidance is not possible, the use of barrier creams or lotions is recommended. ?If symptoms persist or progress despite meticulous avoidance of chemicals/substances above, dermatology evaluation may be warranted. ? ? ?It was my pleasure to see Brittany Elliott today and participate in her care. Please feel free to contact me with any questions or concerns. ? ?Sincerely, ? ? ?Sigurd Sos, MD ?[facility] ? ?

## 2021-08-15 ENCOUNTER — Other Ambulatory Visit: Payer: Self-pay

## 2021-08-15 ENCOUNTER — Encounter: Payer: Self-pay | Admitting: Internal Medicine

## 2021-08-15 ENCOUNTER — Ambulatory Visit: Payer: BC Managed Care – PPO | Admitting: Internal Medicine

## 2021-08-15 DIAGNOSIS — L239 Allergic contact dermatitis, unspecified cause: Secondary | ICD-10-CM

## 2021-08-16 NOTE — Progress Notes (Signed)
? ? ?  Follow-up Note ? ?RE: Brittany Elliott MRN: [id number] DOB: 05/31/1985 ?Date of Office Visit: 08/17/2021 ? ?Primary care provider: Marcine Matar, MD ?Referring provider: Marcine Matar, MD ? ? ?Jenni returns to the office today for the final patch test interpretation, given suspected history of contact dermatitis.  ? ? ?Diagnostics:  ? ?TRUE TEST 96-hour hour reading:  positive reaction to #1 (Nickel Sulfate) and positive reaction to #23 (Thiomersal) ? ? ?Plan:  ? ?Allergic contact dermatitis ?- The patient has been provided detailed information regarding the substances she is sensitive to, as well as products containing the substances.   ?- Meticulous avoidance of these substances is recommended.  ?- If avoidance is not possible, the use of barrier creams or lotions is recommended. ?- If symptoms persist or progress despite meticulous avoidance of the substances as listed above, a dermatology referral may be warranted. ?- I will send a list of safe products to your e-mail ? ?Call the clinic if this treatment plan is not working well for you ? ?Follow up in 6 months or sooner if needed. ? ?Thank you for the opportunity to care for this patient.  Please do not hesitate to contact me with questions. ? ?Thermon Leyland, FNP ?[facility] ?[facility] ?  ?

## 2021-08-17 ENCOUNTER — Other Ambulatory Visit: Payer: Self-pay

## 2021-08-17 ENCOUNTER — Encounter: Payer: Self-pay | Admitting: Family Medicine

## 2021-08-17 ENCOUNTER — Ambulatory Visit: Payer: BC Managed Care – PPO | Admitting: Family Medicine

## 2021-08-17 DIAGNOSIS — L235 Allergic contact dermatitis due to other chemical products: Secondary | ICD-10-CM

## 2021-08-17 NOTE — Patient Instructions (Addendum)
Diagnostics:  ? ?TRUE TEST 96-hour hour reading: positive reaction to #1 (Nickel Sulfate) and positive reaction to #23 (Thiomersal) ? ? ?Plan:  ? ?Allergic contact dermatitis ?- The patient has been provided detailed information regarding the substances she is sensitive to, as well as products containing the substances.   ?- Meticulous avoidance of these substances is recommended.  ?- If avoidance is not possible, the use of barrier creams or lotions is recommended. ?- If symptoms persist or progress despite meticulous avoidance of the substances as listed above, a dermatology referral may be warranted. ?- I will send a list of safe products to your e-mail ? ?Call the clinic if this treatment plan is not working well for you ? ?Follow up in 6 months or sooner if needed. ? ?

## 2021-08-27 ENCOUNTER — Ambulatory Visit (HOSPITAL_BASED_OUTPATIENT_CLINIC_OR_DEPARTMENT_OTHER): Payer: BC Managed Care – PPO | Admitting: Clinical

## 2021-08-27 DIAGNOSIS — F331 Major depressive disorder, recurrent, moderate: Secondary | ICD-10-CM

## 2021-09-03 ENCOUNTER — Ambulatory Visit (INDEPENDENT_AMBULATORY_CARE_PROVIDER_SITE_OTHER): Payer: BC Managed Care – PPO | Admitting: Family Medicine

## 2021-09-03 NOTE — BH Specialist Note (Signed)
Integrated Behavioral Health via Telemedicine Visit ? ?08/27/2021 ?Brittany Living Elliott ?[id number] ? ?Number of Integrated Behavioral Health Clinician visits: 6-Sixth Visit ? ?Session Start time: 1340 ?  ?Session End time: 1421 ? ?Total time in minutes: 41 ? ? ?Referring Provider: Jonah Blue, MD ?Patient/Family location: Work ?[facility] Provider location: [facility] office ?All persons participating in visit: Pt and LCSW ?Types of Service: Individual psychotherapy ? ?I connected with Brittany Elliott via Video Enabled Telemedicine Application  (Video is Caregility application) and verified that I am speaking with the correct person using two identifiers. Discussed confidentiality: Yes  ? ?I discussed the limitations of telemedicine and the availability of in person appointments.  Discussed there is a possibility of technology failure and discussed alternative modes of communication if that failure occurs. ? ?I discussed that engaging in this telemedicine visit, they consent to the provision of behavioral healthcare and the services will be billed under their insurance. ? ?Patient and/or legal guardian expressed understanding and consented to Telemedicine visit: Yes  ? ?Presenting Concerns: ?Patient and/or family reports the following symptoms/concerns: Reports feeling down at times. Reports that she is realizing that she is going to need to set boundaries with people in her life. Reports stressors related to her ex-boyfriend's birthday celebration.  ?Duration of problem: 7 months; Severity of problem: moderate ? ?Patient and/or Family's Strengths/Protective Factors: ?Social and Emotional competence and Concrete supports in place (healthy food, safe environments, etc.) ? ?Goals Addressed: ?Patient will: ? Reduce symptoms of: depression  ? Increase knowledge and/or ability of: coping skills  ? Demonstrate ability to: Begin healthy grieving over loss ? ?Progress towards Goals: ?Ongoing ? ?Interventions: ?Interventions  utilized:  CBT Cognitive Behavioral Therapy and Supportive Counseling ?Standardized Assessments completed: Not Needed ? ?Patient and/or Family Response: Pt receptive to tx. Pt receptive to psychoeducation provided on grief. Pt receptive to cognitive restructuring. Pt receptive to assistance with establishing healthy boundaries.  ? ?Assessment: ?Denies SI/HI. Patient currently experiencing depression related to the death of her ex-boyfriend. Pt appears to be overwhelmed with helping others to cope with her ex boyfriend's death. Pt has difficulty with establishing boundaries. LCSW also processed her departure from the clinic with pt. ? ?Patient may benefit from establishing boundaries and grief journaling. LCSW provided psychoeducatiion on grief. LCSW utilized Chartered certified accountant. LCSW encouraged pt to establish healthy boundaries and utilize grief journaling. LCSW will provide pt with therapists whom accept pt's insurance. ? ?Plan: ?Follow up with behavioral health clinician on : 09/17/21 ?Behavioral recommendations: Utilize grief journaling and establish healthy boundaries with friends/family. ?Referral(s): Integrated [employer] (In Clinic) and Counselor ? ?I discussed the assessment and treatment plan with the patient and/or parent/guardian. They were provided an opportunity to ask questions and all were answered. They agreed with the plan and demonstrated an understanding of the instructions. ?  ?They were advised to call back or seek an in-person evaluation if the symptoms worsen or if the condition fails to improve as anticipated. ? ?Patrecia Veiga C Luddie Boghosian, LCSW ?

## 2021-09-05 ENCOUNTER — Other Ambulatory Visit: Payer: Self-pay | Admitting: Specialist

## 2021-09-05 DIAGNOSIS — R519 Headache, unspecified: Secondary | ICD-10-CM

## 2021-09-15 ENCOUNTER — Ambulatory Visit
Admission: RE | Admit: 2021-09-15 | Discharge: 2021-09-15 | Disposition: A | Discharge status: Home / Self Care | Payer: BC Managed Care – PPO | Source: Ambulatory Visit | Attending: Specialist | Admitting: Specialist

## 2021-09-15 ENCOUNTER — Other Ambulatory Visit: Payer: BC Managed Care – PPO

## 2021-09-15 DIAGNOSIS — R519 Headache, unspecified: Secondary | ICD-10-CM

## 2021-09-17 ENCOUNTER — Encounter: Payer: BC Managed Care – PPO | Admitting: Clinical

## 2021-09-17 ENCOUNTER — Telehealth: Payer: Self-pay | Admitting: Clinical

## 2021-09-17 NOTE — Telephone Encounter (Signed)
I sent pt video link for virtual appt, pt did not join. I also attempted to call pt however her phone did not ring. ?

## 2021-09-25 ENCOUNTER — Ambulatory Visit (INDEPENDENT_AMBULATORY_CARE_PROVIDER_SITE_OTHER): Payer: BC Managed Care – PPO | Admitting: Family Medicine

## 2021-11-29 ENCOUNTER — Other Ambulatory Visit: Payer: Self-pay | Admitting: Internal Medicine

## 2021-11-29 DIAGNOSIS — I10 Essential (primary) hypertension: Secondary | ICD-10-CM

## 2021-11-29 NOTE — Telephone Encounter (Signed)
Requested medication (s) are due for refill today:   Yes  Requested medication (s) are on the active medication list:   Yes  Future visit scheduled:   No   Last ordered: 06/04/2021 #90, 1 refill  Returned because labs are due per protocol   Requested Prescriptions  Pending Prescriptions Disp Refills   hydrochlorothiazide (HYDRODIURIL) 25 MG tablet [Pharmacy Med Name: HYDROCHLOROTHIAZIDE 25 MG TAB] 90 tablet 1    Sig: TAKE 1 TABLET (25 MG TOTAL) BY MOUTH DAILY.     Cardiovascular: Diuretics - Thiazide Failed - 11/29/2021 12:16 AM      Failed - Cr in normal range and within 180 days    Creatinine, Ser  Date Value Ref Range Status  05/07/2021 0.77 0.57 - 1.00 mg/dL Final         Failed - K in normal range and within 180 days    Potassium  Date Value Ref Range Status  05/07/2021 3.9 3.5 - 5.2 mmol/L Final         Failed - Na in normal range and within 180 days    Sodium  Date Value Ref Range Status  05/07/2021 142 134 - 144 mmol/L Final         Passed - Last BP in normal range    BP Readings from Last 1 Encounters:  08/13/21 124/82         Passed - Valid encounter within last 6 months    Recent Outpatient Visits           3 months ago Alopecia   [facility] Storm Frisk, MD   5 months ago Scalp irritation   [facility] Marcine Matar, MD   8 months ago Essential hypertension   [facility] Marcine Matar, MD   1 year ago Essential hypertension   [facility] Marcine Matar, MD   1 year ago Obesity (BMI 30.0-34.9)   [facility] Marcine Matar, MD

## 2022-01-02 ENCOUNTER — Encounter (INDEPENDENT_AMBULATORY_CARE_PROVIDER_SITE_OTHER): Payer: Self-pay

## 2022-01-07 DIAGNOSIS — Z0289 Encounter for other administrative examinations: Secondary | ICD-10-CM

## 2022-01-09 ENCOUNTER — Ambulatory Visit (INDEPENDENT_AMBULATORY_CARE_PROVIDER_SITE_OTHER): Payer: BC Managed Care – PPO | Admitting: Nurse Practitioner

## 2022-01-09 ENCOUNTER — Encounter (INDEPENDENT_AMBULATORY_CARE_PROVIDER_SITE_OTHER): Payer: Self-pay | Admitting: Nurse Practitioner

## 2022-01-09 VITALS — BP 128/79 | HR 74 | Temp 98.1°F | Ht 69.0 in | Wt 266.0 lb

## 2022-01-09 DIAGNOSIS — Z6839 Body mass index (BMI) 39.0-39.9, adult: Secondary | ICD-10-CM

## 2022-01-09 DIAGNOSIS — E559 Vitamin D deficiency, unspecified: Secondary | ICD-10-CM

## 2022-01-09 DIAGNOSIS — R0602 Shortness of breath: Secondary | ICD-10-CM

## 2022-01-09 DIAGNOSIS — E669 Obesity, unspecified: Secondary | ICD-10-CM | POA: Diagnosis not present

## 2022-01-09 DIAGNOSIS — Z6832 Body mass index (BMI) 32.0-32.9, adult: Secondary | ICD-10-CM

## 2022-01-17 NOTE — Progress Notes (Signed)
Chief Complaint:   OBESITY Brittany Elliott is here to discuss her progress with her obesity treatment plan along with follow-up of her obesity related diagnoses. Brittany Elliott is on the Category 3 Plan and states she is following her eating plan approximately 0% of the time. Brittany Elliott states she is going to the gym 30 minutes 4 times per week.  Today's visit was #: 25 Starting weight: 245 lbs Starting date: 03/27/2020 Today's weight: 266 lbs Today's date: 01/09/2022 Total lbs lost to date: 0 lbs Total lbs lost since last in-office visit: 0  Interim History: Brittany Elliott was seen here last on 07/09/21. She has tried [name], Topamax and stopped due to side effects. Her highest weight was 303 lbs. She is eating 3 meals. Denies snacking. Her father lives with her, "when he cooks he expects me to eat everything, if I don't he gets mad". Breakfast is eggs, lunch is eating out since out of school. Dinner is protein, veggies and carbs. She is an Geophysicist/field seismologist principal. She feels going back to work will help her get back on a schedule. Drinking water daily.   Subjective:   1. Vitamin D deficiency Brittany Elliott is taking a multivitamin over the counter. She has taken Vit D 50,000 IU weekly in the past. Her last Vit D level of 55.6.  2. SOB (shortness of breath) on exertion Brittany Elliott's last IC on 03/27/20 was 2252. Today's IC was 2174.  Assessment/Plan:   1. Vitamin D deficiency Brittany Elliott is to take over the counter Vit D 2,000 IU daily.  2. SOB (shortness of breath) on exertion Reviewed IC today.  Worsened.   3. Obesity with current BMI 39.4 Brittany Elliott is currently in the action stage of change. As such, her goal is to continue with weight loss efforts. She has agreed to the Category 3 Plan.   Brittany Elliott is to restart [name] 0.6 mg. Will recheck labs in 1-2 months.  Exercise goals: As is.  Behavioral modification strategies: increasing lean protein intake, increasing water intake, and planning for  success.  Brittany Elliott has agreed to follow-up with our clinic in 2 weeks. She was informed of the importance of frequent follow-up visits to maximize her success with intensive lifestyle modifications for her multiple health conditions.   Objective:   Blood pressure 128/79, pulse 74, temperature 98.1 F (36.7 C), height 5\' 9"  (1.753 m), weight 266 lb (120.7 kg), SpO2 98 %. Body mass index is 39.28 kg/m.  General: Cooperative, alert, well developed, in no acute distress. HEENT: Conjunctivae and lids unremarkable. Cardiovascular: Regular rhythm.  Lungs: Normal work of breathing. Neurologic: No focal deficits.   Lab Results  Component Value Date   CREATININE 0.77 05/07/2021   BUN 13 05/07/2021   NA 142 05/07/2021   K 3.9 05/07/2021   CL 105 05/07/2021   CO2 25 05/07/2021   Lab Results  Component Value Date   ALT 11 05/07/2021   AST 7 05/07/2021   ALKPHOS 44 05/07/2021   BILITOT 0.4 05/07/2021   Lab Results  Component Value Date   HGBA1C 5.4 05/07/2021   HGBA1C 5.4 01/11/2021   HGBA1C 5.5 03/27/2020   Lab Results  Component Value Date   INSULIN 18.8 05/07/2021   INSULIN 14.2 01/11/2021   INSULIN 13.2 03/27/2020   Lab Results  Component Value Date   TSH 1.640 08/07/2021   Lab Results  Component Value Date   CHOL 141 05/07/2021   HDL 58 05/07/2021   LDLCALC 72 05/07/2021   TRIG 47 05/07/2021  Lab Results  Component Value Date   VD25OH 55.6 08/07/2021   VD25OH 86.0 05/07/2021   VD25OH 80.8 01/11/2021   Lab Results  Component Value Date   WBC 8.4 11/13/2015   HGB 12.0 11/13/2015   HCT 36.4 11/13/2015   MCV 88.3 11/13/2015   PLT 314 11/13/2015   Lab Results  Component Value Date   IRON 58 08/07/2021   TIBC 291 08/07/2021   FERRITIN 94 08/07/2021   Attestation Statements:   Reviewed by clinician on day of visit: allergies, medications, problem list, medical history, surgical history, family history, social history, and previous encounter notes.  I  spent 30 minutes with the patient and with reviewing the chart prior and after the visit.    I, Brittany Elliott, RMA, am acting as transcriptionist for Brittany Limbo, FNP.  I have reviewed the above documentation for accuracy and completeness, and I agree with the above. Brittany Limbo, FNP

## 2022-01-24 ENCOUNTER — Ambulatory Visit (INDEPENDENT_AMBULATORY_CARE_PROVIDER_SITE_OTHER): Payer: BC Managed Care – PPO | Admitting: Family Medicine

## 2022-01-24 ENCOUNTER — Encounter (INDEPENDENT_AMBULATORY_CARE_PROVIDER_SITE_OTHER): Payer: Self-pay

## 2022-01-24 ENCOUNTER — Encounter (INDEPENDENT_AMBULATORY_CARE_PROVIDER_SITE_OTHER): Payer: Self-pay | Admitting: Family Medicine

## 2022-01-24 VITALS — BP 114/79 | HR 87 | Temp 98.5°F | Ht 69.0 in | Wt 262.0 lb

## 2022-01-24 DIAGNOSIS — E8881 Metabolic syndrome: Secondary | ICD-10-CM

## 2022-01-24 DIAGNOSIS — E669 Obesity, unspecified: Secondary | ICD-10-CM | POA: Diagnosis not present

## 2022-01-24 DIAGNOSIS — Z6835 Body mass index (BMI) 35.0-35.9, adult: Secondary | ICD-10-CM | POA: Diagnosis not present

## 2022-01-24 MED ORDER — SAXENDA 18 MG/3ML ~~LOC~~ SOPN: PEN_INJECTOR | SUBCUTANEOUS | 0 refills | Status: DC

## 2022-02-03 NOTE — Progress Notes (Unsigned)
Chief Complaint:   OBESITY Brittany Elliott is here to discuss her progress with her obesity treatment plan along with follow-up of her obesity related diagnoses. Brittany Elliott is on the Category 3 Plan and states she is following her eating plan approximately 40% of the time. Brittany Elliott states she is doing circuit training 40 minutes 4 times per week.  Today's visit was #: 26 Starting weight: 245 lbs Starting date: 03/27/2020 Today's weight: 262 lbs Today's date: 01/24/2022 Total lbs lost to date: 0 Total lbs lost since last in-office visit: 4 lbs  Interim History: restarted AWOL workouts at 5 AM.  Doing better with breakfast and lunches on category 3 meal plan, but is eating fast food for dinners due to her work schedule.  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  Started [name], increased 0.6 mg to 1.2 mg for the past 3 days, better satiety.  Denies any GI side effects.   Subjective:   1. Insulin resistance Fasting insulin increased 18.8, 05/07/2021.  Working on decrease of intake of sugar.  Assessment/Plan:   1. Insulin resistance Recheck insulin levels in 1-2 months.   2. Obesity with current BMI 35.87 Refill - Liraglutide -Weight Management ([name]) 18 MG/3ML SOPN; INJECT 2.4 MG UNDER THE SKIN ONCE DAILY Strength: 18 MG/3ML  Dispense: 15 mL; Refill: 0  Brittany Elliott is currently in the action stage of change. As such, her goal is to continue with weight loss efforts. She has agreed to the Category 3 Plan.   Exercise goals:  As is,   Behavioral modification strategies: increasing lean protein intake, increasing water intake, decreasing eating out, no skipping meals, meal planning and cooking strategies, keeping healthy foods in the home, better snacking choices, and decreasing junk food.  Brittany Elliott has agreed to follow-up with our clinic in 3 weeks. She was informed of the importance of frequent follow-up visits to maximize her success with intensive  lifestyle modifications for her multiple health conditions.   Objective:   Blood pressure 114/79, pulse 87, temperature 98.5 F (36.9 C), height 5\' 9"  (1.753 m), weight 262 lb (118.8 kg), SpO2 99 %. Body mass index is 38.69 kg/m.  General: Cooperative, alert, well developed, in no acute distress. HEENT: Conjunctivae and lids unremarkable. Cardiovascular: Regular rhythm.  Lungs: Normal work of breathing. Neurologic: No focal deficits.   Lab Results  Component Value Date   CREATININE 0.77 05/07/2021   BUN 13 05/07/2021   NA 142 05/07/2021   K 3.9 05/07/2021   CL 105 05/07/2021   CO2 25 05/07/2021   Lab Results  Component Value Date   ALT 11 05/07/2021   AST 7 05/07/2021   ALKPHOS 44 05/07/2021   BILITOT 0.4 05/07/2021   Lab Results  Component Value Date   HGBA1C 5.4 05/07/2021   HGBA1C 5.4 01/11/2021   HGBA1C 5.5 03/27/2020   Lab Results  Component Value Date   INSULIN 18.8 05/07/2021   INSULIN 14.2 01/11/2021   INSULIN 13.2 03/27/2020   Lab Results  Component Value Date   TSH 1.640 08/07/2021   Lab Results  Component Value Date   CHOL 141 05/07/2021   HDL 58 05/07/2021   LDLCALC 72 05/07/2021   TRIG 47 05/07/2021   Lab Results  Component Value Date   VD25OH 55.6 08/07/2021   VD25OH 86.0 05/07/2021   VD25OH 80.8 01/11/2021   Lab Results  Component Value Date   WBC 8.4 11/13/2015   HGB 12.0 11/13/2015   HCT 36.4 11/13/2015  MCV 88.3 11/13/2015   PLT 314 11/13/2015   Lab Results  Component Value Date   IRON 58 08/07/2021   TIBC 291 08/07/2021   FERRITIN 94 08/07/2021   Attestation Statements:   Reviewed by clinician on day of visit: allergies, medications, problem list, medical history, surgical history, family history, social history, and previous encounter notes.  Time spent on visit including pre-visit chart review and post-visit care and charting was 30 minutes.   I, Malcolm Metro, am acting as Energy manager for Seymour Bars, DO.  I  have reviewed the above documentation for accuracy and completeness, and I agree with the above. Glennis Brink, DO

## 2022-02-18 ENCOUNTER — Ambulatory Visit: Payer: BC Managed Care – PPO | Admitting: Nurse Practitioner

## 2022-02-18 VITALS — BP 135/91 | HR 89 | Temp 98.6°F | Ht 69.0 in | Wt 259.0 lb

## 2022-02-18 DIAGNOSIS — E669 Obesity, unspecified: Secondary | ICD-10-CM | POA: Diagnosis not present

## 2022-02-18 DIAGNOSIS — Z6838 Body mass index (BMI) 38.0-38.9, adult: Secondary | ICD-10-CM

## 2022-02-18 DIAGNOSIS — E559 Vitamin D deficiency, unspecified: Secondary | ICD-10-CM | POA: Diagnosis not present

## 2022-02-18 MED ORDER — VITAMIN D (ERGOCALCIFEROL) 1.25 MG (50000 UNIT) PO CAPS: 50000.0000 [IU] | ORAL_CAPSULE | ORAL | 0 refills | Status: DC

## 2022-02-18 MED ORDER — SAXENDA 18 MG/3ML ~~LOC~~ SOPN: PEN_INJECTOR | SUBCUTANEOUS | 0 refills | Status: DC

## 2022-02-20 NOTE — Progress Notes (Unsigned)
Chief Complaint:   OBESITY Brittany Elliott is here to discuss her progress with her obesity treatment plan along with follow-up of her obesity related diagnoses. Brittany Elliott is on the Category 3 Plan and states she is following her eating plan approximately 60% of the time. Brittany Elliott states she is exercising 0 minutes 0 times per week.  Today's visit was #: 27 Starting weight: 245 lbs Starting date: 03/27/2020 Today's weight: 259 lbs Today's date: 02/18/2022 Total lbs lost to date: 0 lbs Total lbs lost since last in-office visit: 3  Interim History: Brittany Elliott is taking [name] 3 mg (increased dose last week). Denies any side effects. She has been busy starting a new job. Skipping lunch. Celebrating her birthday this weekend. Drinking water rarely a soda. She averaging around 9,000-10,000 steps per day since starting her new job. She is craving chocolate and brownies.    Subjective:   1. Vitamin D deficiency Brittany Elliott is currently taking prescription Vit D 50,000 IU once a week.   Assessment/Plan:   1. Vitamin D deficiency We will refill Vit D 50,000 IU once a week for 1 month with 0 refills. Low Vitamin D level contributes to fatigue and are associated with obesity, breast, and colon cancer. She agrees to continue to take prescription Vitamin D @50 ,000 IU every week and will follow-up for routine testing of Vitamin D, at least 2-3 times per year to avoid over-replacement.   -Refill Vitamin D, Ergocalciferol, (DRISDOL) 1.25 MG (50000 UNIT) CAPS capsule; Take 1 capsule (50,000 Units total) by mouth once a week.  Dispense: 5 capsule; Refill: 0  2. Obesity with current BMI 38.3 We will refill [name] 2.4mg  daily for 1 month with 0 refills.  -Refill Liraglutide -Weight Management ([name]) 18 MG/3ML SOPN; INJECT 2.4 MG UNDER THE SKIN ONCE DAILY Strength: 18 MG/3ML  Dispense: 15 mL; Refill: 0  Brittany Elliott is currently in the action stage of change. As such, her goal is to continue with weight loss  efforts. She has agreed to the Category 3 Plan.   Exercise goals: All adults should avoid inactivity. Some physical activity is better than none, and adults who participate in any amount of physical activity gain some health benefits.  Will continue with [name] until next visit, will consider Wegovy 1.7 mg. We will obtain labs at next visit.  Behavioral modification strategies: increasing lean protein intake, increasing vegetables, increasing water intake, and planning for success.  Brittany Elliott has agreed to follow-up with our clinic in 2 weeks. She was informed of the importance of frequent follow-up visits to maximize her success with intensive lifestyle modifications for her multiple health conditions.   Objective:   Blood pressure (!) 135/91, pulse 89, temperature 98.6 F (37 C), height 5\' 9"  (1.753 m), weight 259 lb (117.5 kg), SpO2 98 %. Body mass index is 38.25 kg/m.  General: Cooperative, alert, well developed, in no acute distress. HEENT: Conjunctivae and lids unremarkable. Cardiovascular: Regular rhythm.  Lungs: Normal work of breathing. Neurologic: No focal deficits.   Lab Results  Component Value Date   CREATININE 0.77 05/07/2021   BUN 13 05/07/2021   NA 142 05/07/2021   K 3.9 05/07/2021   CL 105 05/07/2021   CO2 25 05/07/2021   Lab Results  Component Value Date   ALT 11 05/07/2021   AST 7 05/07/2021   ALKPHOS 44 05/07/2021   BILITOT 0.4 05/07/2021   Lab Results  Component Value Date   HGBA1C 5.4 05/07/2021   HGBA1C 5.4 01/11/2021   HGBA1C  5.5 03/27/2020   Lab Results  Component Value Date   INSULIN 18.8 05/07/2021   INSULIN 14.2 01/11/2021   INSULIN 13.2 03/27/2020   Lab Results  Component Value Date   TSH 1.640 08/07/2021   Lab Results  Component Value Date   CHOL 141 05/07/2021   HDL 58 05/07/2021   LDLCALC 72 05/07/2021   TRIG 47 05/07/2021   Lab Results  Component Value Date   VD25OH 55.6 08/07/2021   VD25OH 86.0 05/07/2021   VD25OH  80.8 01/11/2021   Lab Results  Component Value Date   WBC 8.4 11/13/2015   HGB 12.0 11/13/2015   HCT 36.4 11/13/2015   MCV 88.3 11/13/2015   PLT 314 11/13/2015   Lab Results  Component Value Date   IRON 58 08/07/2021   TIBC 291 08/07/2021   FERRITIN 94 08/07/2021   Attestation Statements:   Reviewed by clinician on day of visit: allergies, medications, problem list, medical history, surgical history, family history, social history, and previous encounter notes.  I, Brendell Tyus, RMA, am acting as transcriptionist for Everardo Pacific, FNP.  I have reviewed the above documentation for accuracy and completeness, and I agree with the above. -  ***

## 2022-02-21 ENCOUNTER — Encounter: Payer: Self-pay | Admitting: Internal Medicine

## 2022-03-11 ENCOUNTER — Encounter: Payer: Self-pay | Admitting: Nurse Practitioner

## 2022-03-11 ENCOUNTER — Ambulatory Visit: Payer: BC Managed Care – PPO | Admitting: Nurse Practitioner

## 2022-03-11 VITALS — BP 158/94 | HR 88 | Temp 99.1°F | Ht 69.0 in | Wt 262.0 lb

## 2022-03-11 DIAGNOSIS — Z6838 Body mass index (BMI) 38.0-38.9, adult: Secondary | ICD-10-CM | POA: Diagnosis not present

## 2022-03-11 DIAGNOSIS — I10 Essential (primary) hypertension: Secondary | ICD-10-CM | POA: Diagnosis not present

## 2022-03-11 DIAGNOSIS — Z6836 Body mass index (BMI) 36.0-36.9, adult: Secondary | ICD-10-CM

## 2022-03-11 DIAGNOSIS — E669 Obesity, unspecified: Secondary | ICD-10-CM

## 2022-03-11 DIAGNOSIS — E559 Vitamin D deficiency, unspecified: Secondary | ICD-10-CM

## 2022-03-11 MED ORDER — VITAMIN D (ERGOCALCIFEROL) 1.25 MG (50000 UNIT) PO CAPS: 50000.0000 [IU] | ORAL_CAPSULE | ORAL | 0 refills | Status: DC

## 2022-03-11 MED ORDER — WEGOVY 1.7 MG/0.75ML ~~LOC~~ SOAJ: 1.7000 mg | SUBCUTANEOUS | 0 refills | Status: DC

## 2022-03-12 ENCOUNTER — Encounter (INDEPENDENT_AMBULATORY_CARE_PROVIDER_SITE_OTHER): Payer: Self-pay

## 2022-03-12 NOTE — Progress Notes (Unsigned)
Chief Complaint:   OBESITY Brittany Elliott is here to discuss her progress with her obesity treatment plan along with follow-up of her obesity related diagnoses. Brittany Elliott is on the Category 3 Plan and states she is following her eating plan approximately 30% of the time. Brittany Elliott states she is exercising 0 minutes 0 times per week.  Today's visit was #: 28 Starting weight: 245 lbs Starting date: 03/27/2020 Today's weight: 262 lbs Today's date: 03/11/2022 Total lbs lost to date: 0 lbs Total lbs lost since last in-office visit: 0  Interim History: Brittany Elliott has had a lot going on since her last visit. Her cousin passed away at the age of 93. His service is today. Denies stress eating. Taking [name] 3mg , every 1-2 days. Denies side effects. Denies polyphagia or cravings.   Subjective:   1. Essential hypertension Brittany Elliott's blood pressure is elevated today. She has not taken medications today. Denies any chest pain,shortness of breath or palpitations.  2. Vitamin D deficiency Brittany Elliott is currently taking prescription Vit D 50,000 IU once a week.  Denies side effects.  Denies nausea, vomiting or muscle weakness.  Assessment/Plan:   1. Essential hypertension Take medications as directed and follow up with PCP.  2. Vitamin D deficiency We will refill Vit D 50,000 IU once a week for 1 month with 0 refills.  Low Vitamin D level contributes to fatigue and are associated with obesity, breast, and colon cancer. She agrees to continue to take prescription Vitamin D @50 ,000 IU every week and will follow-up for routine testing of Vitamin D, at least 2-3 times per year to avoid over-replacement.   -Refill Vitamin D, Ergocalciferol, (DRISDOL) 1.25 MG (50000 UNIT) CAPS capsule; Take 1 capsule (50,000 Units total) by mouth once a week.  Dispense: 5 capsule; Refill: 0  3. Obesity with current BMI 38.3 STOP [name] START Wegovy 1.7 mg once weekly. Side effects discussed.  Contraindications:   Patient denies:  Pancreatitis (active gallstones) Medullary thyroid cancer High triglycerides (>500)-will need labs prior to starting Multiple Endocrine Neoplasia syndrome type 2 (MEN 2) Trying to get pregnant Breastfeeding Use with caution with taking insulin or sulfonylureas (will need to monitor blood sugars for hypoglycemia)  -Start Semaglutide-Weight Management (WEGOVY) 1.7 MG/0.75ML SOAJ; Inject 1.7 mg into the skin once a week.  Dispense: 3 mL; Refill: 0  Brittany Elliott is currently in the action stage of change. As such, her goal is to continue with weight loss efforts. She has agreed to the Category 3 Plan.   Labs obtained at next visit.  Exercise goals: All adults should avoid inactivity. Some physical activity is better than none, and adults who participate in any amount of physical activity gain some health benefits.  Behavioral modification strategies: increasing lean protein intake, increasing water intake, and planning for success.  Brittany Elliott has agreed to follow-up with our clinic in 3 weeks. She was informed of the importance of frequent follow-up visits to maximize her success with intensive lifestyle modifications for her multiple health conditions.   Objective:   Blood pressure (!) 158/94, pulse 88, temperature 99.1 F (37.3 C), temperature source Oral, height 5\' 9"  (1.753 m), weight 262 lb (118.8 kg), SpO2 99 %. Body mass index is 38.69 kg/m.  General: Cooperative, alert, well developed, in no acute distress. HEENT: Conjunctivae and lids unremarkable. Cardiovascular: Regular rhythm.  Lungs: Normal work of breathing. Neurologic: No focal deficits.   Lab Results  Component Value Date   CREATININE 0.77 05/07/2021   BUN 13 05/07/2021  NA 142 05/07/2021   K 3.9 05/07/2021   CL 105 05/07/2021   CO2 25 05/07/2021   Lab Results  Component Value Date   ALT 11 05/07/2021   AST 7 05/07/2021   ALKPHOS 44 05/07/2021   BILITOT 0.4 05/07/2021   Lab Results   Component Value Date   HGBA1C 5.4 05/07/2021   HGBA1C 5.4 01/11/2021   HGBA1C 5.5 03/27/2020   Lab Results  Component Value Date   INSULIN 18.8 05/07/2021   INSULIN 14.2 01/11/2021   INSULIN 13.2 03/27/2020   Lab Results  Component Value Date   TSH 1.640 08/07/2021   Lab Results  Component Value Date   CHOL 141 05/07/2021   HDL 58 05/07/2021   LDLCALC 72 05/07/2021   TRIG 47 05/07/2021   Lab Results  Component Value Date   VD25OH 55.6 08/07/2021   VD25OH 86.0 05/07/2021   VD25OH 80.8 01/11/2021   Lab Results  Component Value Date   WBC 8.4 11/13/2015   HGB 12.0 11/13/2015   HCT 36.4 11/13/2015   MCV 88.3 11/13/2015   PLT 314 11/13/2015   Lab Results  Component Value Date   IRON 58 08/07/2021   TIBC 291 08/07/2021   FERRITIN 94 08/07/2021   Attestation Statements:   Reviewed by clinician on day of visit: allergies, medications, problem list, medical history, surgical history, family history, social history, and previous encounter notes.  I, Brendell Tyus, RMA, am acting as transcriptionist for Irene Limbo, FNP.  I have reviewed the above documentation for accuracy and completeness, and I agree with the above. Irene Limbo, FNP

## 2022-03-14 NOTE — Patient Instructions (Signed)
What is a GLP-1 Glucagon like peptide-1 (GLP-1) agonists represent a class of medications used to treat type 2 diabetes mellitus and obesity.  GLP-1 medications mimic the action of a hormone called glucagon like peptide 1.  When blood sugar levels start to rise/increase these drugs stimulate the body to produce more insulin.  When that happens, the extra insulin helps to lower the blood sugar levels in the body.  This in returns helps with decreasing cravings.  These medications also slow the movement of food from the stomach into the small intestine.  This in return helps one to full faster and longer.   Diabetic medications: Approved for treatment of diabetes mellitus but does not have full approval for weight loss use Victoza (liraglutide) Ozempic (semaglutide) Mounjaro Trulicity Rybelsus  Weight loss medications: Approved for long-term weight loss use.        [name] (liraglutide) Wegovy (semaglutide) Contraindications:  Pancreatitis (active gallstones) Medullary thyroid cancer High triglycerides (>500)-will need labs prior to starting Multiple Endocrine Neoplasia syndrome type 2 (MEN 2) Trying to get pregnant Breastfeeding Use with caution with taking insulin or sulfonylureas (will need to monitor blood sugars for hypoglycemia) Side effects (most common): Most common side effects are nausea, gas, bloating and constipation.  Other possible side effects are headaches, belching, diarrhea, tiredness (fatigue), vomiting, upset stomach, dizziness, heartburn and stomach (abdominal pain).  If you think that you are becoming dehydrated, please inform our office or your primary family provider.  Stop immediately and go to ER if you have any symptoms of a serious allergic reaction including swelling of your face, lips, tongue or throat; problems breathing or swallowing; severe rash or itching; fainting or feeling dizzy; or very rapid heart rate.                                                                                            Steps to starting your [facility]  The office staff will send a prior authorization request to your insurance company for approval. We will send you a mychart message once we hear back from your insurance with a decision.  This can take up to 7-10 business days.   Once your WegovyTis approved, you may then pick up [facility] pen from your pharmacy.    Learn how to do Wegovy injections on the [email or web address] website. There is a training video that will walk you through how to safely perform the injection. If you have questions for our clinical staff, please contact our  clinical staff. If you have any symptoms of allergic reaction to [facility] discontinue immediately and call 911.  1. What should I tell my provider before using WegovyT ? have or have had problems with your pancreas or kidneys. have type 2 diabetes and a history of diabetic retinopathy. have or have had depression, suicidal thoughts, or mental health issues. are pregnant or plan to become pregnant. Joesphine Bare may harm your unborn baby. You should stop using WegovyT 3 months before you plan to become pregnant or if you are breastfeeding or plan to breastfeed. It is not known if WegovyT passes into your breast milk.  2. What is RWERXVQ  and how does it work?  Ferd Glassing is an injectable prescription medication prescribed by your provider to help with your weight loss.  This medicine will be most effective when combined with a reduced calorie diet and physical activity.  Ferd Glassing is not for the treatment of type 2 diabetes mellitus. Ferd Glassing should not be used with other GLP-1 receptor agonist medicines. The addition of WegovyT in  patients treated with insulin has not been evaluated. When initiating WegovyT, consider reducing the dose of concomitantly administered insulin secretagogues (such as sulfonylureas) or insulin to reduce the risk of  hypoglycemia.  One role of GLP-1 is to send a signal to your brain to  tell it you are full. It also slows down stomach emptying which will make you feel full longer and may help with reducing cravings.   3.  How should I take WegovyT?  Administer WegovyT once weekly, on the same day each week, at any time of day, with or without meals Inject subcutaneously in the abdomen, thigh or upper arm Initiate at 0.25 mg once weekly for 4 weeks. In 4 week intervals, increase the dose until a dose of 2.4 mg is reached (we will discuss with you the dosage at each visit). The maintenance dose of WegovyT is 2.4 mg once weekly.  The dosing schedule of Wegovy is:  0.25 mg per week X 4 weeks 0.5 mg per week X 4 weeks 1.0 mg per week X 4 weeks 1.7 mg per week X 4 weeks 2.4 mg per week   Missed dose   If you miss your injection day, go ahead inject your current dose. You can go >7 days, but not <7 days between injections. You may change your injection day (It must be >7 days). If you miss >2 doses, you can still keep next injection dose the same or follow de-escalation schedule which may minimize GI symptoms.   In patients with type 2 diabetes, monitor blood glucose prior to starting and during WEGOVYT treatment.   Inject your dose of Wegovy under the skin (subcutaneous injection) in your stomach area (abdomen), upper leg (thigh) or upper arm. Do not inject into a vein or a muscle. The injection site should be rotated and not given in the same spot each day. Hold the needle under the skin and count to "10". This will allow all of the medicine to be dispensed under the skin. Always wipe your skin with an alcohol prep pad before injection  Dispose of used pen in an approved sharps container. More practical options that can be put in the trash  to go to the landfill are milk jugs or plastic laundry detergent containers with a screw on lid.  What side effects may I notice from taking WegovyT?  Side effects that usually do not require medical attention (report to our office if  they continue or are bothersome): Nausea (most common but decreases over time in most people as their body gets used to the medicine) Diarrhea Constipation (you may take an over the counter laxative if needed) Headache Decreased appetite Upset stomach Tiredness Dizziness Feeling bloated Hair loss Belching Gas Heartburn  Side effects that you should call 911 as soon as possible Vomiting Stomach pain Fever Yellowing of your skin or eyes  Clay-colored stools Increased heart rate while at rest Low blood sugar  Sudden changes in mood, behaviors, thoughts, feelings, or thoughts of suicide If you get a lump or swelling in your neck, hoarseness, trouble swallowing, or shortness of breath.  Allergic reaction such as skin rash, itching, hives, swelling of the face, tongue, or lips  Helpful tips for managing nausea Nausea is a common side effect when first starting WegovyT. If you experience nausea, be sure to connect with your health care provider. He or she will offer guidance on ways to manage it, which may include: Eat bland, low-fat foods, like crackers, toast and rice  Eat foods that contain water, like soups and gelatin  Avoid lying down after you eat  Go outdoors for fresh air  Eat more slowly    Other important information Do not drop your pen or knock it against hard surfaces  Do not expose your pen to any liquids  If you think that your pen may be damaged, do not try to fix it. Use a new one Keep the pen cap on until you are ready to inject. Your pen will no longer be sterile if you store an unused pen without the cap, if you pull the pen cap off and put it on again, or if the pen cap is missing. This could lead to an infection  Store the [place] pen in the refrigerator from 74F to 84F (2C to 8C) If needed, before removing the pen cap, WegovyT can be stored from 8C to 30C (84F to 37F) in the original carton for up to 28 days.  Keep WegovyT in the original carton to  protect it from light  Do not freeze  Throw away pen if WegovyT has been frozen, has been exposed to light or temperatures above 37F (30C), or has been out of the refrigerator for 28 days or longer It's important to properly dispose of your used WegovyT pens. Do not throw the pen away in your household trash. Instead, use an FDA-cleared sharps disposable container or a sturdy household container with a tight-fitting lid, like a heavy duty plastic container.   DJMEQA pen training website: [employer]  Wegovy savings and support link: achegone.com

## 2022-04-01 ENCOUNTER — Ambulatory Visit: Payer: BC Managed Care – PPO | Admitting: Nurse Practitioner

## 2022-04-01 ENCOUNTER — Encounter: Payer: Self-pay | Admitting: Nurse Practitioner

## 2022-04-01 VITALS — BP 137/99 | HR 88 | Temp 98.6°F | Ht 69.0 in | Wt 248.0 lb

## 2022-04-01 DIAGNOSIS — E669 Obesity, unspecified: Secondary | ICD-10-CM | POA: Diagnosis not present

## 2022-04-01 DIAGNOSIS — I10 Essential (primary) hypertension: Secondary | ICD-10-CM

## 2022-04-01 DIAGNOSIS — Z6836 Body mass index (BMI) 36.0-36.9, adult: Secondary | ICD-10-CM | POA: Diagnosis not present

## 2022-04-01 MED ORDER — WEGOVY 1.7 MG/0.75ML ~~LOC~~ SOAJ: 1.7000 mg | SUBCUTANEOUS | 0 refills | Status: DC

## 2022-04-04 NOTE — Progress Notes (Signed)
Chief Complaint:   OBESITY Brittany Elliott is here to discuss her progress with her obesity treatment plan along with follow-up of her obesity related diagnoses. Brittany Elliott is on the Category 3 Plan and states she is following her eating plan approximately 40% of the time. Brittany Elliott states she is exercising 0 minutes 0 times per week.  Today's visit was #: 27 Starting weight: 245 lbs Starting date: 03/27/2020 Today's weight: 248 lbs Today's date: 04/01/2022 Total lbs lost to date: 0 lbs Total lbs lost since last in-office visit: 14  Interim History: Brittany Elliott is taking Wegovy 1.7 mg. Denies any side effects. She has done well with weight loss since her last visit. Changed to [facility] after taking [name] and is doing well.She has been making better/healthier choices. Denies polyphagia or cravings. She is requesting  to obtain labs at next visit.   Subjective:   1. Essential hypertension Brittany Elliott's blood pressure looks better today but is still elevated. She has not taken any medications today. Denies any chest pain,shortness of breath or palpitations.  Assessment/Plan:   1. Essential hypertension Continue to follow up with PCP and medications as directed.  Brittany Elliott is working on healthy weight loss and exercise to improve blood pressure control. We will watch for signs of hypotension as she continues her lifestyle modifications.   2. Obesity with current BMI 36.7 Will obtain labs at next visit. We will refill Wegovy 1.7 mg once weekly for 1 month with 0 refills.  -Refill Semaglutide-Weight Management (WEGOVY) 1.7 MG/0.75ML SOAJ; Inject 1.7 mg into the skin once a week.  Dispense: 3 mL; Refill: 0  Brittany Elliott is currently in the action stage of change. As such, her goal is to continue with weight loss efforts. She has agreed to the Category 3 Plan.   Exercise goals: All adults should avoid inactivity. Some physical activity is better than none, and adults who participate in any amount of  physical activity gain some health benefits.  Behavioral modification strategies: increasing lean protein intake, decreasing simple carbohydrates, increasing water intake, and no skipping meals.  Brittany Elliott has agreed to follow-up with our clinic in 3 weeks. She was informed of the importance of frequent follow-up visits to maximize her success with intensive lifestyle modifications for her multiple health conditions.   Objective:   Blood pressure (!) 137/99, pulse 88, temperature 98.6 F (37 C), temperature source Oral, height 5\' 9"  (1.753 m), weight 248 lb (112.5 kg), SpO2 99 %. Body mass index is 36.62 kg/m.  General: Cooperative, alert, well developed, in no acute distress. HEENT: Conjunctivae and lids unremarkable. Cardiovascular: Regular rhythm.  Lungs: Normal work of breathing. Neurologic: No focal deficits.   Lab Results  Component Value Date   CREATININE 0.77 05/07/2021   BUN 13 05/07/2021   NA 142 05/07/2021   K 3.9 05/07/2021   CL 105 05/07/2021   CO2 25 05/07/2021   Lab Results  Component Value Date   ALT 11 05/07/2021   AST 7 05/07/2021   ALKPHOS 44 05/07/2021   BILITOT 0.4 05/07/2021   Lab Results  Component Value Date   HGBA1C 5.4 05/07/2021   HGBA1C 5.4 01/11/2021   HGBA1C 5.5 03/27/2020   Lab Results  Component Value Date   INSULIN 18.8 05/07/2021   INSULIN 14.2 01/11/2021   INSULIN 13.2 03/27/2020   Lab Results  Component Value Date   TSH 1.640 08/07/2021   Lab Results  Component Value Date   CHOL 141 05/07/2021   HDL 58 05/07/2021  LDLCALC 72 05/07/2021   TRIG 47 05/07/2021   Lab Results  Component Value Date   VD25OH 55.6 08/07/2021   VD25OH 86.0 05/07/2021   VD25OH 80.8 01/11/2021   Lab Results  Component Value Date   WBC 8.4 11/13/2015   HGB 12.0 11/13/2015   HCT 36.4 11/13/2015   MCV 88.3 11/13/2015   PLT 314 11/13/2015   Lab Results  Component Value Date   IRON 58 08/07/2021   TIBC 291 08/07/2021   FERRITIN 94  08/07/2021   Attestation Statements:   Reviewed by clinician on day of visit: allergies, medications, problem list, medical history, surgical history, family history, social history, and previous encounter notes.  I, Brendell Tyus, RMA, am acting as transcriptionist for Irene Limbo, FNP.  I have reviewed the above documentation for accuracy and completeness, and I agree with the above. Irene Limbo, FNP

## 2022-04-07 ENCOUNTER — Other Ambulatory Visit: Payer: Self-pay | Admitting: Nurse Practitioner

## 2022-04-24 ENCOUNTER — Ambulatory Visit: Payer: BC Managed Care – PPO | Admitting: Nurse Practitioner

## 2022-05-06 ENCOUNTER — Telehealth (INDEPENDENT_AMBULATORY_CARE_PROVIDER_SITE_OTHER): Payer: Self-pay | Admitting: Nurse Practitioner

## 2022-05-06 ENCOUNTER — Other Ambulatory Visit: Payer: Self-pay | Admitting: Nurse Practitioner

## 2022-05-06 NOTE — Telephone Encounter (Signed)
Pt call 12/11 need refill on Wegovy took last pill yesterday 12/10

## 2022-05-06 NOTE — Telephone Encounter (Signed)
Stephanie's pt-Pt has been sent a message through MyChart. Thx!

## 2022-05-08 ENCOUNTER — Ambulatory Visit: Payer: BC Managed Care – PPO | Admitting: Nurse Practitioner

## 2022-05-08 ENCOUNTER — Encounter: Payer: Self-pay | Admitting: Nurse Practitioner

## 2022-05-08 VITALS — BP 142/92 | HR 90 | Temp 98.3°F | Ht 69.0 in | Wt 242.0 lb

## 2022-05-08 DIAGNOSIS — R5383 Other fatigue: Secondary | ICD-10-CM | POA: Diagnosis not present

## 2022-05-08 DIAGNOSIS — E88819 Insulin resistance, unspecified: Secondary | ICD-10-CM | POA: Diagnosis not present

## 2022-05-08 DIAGNOSIS — E559 Vitamin D deficiency, unspecified: Secondary | ICD-10-CM | POA: Diagnosis not present

## 2022-05-08 DIAGNOSIS — E669 Obesity, unspecified: Secondary | ICD-10-CM

## 2022-05-08 DIAGNOSIS — R7989 Other specified abnormal findings of blood chemistry: Secondary | ICD-10-CM | POA: Diagnosis not present

## 2022-05-08 DIAGNOSIS — Z6835 Body mass index (BMI) 35.0-35.9, adult: Secondary | ICD-10-CM

## 2022-05-08 MED ORDER — VITAMIN D (ERGOCALCIFEROL) 1.25 MG (50000 UNIT) PO CAPS: 50000.0000 [IU] | ORAL_CAPSULE | ORAL | 0 refills | Status: DC

## 2022-05-08 MED ORDER — WEGOVY 1.7 MG/0.75ML ~~LOC~~ SOAJ: 1.7000 mg | SUBCUTANEOUS | 0 refills | Status: DC

## 2022-05-09 ENCOUNTER — Telehealth (INDEPENDENT_AMBULATORY_CARE_PROVIDER_SITE_OTHER): Payer: Self-pay | Admitting: Nurse Practitioner

## 2022-05-09 ENCOUNTER — Encounter: Payer: Self-pay | Admitting: Nurse Practitioner

## 2022-05-09 LAB — CBC WITH DIFFERENTIAL/PLATELET
Basophils Absolute: 0 10*3/uL (ref 0.0–0.2)
Basos: 1 %
EOS (ABSOLUTE): 0 10*3/uL (ref 0.0–0.4)
Eos: 1 %
Hematocrit: 36.2 % (ref 34.0–46.6)
Hemoglobin: 12.3 g/dL (ref 11.1–15.9)
Immature Grans (Abs): 0 10*3/uL (ref 0.0–0.1)
Immature Granulocytes: 0 %
Lymphocytes Absolute: 1.9 10*3/uL (ref 0.7–3.1)
Lymphs: 31 %
MCH: 30.5 pg (ref 26.6–33.0)
MCHC: 34 g/dL (ref 31.5–35.7)
MCV: 90 fL (ref 79–97)
Monocytes Absolute: 0.3 10*3/uL (ref 0.1–0.9)
Monocytes: 5 %
Neutrophils Absolute: 3.8 10*3/uL (ref 1.4–7.0)
Neutrophils: 62 %
Platelets: 336 10*3/uL (ref 150–450)
RBC: 4.03 x10E6/uL (ref 3.77–5.28)
RDW: 11.8 % (ref 11.7–15.4)
WBC: 6.2 10*3/uL (ref 3.4–10.8)

## 2022-05-09 LAB — COMPREHENSIVE METABOLIC PANEL
ALT: 11 IU/L (ref 0–32)
AST: 5 IU/L (ref 0–40)
Albumin/Globulin Ratio: 1.7 (ref 1.2–2.2)
Albumin: 4.3 g/dL (ref 3.9–4.9)
Alkaline Phosphatase: 59 IU/L (ref 44–121)
BUN/Creatinine Ratio: 10 (ref 9–23)
BUN: 9 mg/dL (ref 6–20)
Bilirubin Total: 0.6 mg/dL (ref 0.0–1.2)
CO2: 20 mmol/L (ref 20–29)
Calcium: 9.7 mg/dL (ref 8.7–10.2)
Chloride: 105 mmol/L (ref 96–106)
Creatinine, Ser: 0.9 mg/dL (ref 0.57–1.00)
Globulin, Total: 2.6 g/dL (ref 1.5–4.5)
Glucose: 87 mg/dL (ref 70–99)
Potassium: 4.5 mmol/L (ref 3.5–5.2)
Sodium: 138 mmol/L (ref 134–144)
Total Protein: 6.9 g/dL (ref 6.0–8.5)
eGFR: 85 mL/min/{1.73_m2} (ref >59)

## 2022-05-09 LAB — FERRITIN: Ferritin: 110 ng/mL (ref 15–150)

## 2022-05-09 LAB — IRON: Iron: 59 ug/dL (ref 27–159)

## 2022-05-09 LAB — VITAMIN B12: Vitamin B-12: 503 pg/mL (ref 232–1245)

## 2022-05-09 LAB — INSULIN, RANDOM: INSULIN: 12.7 u[IU]/mL (ref 2.6–24.9)

## 2022-05-09 LAB — HEMOGLOBIN A1C
Est. average glucose Bld gHb Est-mCnc: 105 mg/dL
Hgb A1c MFr Bld: 5.3 % (ref 4.8–5.6)

## 2022-05-09 LAB — VITAMIN D 25 HYDROXY (VIT D DEFICIENCY, FRACTURES): Vit D, 25-Hydroxy: 28.8 ng/mL — ABNORMAL LOW (ref 30.0–100.0)

## 2022-05-09 NOTE — Telephone Encounter (Signed)
Pt called very concerned over lab results. Pt wants to speak with Judeth Cornfield today. Please call pt at [phone]. Thank You!

## 2022-05-21 NOTE — Progress Notes (Unsigned)
Chief Complaint:   OBESITY Brittany Elliott is here to discuss her progress with her obesity treatment plan along with follow-up of her obesity related diagnoses. Brittany Elliott is on the Category 3 Plan and states she is following her eating plan approximately 40% of the time. Brittany Elliott states she is exercising 0 minutes 0 times per week.  Today's visit was #: 28 Starting weight: 245 lbs Starting date: 03/27/2020 Today's weight: 242 lbs Today's date: 05/08/2022 Total lbs lost to date: 3 lbs Total lbs lost since last in-office visit: 6  Interim History: Brittany Elliott has done well with weight loss. Eating 1-3 meals daily. Taking Wegovy 1.7 mg. Denies any side effects. Drinking water and a soda. She is walking 2-5 miles daily at work. She recently signed up for a gym membership.  Subjective:   1. Vitamin D deficiency Brittany Elliott is currently taking prescription Vit D 50,000 IU once a week. Denies any nausea, vomiting or muscle weakness.  2. Other fatigue Brittany Elliott is struggling with fatigue. History of anemia, Vit D def and low Vit B12.  3. Low vitamin B12 level Brittany Elliott is not taking Vit B12 or MV over the counter. Reports fatigue.  4. Insulin resistance Taking Wegovy 1.7 mg. Denies any side effects.  Assessment/Plan:   1. Vitamin D deficiency We will obtain labs today. We will refill Vit D 50K IU once a week for 1 month with 0 refills.  -Refill Vitamin D, Ergocalciferol, (DRISDOL) 1.25 MG (50000 UNIT) CAPS capsule; Take 1 capsule (50,000 Units total) by mouth once a week.  Dispense: 5 capsule; Refill: 0  - Comprehensive metabolic panel - VITAMIN D 25 Hydroxy (Vit-D Deficiency, Fractures)  2. Other fatigue We will obtain labs today.  - Comprehensive metabolic panel - CBC with Differential/Platelet - Vitamin B12 - Ferritin - Iron  3. Low vitamin B12 level We will obtain labs today.  - Comprehensive metabolic panel - Vitamin B12  4. Insulin resistance We will obtain labs today.  We will refill Wegovy 1.7 mg SQ once weekly for 1 month with 0 refills.  -Refill Semaglutide-Weight Management (WEGOVY) 1.7 MG/0.75ML SOAJ; Inject 1.7 mg into the skin once a week.  Dispense: 3 mL; Refill: 0  - Hemoglobin A1c - Insulin, random  5. Obesity with current BMI 35.7 Brittany Elliott is currently in the action stage of change. As such, her goal is to continue with weight loss efforts. She has agreed to the Category 3 Plan.   Exercise goals: All adults should avoid inactivity. Some physical activity is better than none, and adults who participate in any amount of physical activity gain some health benefits.  Behavioral modification strategies: increasing lean protein intake, increasing water intake, no skipping meals, and holiday eating strategies .  Brittany Elliott has agreed to follow-up with our clinic in 3 weeks. She was informed of the importance of frequent follow-up visits to maximize her success with intensive lifestyle modifications for her multiple health conditions.   Brittany Elliott was informed we would discuss her lab results at her next visit unless there is a critical issue that needs to be addressed sooner. Brittany Elliott agreed to keep her next visit at the agreed upon time to discuss these results.  Objective:   Blood pressure (!) 142/92, pulse 90, temperature 98.3 F (36.8 C), temperature source Oral, height 5\' 9"  (1.753 m), weight 242 lb (109.8 kg), SpO2 100 %. Body mass index is 35.74 kg/m.  General: Cooperative, alert, well developed, in no acute distress. HEENT: Conjunctivae and lids unremarkable. Cardiovascular: Regular  rhythm.  Lungs: Normal work of breathing. Neurologic: No focal deficits.   Lab Results  Component Value Date   CREATININE 0.90 05/08/2022   BUN 9 05/08/2022   NA 138 05/08/2022   K 4.5 05/08/2022   CL 105 05/08/2022   CO2 20 05/08/2022   Lab Results  Component Value Date   ALT 11 05/08/2022   AST 5 05/08/2022   ALKPHOS 59 05/08/2022   BILITOT 0.6  05/08/2022   Lab Results  Component Value Date   HGBA1C 5.3 05/08/2022   HGBA1C 5.4 05/07/2021   HGBA1C 5.4 01/11/2021   HGBA1C 5.5 03/27/2020   Lab Results  Component Value Date   INSULIN 12.7 05/08/2022   INSULIN 18.8 05/07/2021   INSULIN 14.2 01/11/2021   INSULIN 13.2 03/27/2020   Lab Results  Component Value Date   TSH 1.640 08/07/2021   Lab Results  Component Value Date   CHOL 141 05/07/2021   HDL 58 05/07/2021   LDLCALC 72 05/07/2021   TRIG 47 05/07/2021   Lab Results  Component Value Date   VD25OH 28.8 (L) 05/08/2022   VD25OH 55.6 08/07/2021   VD25OH 86.0 05/07/2021   Lab Results  Component Value Date   WBC 6.2 05/08/2022   HGB 12.3 05/08/2022   HCT 36.2 05/08/2022   MCV 90 05/08/2022   PLT 336 05/08/2022   Lab Results  Component Value Date   IRON 59 05/08/2022   TIBC 291 08/07/2021   FERRITIN 110 05/08/2022   Attestation Statements:   Reviewed by clinician on day of visit: allergies, medications, problem list, medical history, surgical history, family history, social history, and previous encounter notes.  I, Brendell Tyus, RMA, am acting as transcriptionist for Brittany Pacific, FNP.  I have reviewed the above documentation for accuracy and completeness, and I agree with the above. Brittany Pacific, FNP

## 2022-05-22 ENCOUNTER — Encounter: Payer: Self-pay | Admitting: Internal Medicine

## 2022-06-06 ENCOUNTER — Encounter: Payer: Self-pay | Admitting: Nurse Practitioner

## 2022-06-06 ENCOUNTER — Ambulatory Visit: Payer: BC Managed Care – PPO | Admitting: Nurse Practitioner

## 2022-06-06 VITALS — BP 122/86 | HR 104 | Temp 98.8°F | Ht 69.0 in | Wt 232.0 lb

## 2022-06-06 DIAGNOSIS — E88819 Insulin resistance, unspecified: Secondary | ICD-10-CM | POA: Diagnosis not present

## 2022-06-06 DIAGNOSIS — E559 Vitamin D deficiency, unspecified: Secondary | ICD-10-CM

## 2022-06-06 DIAGNOSIS — Z6834 Body mass index (BMI) 34.0-34.9, adult: Secondary | ICD-10-CM

## 2022-06-06 DIAGNOSIS — E669 Obesity, unspecified: Secondary | ICD-10-CM

## 2022-06-06 MED ORDER — WEGOVY 1.7 MG/0.75ML ~~LOC~~ SOAJ: 1.7000 mg | SUBCUTANEOUS | 0 refills | Status: DC

## 2022-06-12 NOTE — Progress Notes (Signed)
Chief Complaint:   OBESITY Brittany Elliott is here to discuss her progress with her obesity treatment plan along with follow-up of her obesity related diagnoses. Brittany Elliott is on the Category 3 Plan and states she is following her eating plan approximately 70% of the time. Brittany Elliott states she is exercising 0 minutes 0 times per week.  Today's visit was #: 58 Starting weight: 245 lbs Starting date: 03/27/2020 Today's weight: 232 lbs Today's date: 06/06/2022 Total lbs lost to date: 13 lbs Total lbs lost since last in-office visit: 10  Interim History: Brittany Elliott has been sick since her last visit.  She is taking Wegovy 1.7 mg. Denies any side effects.  She is not skipping meals and is drinking enough water.  Aiming to eat more protein.   Subjective:   1. Vitamin D deficiency Labs discussed during visit today.  Brittany Elliott restarted taking Vit D 50,000 IU weekly 2 week ago.  Denies any nausea, vomiting or muscle weakness.  2. Insulin resistance Brittany Elliott is taking Wegovy 1.7 mg.  Denies any side effects.  She has taken [name] in the past.  Assessment/Plan:   1. Vitamin D deficiency Continue taking Vit D 50,000 IU weekly.  Side effects discussed.  Will continue to monitor.  2. Insulin resistance We will refill Wegovy 1.7 mg SQ once weekly for 1 month with 0 refills.  Side effects discussed.   Brittany Elliott will continue to work on weight loss, exercise, and decreasing simple carbohydrates to help decrease the risk of diabetes. Brittany Elliott agreed to follow-up with [name] as directed to closely monitor her progress.   -Refill Semaglutide-Weight Management (WEGOVY) 1.7 MG/0.75ML SOAJ; Inject 1.7 mg into the skin once a week.  Dispense: 3 mL; Refill: 0  3. Obesity with current BMI 34.3 Brittany Elliott is currently in the action stage of change. As such, her goal is to continue with weight loss efforts. She has agreed to the Category 3 Plan.   Exercise goals: All adults should avoid inactivity. Some physical  activity is better than none, and adults who participate in any amount of physical activity gain some health benefits.  Labs reviewed in chart with patient today.  Behavioral modification strategies: increasing lean protein intake, increasing vegetables, increasing water intake, and meal planning and cooking strategies.  Brittany Elliott has agreed to follow-up with our clinic in 3 weeks. She was informed of the importance of frequent follow-up visits to maximize her success with intensive lifestyle modifications for her multiple health conditions.   Objective:   Blood pressure 122/86, pulse (!) 104, temperature 98.8 F (37.1 C), height 5\' 9"  (1.753 m), weight 232 lb (105.2 kg), SpO2 100 %. Body mass index is 34.26 kg/m.  General: Cooperative, alert, well developed, in no acute distress. HEENT: Conjunctivae and lids unremarkable. Cardiovascular: Regular rhythm.  Lungs: Normal work of breathing. Neurologic: No focal deficits.   Lab Results  Component Value Date   CREATININE 0.90 05/08/2022   BUN 9 05/08/2022   NA 138 05/08/2022   K 4.5 05/08/2022   CL 105 05/08/2022   CO2 20 05/08/2022   Lab Results  Component Value Date   ALT 11 05/08/2022   AST 5 05/08/2022   ALKPHOS 59 05/08/2022   BILITOT 0.6 05/08/2022   Lab Results  Component Value Date   HGBA1C 5.3 05/08/2022   HGBA1C 5.4 05/07/2021   HGBA1C 5.4 01/11/2021   HGBA1C 5.5 03/27/2020   Lab Results  Component Value Date   INSULIN 12.7 05/08/2022   INSULIN 18.8 05/07/2021  INSULIN 14.2 01/11/2021   INSULIN 13.2 03/27/2020   Lab Results  Component Value Date   TSH 1.640 08/07/2021   Lab Results  Component Value Date   CHOL 141 05/07/2021   HDL 58 05/07/2021   LDLCALC 72 05/07/2021   TRIG 47 05/07/2021   Lab Results  Component Value Date   VD25OH 28.8 (L) 05/08/2022   VD25OH 55.6 08/07/2021   VD25OH 86.0 05/07/2021   Lab Results  Component Value Date   WBC 6.2 05/08/2022   HGB 12.3 05/08/2022   HCT 36.2  05/08/2022   MCV 90 05/08/2022   PLT 336 05/08/2022   Lab Results  Component Value Date   IRON 59 05/08/2022   TIBC 291 08/07/2021   FERRITIN 110 05/08/2022   Attestation Statements:   Reviewed by clinician on day of visit: allergies, medications, problem list, medical history, surgical history, family history, social history, and previous encounter notes.  I, Brendell Tyus, RMA, am acting as transcriptionist for Everardo Pacific, FNP.  I have reviewed the above documentation for accuracy and completeness, and I agree with the above. Everardo Pacific, FNP

## 2022-07-04 ENCOUNTER — Ambulatory Visit: Payer: BC Managed Care – PPO | Admitting: Nurse Practitioner

## 2022-07-04 VITALS — BP 143/85 | HR 87 | Temp 98.2°F | Ht 69.0 in | Wt 229.0 lb

## 2022-07-04 DIAGNOSIS — E88819 Insulin resistance, unspecified: Secondary | ICD-10-CM | POA: Diagnosis not present

## 2022-07-04 DIAGNOSIS — Z6833 Body mass index (BMI) 33.0-33.9, adult: Secondary | ICD-10-CM | POA: Diagnosis not present

## 2022-07-04 DIAGNOSIS — E669 Obesity, unspecified: Secondary | ICD-10-CM | POA: Diagnosis not present

## 2022-07-04 DIAGNOSIS — E559 Vitamin D deficiency, unspecified: Secondary | ICD-10-CM

## 2022-07-04 MED ORDER — VITAMIN D (ERGOCALCIFEROL) 1.25 MG (50000 UNIT) PO CAPS: 50000.0000 [IU] | ORAL_CAPSULE | ORAL | 0 refills | Status: DC

## 2022-07-04 MED ORDER — WEGOVY 1.7 MG/0.75ML ~~LOC~~ SOAJ: 1.7000 mg | SUBCUTANEOUS | 0 refills | Status: DC

## 2022-07-04 NOTE — Progress Notes (Signed)
Office: [phone]  /  Fax: [phone]  WEIGHT SUMMARY AND BIOMETRICS  Medical Weight Loss Height: 5\' 9"  (1.753 m) Weight: 229 lb (103.9 kg) Temp: 98.2 F (36.8 C) Pulse Rate: 87 BP: (!) 143/85 SpO2: 100 % Today's Visit #: 30 Weight at Last VIsit: 232lb Weight Lost Since Last Visit: 3lb  Body Fat %: 41.4 % Fat Mass (lbs): 95 lbs Muscle Mass (lbs): 127.6 lbs Total Body Water (lbs): 95.2 lbs Visceral Fat Rating : 9 Starting Date: 03/27/20 Starting Weight: 245lb Total Weight Loss (lbs): 16 lb (7.258 kg)    HPI  Chief Complaint: OBESITY  Brittany Elliott is here to discuss her progress with her obesity treatment plan. She is on the the Category 3 Plan and states she is following her eating plan approximately 20 % of the time. She states she is exercising 0 minutes 0 times per week.   Interval History:  Since last office visit she done well with weight loss.  She has been eating more girl [employer] cookies for the past couple of weeks.  She has been off track.  Not making the healthiest choices.  She is drinking water but not enough.  Denies sugary drinks. Her highest weight was 298lbs.   Pharmacotherapy: She is taking Wegovy 1.7.  Denies side effects.    Current or previous pharmacotherapy: She has tried [name] and Topamax in the past for weight loss. She stopped Topamax due to daytime sleepiness.  Feels Mancel Parsons works better for her by helping reduce her appetite and cravings more.    PHYSICAL EXAM:  Blood pressure (!) 143/85, pulse 87, temperature 98.2 F (36.8 C), height 5\' 9"  (1.753 m), weight 229 lb (103.9 kg), last menstrual period 06/20/2022, SpO2 100 %. Body mass index is 33.82 kg/m.  General: She is overweight, cooperative, alert, well developed, and in no acute distress. PSYCH: Has normal mood, affect and thought process.   Extremities: No edema.  Neurologic: No gross sensory or motor deficits. No tremors or fasciculations noted.    DIAGNOSTIC DATA  REVIEWED:  BMET    Component Value Date/Time   NA 138 05/08/2022 0823   K 4.5 05/08/2022 0823   CL 105 05/08/2022 0823   CO2 20 05/08/2022 0823   GLUCOSE 87 05/08/2022 0823   GLUCOSE 95 04/21/2018 2111   BUN 9 05/08/2022 0823   CREATININE 0.90 05/08/2022 0823   CALCIUM 9.7 05/08/2022 0823   GFRNONAA 90 03/10/2020 1124   GFRAA 104 03/10/2020 1124   Lab Results  Component Value Date   HGBA1C 5.3 05/08/2022   HGBA1C 5.5 03/27/2020   Lab Results  Component Value Date   INSULIN 12.7 05/08/2022   INSULIN 13.2 03/27/2020   Lab Results  Component Value Date   TSH 1.640 08/07/2021   CBC    Component Value Date/Time   WBC 6.2 05/08/2022 0823   WBC 8.4 11/13/2015 1601   RBC 4.03 05/08/2022 0823   RBC 4.12 11/13/2015 1601   HGB 12.3 05/08/2022 0823   HCT 36.2 05/08/2022 0823   PLT 336 05/08/2022 0823   MCV 90 05/08/2022 0823   MCH 30.5 05/08/2022 0823   MCH 29.1 11/13/2015 1601   MCHC 34.0 05/08/2022 0823   MCHC 33.0 11/13/2015 1601   RDW 11.8 05/08/2022 0823   Iron Studies    Component Value Date/Time   IRON 59 05/08/2022 0823   TIBC 291 08/07/2021 1557   FERRITIN 110 05/08/2022 0823   IRONPCTSAT 20 08/07/2021 1557   Lipid Panel  Component Value Date/Time   CHOL 141 05/07/2021 0800   TRIG 47 05/07/2021 0800   HDL 58 05/07/2021 0800   LDLCALC 72 05/07/2021 0800   Hepatic Function Panel     Component Value Date/Time   PROT 6.9 05/08/2022 0823   ALBUMIN 4.3 05/08/2022 0823   AST 5 05/08/2022 0823   ALT 11 05/08/2022 0823   ALKPHOS 59 05/08/2022 0823   BILITOT 0.6 05/08/2022 0823      Component Value Date/Time   TSH 1.640 08/07/2021 1557   Nutritional Lab Results  Component Value Date   VD25OH 28.8 (L) 05/08/2022   VD25OH 55.6 08/07/2021   VD25OH 86.0 05/07/2021     ASSESSMENT AND PLAN  TREATMENT PLAN FOR OBESITY:  Recommended Dietary Goals  Amnah is currently in the action stage of change. As such, her goal is to continue weight  management plan. She has agreed to keeping a food journal and adhering to recommended goals of 1500 calories and 100 protein.  Behavioral Intervention  We discussed the following Behavioral Modification Strategies today: increasing lean protein intake, increasing vegetables, and increase water intake.  Additional resources provided today: NA  Recommended Physical Activity Goals  Addylin has been advised to work up to 150 minutes of moderate intensity aerobic activity a week and strengthening exercises 2-3 times per week for cardiovascular health, weight loss maintenance and preservation of muscle mass.   She has agreed to Patient also encouraged on scheduling and tracking physical activity.    Pharmacotherapy We discussed various medication options to help [facility] with her weight loss efforts and we both agreed to continued Wegovy 1.7mg .  ASSOCIATED CONDITIONS ADDRESSED TODAY  Vitamin D deficiency She is taking Vit D 50,000 IU weekly.  Denies side effects.  Denies nausea, vomiting or muscle weakness.   -     Vitamin D (Ergocalciferol); Take 1 capsule (50,000 Units total) by mouth once a week.  Dispense: 5 capsule; Refill: 0  Insulin resistance Doing well with Wegovy 1.7mg .  Denies side effects.  Denies polyphagia or cravings.  Mancel Parsons; Inject 1.7 mg into the skin once a week.  Dispense: 3 mL; Refill: 0  Generalized obesity  BMI 33.0-33.9,adult     Return in about 2 weeks (around 07/18/2022).Marland Kitchen She was informed of the importance of frequent follow up visits to maximize her success with intensive lifestyle modifications for her multiple health conditions.   ATTESTASTION STATEMENTS:  Reviewed by clinician on day of visit: allergies, medications, problem list, medical history, surgical history, family history, social history, and previous encounter notes.     Ailene Rud. Marelyn Rouser FNP-C

## 2022-07-15 ENCOUNTER — Ambulatory Visit: Payer: BC Managed Care – PPO | Admitting: Nurse Practitioner

## 2022-07-30 ENCOUNTER — Ambulatory Visit: Payer: BC Managed Care – PPO | Admitting: Nurse Practitioner

## 2022-08-01 ENCOUNTER — Encounter: Payer: Self-pay | Admitting: Nurse Practitioner

## 2022-08-01 ENCOUNTER — Ambulatory Visit: Payer: BC Managed Care – PPO | Admitting: Nurse Practitioner

## 2022-08-01 VITALS — BP 128/86 | HR 91 | Temp 98.6°F | Ht 69.0 in | Wt 226.0 lb

## 2022-08-01 DIAGNOSIS — Z6833 Body mass index (BMI) 33.0-33.9, adult: Secondary | ICD-10-CM | POA: Diagnosis not present

## 2022-08-01 DIAGNOSIS — E669 Obesity, unspecified: Secondary | ICD-10-CM | POA: Diagnosis not present

## 2022-08-01 DIAGNOSIS — E88819 Insulin resistance, unspecified: Secondary | ICD-10-CM

## 2022-08-01 MED ORDER — WEGOVY 1.7 MG/0.75ML ~~LOC~~ SOAJ: 1.7000 mg | SUBCUTANEOUS | 0 refills | Status: DC

## 2022-08-01 NOTE — Progress Notes (Signed)
Office: [phone]  /  Fax: [phone]  WEIGHT SUMMARY AND BIOMETRICS  Weight Lost Since Last Visit: 3lb  No data recorded  Vitals Temp: 98.6 F (37 C) BP: 128/86 Pulse Rate: 91 SpO2: 98 %   Anthropometric Measurements Height: '5\' 9"'$  (1.753 m) Weight: 226 lb (102.5 kg) BMI (Calculated): 33.36 Weight at Last Visit: 229lb Weight Lost Since Last Visit: 3lb Starting Weight: 245lb Total Weight Loss (lbs): 19 lb (8.618 kg)   Body Composition  Body Fat %: 40.2 % Fat Mass (lbs): 91 lbs Muscle Mass (lbs): 128.8 lbs Total Body Water (lbs): 96.8 lbs Visceral Fat Rating : 8   Other Clinical Data Today's Visit #: 31 Starting Date: 03/27/20     HPI  Chief Complaint: OBESITY  Brittany Elliott is here to discuss her progress with her obesity treatment plan. She is on the the Category 3 Plan and states she is following her eating plan approximately 40 % of the time. She states she is exercising 0 minutes 0 days per week.   Interval History:  Since last office visit she has lost 3 pounds.  She reports things are going well.  She is eating 1-2 meals and 0 snacks per day.  She is trying to start going back to the gym.  She is averaging over 10,000 steps per day.    Pharmacotherapy for weight loss: She is currently taking Wegovy 1.7 mg for medical weight loss.  Denies side effects.    Previous pharmacotherapy for medical weight loss:  She has tried [name] and Topamax in the past for weight loss. She stopped Topamax due to daytime sleepiness. Feels Mancel Parsons works better for her by helping reduce her appetite and cravings more.   Bariatric surgery:  Never had bariatric surgery    Insulin Resistance Brittany Elliott has had elevated fasting insulin readings. Goal is HgbA1c < 5.7, fasting insulin closer to 5.   She denies polyphagia. Medication(s): Wegovy 1.7 SQ weekly  Lab Results  Component Value Date   HGBA1C 5.3 05/08/2022   HGBA1C 5.4 05/07/2021   HGBA1C 5.4 01/11/2021   HGBA1C  5.5 03/27/2020   Lab Results  Component Value Date   INSULIN 12.7 05/08/2022   INSULIN 18.8 05/07/2021   INSULIN 14.2 01/11/2021   INSULIN 13.2 03/27/2020     PHYSICAL EXAM:  Blood pressure 128/86, pulse 91, temperature 98.6 F (37 C), height '5\' 9"'$  (1.753 m), weight 226 lb (102.5 kg), last menstrual period 07/09/2022, SpO2 98 %. Body mass index is 33.37 kg/m.  General: She is overweight, cooperative, alert, well developed, and in no acute distress. PSYCH: Has normal mood, affect and thought process.   Extremities: No edema.  Neurologic: No gross sensory or motor deficits. No tremors or fasciculations noted.    DIAGNOSTIC DATA REVIEWED:  BMET    Component Value Date/Time   NA 138 05/08/2022 0823   K 4.5 05/08/2022 0823   CL 105 05/08/2022 0823   CO2 20 05/08/2022 0823   GLUCOSE 87 05/08/2022 0823   GLUCOSE 95 04/21/2018 2111   BUN 9 05/08/2022 0823   CREATININE 0.90 05/08/2022 0823   CALCIUM 9.7 05/08/2022 0823   GFRNONAA 90 03/10/2020 1124   GFRAA 104 03/10/2020 1124   Lab Results  Component Value Date   HGBA1C 5.3 05/08/2022   HGBA1C 5.5 03/27/2020   Lab Results  Component Value Date   INSULIN 12.7 05/08/2022   INSULIN 13.2 03/27/2020   Lab Results  Component Value Date   TSH 1.640 08/07/2021  CBC    Component Value Date/Time   WBC 6.2 05/08/2022 0823   WBC 8.4 11/13/2015 1601   RBC 4.03 05/08/2022 0823   RBC 4.12 11/13/2015 1601   HGB 12.3 05/08/2022 0823   HCT 36.2 05/08/2022 0823   PLT 336 05/08/2022 0823   MCV 90 05/08/2022 0823   MCH 30.5 05/08/2022 0823   MCH 29.1 11/13/2015 1601   MCHC 34.0 05/08/2022 0823   MCHC 33.0 11/13/2015 1601   RDW 11.8 05/08/2022 0823   Iron Studies    Component Value Date/Time   IRON 59 05/08/2022 0823   TIBC 291 08/07/2021 1557   FERRITIN 110 05/08/2022 0823   IRONPCTSAT 20 08/07/2021 1557   Lipid Panel     Component Value Date/Time   CHOL 141 05/07/2021 0800   TRIG 47 05/07/2021 0800   HDL 58  05/07/2021 0800   LDLCALC 72 05/07/2021 0800   Hepatic Function Panel     Component Value Date/Time   PROT 6.9 05/08/2022 0823   ALBUMIN 4.3 05/08/2022 0823   AST 5 05/08/2022 0823   ALT 11 05/08/2022 0823   ALKPHOS 59 05/08/2022 0823   BILITOT 0.6 05/08/2022 0823      Component Value Date/Time   TSH 1.640 08/07/2021 1557   Nutritional Lab Results  Component Value Date   VD25OH 28.8 (L) 05/08/2022   VD25OH 55.6 08/07/2021   VD25OH 86.0 05/07/2021     ASSESSMENT AND PLAN  TREATMENT PLAN FOR OBESITY:  Recommended Dietary Goals  Brittany Elliott is currently in the action stage of change. As such, her goal is to continue weight management plan. She has agreed to keeping a food journal and adhering to recommended goals of 1500 calories and 90+ protein.  Behavioral Intervention  We discussed the following Behavioral Modification Strategies today: increasing lean protein intake, decreasing simple carbohydrates , increasing vegetables, avoiding skipping meals, increasing water intake, and work on meal planning and easy cooking plans.  Additional resources provided today: NA  Recommended Physical Activity Goals  Brittany Elliott has been advised to work up to 150 minutes of moderate intensity aerobic activity a week and strengthening exercises 2-3 times per week for cardiovascular health, weight loss maintenance and preservation of muscle mass.   She has agreed to increase physical activity in their day and reduce sedentary time (increase NEAT).  and Patient also encouraged on scheduling and tracking physical activity.    Pharmacotherapy We discussed various medication options to help [name] with her weight loss efforts and we both agreed to continue [facility] 1.'7mg'$ .  ASSOCIATED CONDITIONS ADDRESSED TODAY  Action/Plan  Insulin resistance -     [id number]; Inject 1.7 mg into the skin once a week.  Dispense: 9 mL; Refill: 0.  Side effects discussed.  Doing well with Wegovy 1.'7mg'$ .     Brittany Elliott will continue to work on weight loss, exercise, and decreasing simple carbohydrates to help decrease the risk of diabetes. Brittany Elliott agreed to follow-up with [name] as directed to closely monitor her progress.   Generalized obesity  BMI 33.0-33.9,adult     Return in about 3 weeks (around 08/22/2022).Marland Kitchen She was informed of the importance of frequent follow up visits to maximize her success with intensive lifestyle modifications for her multiple health conditions.   ATTESTASTION STATEMENTS:  Reviewed by clinician on day of visit: allergies, medications, problem list, medical history, surgical history, family history, social history, and previous encounter notes.     Ailene Rud. Norvin Ohlin FNP-C

## 2022-08-26 ENCOUNTER — Ambulatory Visit: Payer: BC Managed Care – PPO | Admitting: Nurse Practitioner

## 2022-08-26 ENCOUNTER — Encounter: Payer: Self-pay | Admitting: Nurse Practitioner

## 2022-08-26 VITALS — BP 131/87 | HR 93 | Temp 98.4°F | Ht 69.0 in | Wt 226.0 lb

## 2022-08-26 DIAGNOSIS — E669 Obesity, unspecified: Secondary | ICD-10-CM | POA: Diagnosis not present

## 2022-08-26 DIAGNOSIS — E88819 Insulin resistance, unspecified: Secondary | ICD-10-CM | POA: Diagnosis not present

## 2022-08-26 DIAGNOSIS — I10 Essential (primary) hypertension: Secondary | ICD-10-CM

## 2022-08-26 DIAGNOSIS — Z6833 Body mass index (BMI) 33.0-33.9, adult: Secondary | ICD-10-CM | POA: Diagnosis not present

## 2022-08-26 MED ORDER — WEGOVY 1.7 MG/0.75ML ~~LOC~~ SOAJ: 1.7000 mg | SUBCUTANEOUS | 0 refills | Status: DC

## 2022-08-26 NOTE — Patient Instructions (Addendum)
What is a GLP-1 Glucagon like peptide-1 (GLP-1) agonists represent a class of medications used to treat type 2 diabetes mellitus and obesity.  GLP-1 medications mimic the action of a hormone called glucagon like peptide 1.  When blood sugar levels start to rise/increase these drugs stimulate the body to produce more insulin.  When that happens, the extra insulin helps to lower the blood sugar levels in the body.  This in returns helps with decreasing cravings.  These medications also slow the movement of food from the stomach into the small intestine.  This in return helps one to full faster and longer.   Diabetic medications: Approved for treatment of diabetes mellitus but does not have full approval for weight loss use Victoza (liraglutide) Ozempic (semaglutide) Mounjaro Trulicity Rybelsus  Weight loss medications: Approved for long-term weight loss use.        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  Other possible side effects are headaches, belching, diarrhea, tiredness (fatigue), vomiting, upset stomach, dizziness, heartburn and stomach (abdominal pain).  If you think that you are becoming dehydrated, please inform our office or your primary family provider.  Stop immediately and go to ER if you have any symptoms of a serious allergic reaction including swelling of your face, lips, tongue or throat; problems breathing or swallowing; severe rash or itching; fainting or feeling dizzy; or very rapid heart rate.         Steps to starting your start  Contrave  The office will send a prior authorization request to your insurance company for approval. We will send you a mychart message once we hear back from your insurance with a decision.  This can take up to 7-10 business days.   Is Contrave an option for me? Do not take Contrave if you:   Have uncontrolled hypertension  Have or have had seizures  Use other medicines that contain bupropion such as Wellbutrin, Wellbutrin SR, Wellbutrin XL, and Aplenzin.  Have or have had an eating disorder called anorexia (eating very little) or bulimia (eating too much and vomiting to avoid gaining weight)  Are dependent on opioid pain medicines or use medicines to help stop taking opioids such as methadone or buprenorphine, or are in opiate withdrawal  Drink a lot of alcohol and abruptly stop drinking, or use medicines called sedatives (these make you sleepy), benzodiazepines, or anti-seizure medicines and you stop using them all of a sudden  Are taking medicines called monoamine oxidase inhibitors (MAOIs). Ask your healthcare provider or pharmacist if you are not sure if you take an MAOI, including linezoid. Do not start Contrave until you have stopped taking your MAOI for at least 14 days.  Are allergic to naltrexone HCl or bupropion HCl or any of the ingredients in Contrave. See the end of this Medication Guide for a complete list of ingredients in Contrave.  Are pregnant or planning to become pregnant. Tell your healthcare provider right away if you become pregnant while taking CONTRAVE  What is Contrave and how does it work?  Contrave is a prescription only medicine to help with your weight loss. It works on an area of the brain that   controls your appetite.  It is a combination of two medicines that are extended release: naltrexone HCL and bupropion HCL.  One of the ingredients in Contrave, bupropion, is the same ingredient in some other medicines used to treat depression and to help people  quit smoking. However, Contrave is not approved to treat depression or other mental illnesses, or to help people quit smoking (smoking cessation).   This medicine will be most effective when combined with a reduced calorie diet and physical activity.  How should I take Contrave?  Take exactly as your provider tells you to. It is an increasing dose according to the following chart:  Contrave How should I take Contrave?   It is best to take Contrave with food. However, do not take with high-fat meals.   Swallow the extended-release tablet whole. Do not crush, break, or chew it.   If you miss a dose, skip the missed dose and go back to your regular dosing schedule. Do not take extra medicine to make up for a missed dose.  Do not take more than 2 tablets in the morning and 2 tablets in the evening.  Do not take more than 2 tablets at the same time or more than 4 tablets in 1 day.  Do not stop taking Contrave without talking to your provider.   Stopping Contrave suddenly can cause serious side effects, such as seizures.  Swallow Contrave tablets whole. Do not cut, chew, or crush tablets.  Do not take Contrave when eating a high-fat meal. It may increase your risk of seizures.   What should I avoid while taking Contrave?  You should avoid other medicines that contain bupropion such as Wellbutrin, Wellbutrin SR, Wellbutrin XL and Aplenzin.  You should avoid opioid pain medications or medicines to help stop taking opioids such as methadone or buprenorphine. There may be a risk of opioid overdose.  Do not drink alcohol while taking Contrave. If you drink alcohol, talk to your provider before starting Contrave.   Avoid eating a high-fat meal at the same time you take Contrave. It may increase your risk of seizures.   Women who can become pregnant: Use effective birth control (contraception) consistently while taking Contrave. If you miss a menstrual period, STOP Contrave and call our  office immediately. Monthly pregnancy tests will be performed at your appointment if indicated.  What side effects may I notice when taking Contrave?  Side effects that usually do not require medical attention (report to our office if they continue or are bothersome): o Nausea o Constipation (you may take over the counter laxative if needed) o Headache o Dry mouth (drink at least 64 oz. of fluid daily) o Trouble sleeping (insomnia) o Vomiting o Dizziness o Diarrhea   Side effects that you should report to our office as soon as possible: o Seizures: DO NOT take Contrave again o Depression or severe changes in mood o Thoughts about suicide or dying o Feeling anxious, agitated, irritable, restless, or nervous o Slowed breathing and/or shallow breathing o Severe drowsiness o Confusion o Signs of allergic reaction such as rash, itching, hives, fever, swollen lymph glands, painful sores in your mouth or around your eyes, swelling of your lips or tongue, chest pain, or trouble breathing o Increased blood pressure o Increased heart rate or palpitations o Stomach pain lasting more than a few days o Dark urine o Yellowing of the whites of your eyes o Severe tiredness o Sudden changes in vision o Swelling or redness   in or around the eye  o Low blood sugar in Type 2 Diabetes.   Other important information  While taking CONTRAVE, you or your family members should pay close attention to any changes, especially sudden changes, in mood, behaviors, thoughts, or feelings and maintain communication with your provider. You will be asked to sign an informed consent prior to starting Contrave.  If another healthcare provider prescribes narcotic pain medication for you, please call our office immediately for advice regarding Contrave.  Your prescription will be sent electronically to your pharmacy.   Refills will require an office visit                                                                                      

## 2022-08-26 NOTE — Progress Notes (Signed)
Office: [phone]  /  Fax: [phone]  WEIGHT SUMMARY AND BIOMETRICS  Weight Lost Since Last Visit: 0  Weight Gained Since Last Visit: 0   Vitals Temp: 98.4 F (36.9 C) BP: 131/87 Pulse Rate: 93 SpO2: 100 %   Anthropometric Measurements Height: 5\' 9"  (1.753 m) Weight: 226 lb (102.5 kg) BMI (Calculated): 33.36 Weight at Last Visit: 226lb Weight Lost Since Last Visit: 0 Weight Gained Since Last Visit: 0 Starting Weight: 245lb Total Weight Loss (lbs): 19 lb (8.618 kg)   Body Composition  Body Fat %: 39.8 % Fat Mass (lbs): 90.2 lbs Muscle Mass (lbs): 129.4 lbs Total Body Water (lbs): 93.2 lbs Visceral Fat Rating : 8   Other Clinical Data Fasting: yes Labs: no Today's Visit #: 10 Starting Date: 03/27/20     HPI  Chief Complaint: OBESITY  Chirsty is here to discuss her progress with her obesity treatment plan. She is on the the Category 3 Plan and states she is following her eating plan approximately 30 % of the time. She states she is exercising 0 minutes 0 days per week.   Interval History:  Since last office visit she has maintained her weight.  She is staying active but notes it's not consistent.  She notes she feels good when she exercises.     Pharmacotherapy for weight loss: She took her last Wegovy 1.7mg   this past weekend.  Unable to continue due to cost.  Felt she has done well and felt better while taking Wegovy.     Previous pharmacotherapy for medical weight loss:  She has tried [name] and Topamax in the past for weight loss. She stopped Topamax due to daytime sleepiness. Feels Mancel Parsons works better for her by helping reduce her appetite and cravings more.   Bariatric surgery:  Patient never had bariatric surgery.    Hypertension.  Medication(s): HCTZ Denies chest pain, palpitations and SOB. FH:  mother (HTN, CKD, and DMT2), mgm (HTN, CKD, and DMT2)  BP Readings from Last 3 Encounters:  08/26/22 131/87  08/01/22 128/86  07/04/22  (!) 143/85   Lab Results  Component Value Date   CREATININE 0.90 05/08/2022   CREATININE 0.77 05/07/2021   CREATININE 0.82 01/11/2021    Insulin Resistance Gracianna has had elevated fasting insulin readings. Goal is HgbA1c < 5.7, fasting insulin closer to 5.   She denies polyphagia. Medication(s): Wegovy 1.7 SQ weekly and has done well on Wegovy  PHYSICAL EXAM:  Blood pressure 131/87, pulse 93, temperature 98.4 F (36.9 C), height 5\' 9"  (1.753 m), weight 226 lb (102.5 kg), last menstrual period 07/09/2022, SpO2 100 %. Body mass index is 33.37 kg/m.  General: She is overweight, cooperative, alert, well developed, and in no acute distress. PSYCH: Has normal mood, affect and thought process.   Extremities: No edema.  Neurologic: No gross sensory or motor deficits. No tremors or fasciculations noted.    DIAGNOSTIC DATA REVIEWED:  BMET    Component Value Date/Time   NA 138 05/08/2022 0823   K 4.5 05/08/2022 0823   CL 105 05/08/2022 0823   CO2 20 05/08/2022 0823   GLUCOSE 87 05/08/2022 0823   GLUCOSE 95 04/21/2018 2111   BUN 9 05/08/2022 0823   CREATININE 0.90 05/08/2022 0823   CALCIUM 9.7 05/08/2022 0823   GFRNONAA 90 03/10/2020 1124   GFRAA 104 03/10/2020 1124   Lab Results  Component Value Date   HGBA1C 5.3 05/08/2022   HGBA1C 5.5 03/27/2020   Lab Results  Component Value  Date   INSULIN 12.7 05/08/2022   INSULIN 13.2 03/27/2020   Lab Results  Component Value Date   TSH 1.640 08/07/2021   CBC    Component Value Date/Time   WBC 6.2 05/08/2022 0823   WBC 8.4 11/13/2015 1601   RBC 4.03 05/08/2022 0823   RBC 4.12 11/13/2015 1601   HGB 12.3 05/08/2022 0823   HCT 36.2 05/08/2022 0823   PLT 336 05/08/2022 0823   MCV 90 05/08/2022 0823   MCH 30.5 05/08/2022 0823   MCH 29.1 11/13/2015 1601   MCHC 34.0 05/08/2022 0823   MCHC 33.0 11/13/2015 1601   RDW 11.8 05/08/2022 0823   Iron Studies    Component Value Date/Time   IRON 59 05/08/2022 0823   TIBC 291  08/07/2021 1557   FERRITIN 110 05/08/2022 0823   IRONPCTSAT 20 08/07/2021 1557   Lipid Panel     Component Value Date/Time   CHOL 141 05/07/2021 0800   TRIG 47 05/07/2021 0800   HDL 58 05/07/2021 0800   LDLCALC 72 05/07/2021 0800   Hepatic Function Panel     Component Value Date/Time   PROT 6.9 05/08/2022 0823   ALBUMIN 4.3 05/08/2022 0823   AST 5 05/08/2022 0823   ALT 11 05/08/2022 0823   ALKPHOS 59 05/08/2022 0823   BILITOT 0.6 05/08/2022 0823      Component Value Date/Time   TSH 1.640 08/07/2021 1557   Nutritional Lab Results  Component Value Date   VD25OH 28.8 (L) 05/08/2022   VD25OH 55.6 08/07/2021   VD25OH 86.0 05/07/2021     ASSESSMENT AND PLAN  TREATMENT PLAN FOR OBESITY:  Recommended Dietary Goals  Talayla is currently in the action stage of change. As such, her goal is to continue weight management plan. She has agreed to the Category 3 Plan.  Behavioral Intervention  We discussed the following Behavioral Modification Strategies today: increasing lean protein intake, decreasing simple carbohydrates , increasing vegetables, increasing fiber rich foods, avoiding skipping meals, increasing water intake, and work on meal planning and easy cooking plans.  Additional resources provided today: NA  Recommended Physical Activity Goals  Lomie has been advised to work up to 150 minutes of moderate intensity aerobic activity a week and strengthening exercises 2-3 times per week for cardiovascular health, weight loss maintenance and preservation of muscle mass.   She has agreed to Continue current level of physical activity    Pharmacotherapy We discussed various medication options to help [facility] with her weight loss efforts and we both agreed to restart [name] (she has one pen at home) if unable to continue [facility] due to cost.  She would benefit from staying on Wegovy due to her history of HTN, IR and obesity.  Also discussed Contrave and  Rybelsus.  ASSOCIATED CONDITIONS ADDRESSED TODAY  Action/Plan  Essential hypertension -     [id number]; Inject 1.7 mg into the skin once a week.  Dispense: 9 mL; Refill: 0  Continue to follow up with PCP. Continue meds as directed.    Insulin resistance -     [id number]; Inject 1.7 mg into the skin once a week.  Dispense: 9 mL; Refill: 0  Suzane will continue to work on weight loss, exercise, and decreasing simple carbohydrates to help decrease the risk of diabetes. Kendelle agreed to follow-up with [name] as directed to closely monitor her progress.   Generalized obesity -     [id number]; Inject 1.7 mg into the skin once a week.  Dispense: 9 mL; Refill:  0  BMI 33.0-33.9,adult -     [id number]; Inject 1.7 mg into the skin once a week.  Dispense: 9 mL; Refill: 0       Return in about 3 weeks (around 09/16/2022).Marland Kitchen She was informed of the importance of frequent follow up visits to maximize her success with intensive lifestyle modifications for her multiple health conditions.   ATTESTASTION STATEMENTS:  Reviewed by clinician on day of visit: allergies, medications, problem list, medical history, surgical history, family history, social history, and previous encounter notes.    Ailene Rud. Nysir Fergusson FNP-C

## 2022-09-16 ENCOUNTER — Telehealth: Payer: Self-pay

## 2022-09-16 ENCOUNTER — Encounter: Payer: Self-pay | Admitting: Nurse Practitioner

## 2022-09-16 ENCOUNTER — Ambulatory Visit: Payer: BC Managed Care – PPO | Admitting: Nurse Practitioner

## 2022-09-16 VITALS — BP 138/94 | HR 83 | Temp 98.7°F | Ht 69.0 in | Wt 228.0 lb

## 2022-09-16 DIAGNOSIS — Z6833 Body mass index (BMI) 33.0-33.9, adult: Secondary | ICD-10-CM | POA: Diagnosis not present

## 2022-09-16 DIAGNOSIS — I10 Essential (primary) hypertension: Secondary | ICD-10-CM

## 2022-09-16 DIAGNOSIS — E669 Obesity, unspecified: Secondary | ICD-10-CM | POA: Diagnosis not present

## 2022-09-16 MED ORDER — CONTRAVE 8-90 MG PO TB12: ORAL_TABLET | ORAL | 0 refills | Status: DC

## 2022-09-16 NOTE — Patient Instructions (Signed)

## 2022-09-16 NOTE — Telephone Encounter (Signed)
Received from Cover My Meds: Your plan only covers this drug when you meet one of these options: A) You have tried other drugs your plan covers (preferred drugs), and they did not work well for you, or B) Your doctor gives [name] a medical reason you cannot take those other drugs. For your plan, you may need to try up to three preferred drugs. We have denied your request because you do not meet any of these conditions. We reviewed the information we had. Your request has been denied. Your doctor can send [name] any new or missing information for [name] to review. The preferred drugs for your plan are: orlistat, QSYMIA, [name], WEGOVY. (Requirement: 3 in a class with 3 or more alternatives, 2 in a class with 2 alternatives, or 1 in a class with only 1 alternative.) Your doctor may need to get approval from your plan for preferred drugs. For this drug, you may have to meet other criteria. You can request the drug policy for more details. You can also request other plan documents for your review

## 2022-09-16 NOTE — Telephone Encounter (Signed)
PA submitted through Cover My Meds for Contrave. Awaiting insurance determination.  Key: [id number]

## 2022-09-16 NOTE — Telephone Encounter (Signed)
Sent to patients insurance company.

## 2022-09-16 NOTE — Progress Notes (Addendum)
Office: [phone]  /  Fax: [phone]  WEIGHT SUMMARY AND BIOMETRICS  No data recorded Weight Gained Since Last Visit: 2lb   Vitals Temp: 98.7 F (37.1 C) BP: (!) 138/94 Pulse Rate: 83 SpO2: 100 %   Anthropometric Measurements Height:  (1.753 m) Weight: 228 lb (103.4 kg) BMI (Calculated): 33.65 Weight at Last Visit: 226lb Weight Gained Since Last Visit: 2lb Starting Weight: 245lb Total Weight Loss (lbs): 17 lb (7.711 kg)   Body Composition  Body Fat %: 32.6 % Fat Mass (lbs): 74.6 lbs Muscle Mass (lbs): 146.4 lbs Total Body Water (lbs): 98.8 lbs Visceral Fat Rating : 6   Other Clinical Data Fasting: Yes Today's Visit #: 62 Starting Date: 03/27/20     HPI  Chief Complaint: OBESITY  Brittany Elliott is here to discuss her progress with her obesity treatment plan. She is on the the Category 3 Plan and states she is following her eating plan approximately 20 % of the time. She states she is exercising 0 minutes 0 days per week.   Interval History:  Since last office visit she gained 2 pounds.  She is trying to be more active and go to the gym more.  Her goals are to meal prep more, not skip meals, drink more water and be more active.  She is averaging around 10,000 steps daily at work.     Pharmacotherapy for weight loss: She is currently taking [name]  for medical weight loss (has one pen left).  Denies side effects.    Previous pharmacotherapy for medical weight loss:  She has tried [name], [place] and Topamax in the past for weight loss. She stopped Topamax due to daytime sleepiness. Feels [name] works better for her by helping reduce her appetite and cravings more and she lost more weight while taking it.   She was unable to continue [facility] due to cost.    Bariatric surgery:  Patient never had bariatric surgery.   Hypertension Hypertension BP is elevated today. She reports she didn't sleep well last night and is under stress at work due to end of  year testing.  Medication(s): HCTZ Denies chest pain, palpitations and SOB.  BP Readings from Last 3 Encounters:  09/16/22 (!) 138/94  08/26/22 131/87  08/01/22 128/86   Lab Results  Component Value Date   CREATININE 0.90 05/08/2022   CREATININE 0.77 05/07/2021   CREATININE 0.82 01/11/2021         PHYSICAL EXAM:  Blood pressure (!) 138/94, pulse 83, temperature 98.7 F (37.1 C), height  (1.753 m), weight 228 lb (103.4 kg), SpO2 100 %. Body mass index is 33.67 kg/m.  General: She is overweight, cooperative, alert, well developed, and in no acute distress. PSYCH: Has normal mood, affect and thought process.   Extremities: No edema.  Neurologic: No gross sensory or motor deficits. No tremors or fasciculations noted.    DIAGNOSTIC DATA REVIEWED:  BMET    Component Value Date/Time   NA 138 05/08/2022 0823   K 4.5 05/08/2022 0823   CL 105 05/08/2022 0823   CO2 20 05/08/2022 0823   GLUCOSE 87 05/08/2022 0823   GLUCOSE 95 04/21/2018 2111   BUN 9 05/08/2022 0823   CREATININE 0.90 05/08/2022 0823   CALCIUM 9.7 05/08/2022 0823   GFRNONAA 90 03/10/2020 1124   GFRAA 104 03/10/2020 1124   Lab Results  Component Value Date   HGBA1C 5.3 05/08/2022   HGBA1C 5.5 03/27/2020   Lab Results  Component Value Date  INSULIN 12.7 05/08/2022   INSULIN 13.2 03/27/2020   Lab Results  Component Value Date   TSH 1.640 08/07/2021   CBC    Component Value Date/Time   WBC 6.2 05/08/2022 0823   WBC 8.4 11/13/2015 1601   RBC 4.03 05/08/2022 0823   RBC 4.12 11/13/2015 1601   HGB 12.3 05/08/2022 0823   HCT 36.2 05/08/2022 0823   PLT 336 05/08/2022 0823   MCV 90 05/08/2022 0823   MCH 30.5 05/08/2022 0823   MCH 29.1 11/13/2015 1601   MCHC 34.0 05/08/2022 0823   MCHC 33.0 11/13/2015 1601   RDW 11.8 05/08/2022 0823   Iron Studies    Component Value Date/Time   IRON 59 05/08/2022 0823   TIBC 291 08/07/2021 1557   FERRITIN 110 05/08/2022 0823   IRONPCTSAT 20 08/07/2021  1557   Lipid Panel     Component Value Date/Time   CHOL 141 05/07/2021 0800   TRIG 47 05/07/2021 0800   HDL 58 05/07/2021 0800   LDLCALC 72 05/07/2021 0800   Hepatic Function Panel     Component Value Date/Time   PROT 6.9 05/08/2022 0823   ALBUMIN 4.3 05/08/2022 0823   AST 5 05/08/2022 0823   ALT 11 05/08/2022 0823   ALKPHOS 59 05/08/2022 0823   BILITOT 0.6 05/08/2022 0823      Component Value Date/Time   TSH 1.640 08/07/2021 1557   Nutritional Lab Results  Component Value Date   VD25OH 28.8 (L) 05/08/2022   VD25OH 55.6 08/07/2021   VD25OH 86.0 05/07/2021     ASSESSMENT AND PLAN  TREATMENT PLAN FOR OBESITY:  Recommended Dietary Goals  Brittany Elliott is currently in the action stage of change. As such, her goal is to continue weight management plan. She has agreed to the Category 3 Plan.  Behavioral Intervention  We discussed the following Behavioral Modification Strategies today: increasing lean protein intake, decreasing simple carbohydrates , increasing vegetables, increasing lower glycemic fruits, increasing fiber rich foods, avoiding skipping meals, increasing water intake, continue to practice mindfulness when eating, and planning for success.  Additional resources provided today: Contrave handout given.  Recommended Physical Activity Goals  Brittany Elliott has been advised to work up to 150 minutes of moderate intensity aerobic activity a week and strengthening exercises 2-3 times per week for cardiovascular health, weight loss maintenance and preservation of muscle mass.   She has agreed to Think about ways to increase physical activity and Work on scheduling and tracking physical activity.    Pharmacotherapy We discussed various medication options to help Brittany Elliott with her weight loss efforts and we both agreed to start Contrave.  Side effects discussed. Patient knows not to get pregnant while taking any weight loss meds due to possible birth defects.    Avoid  orlistat due to a vitamin D deficiency Avoid Qsymia due to taking Zonegran & HTN She is unable to take [facility] or [name] due to cost.  See note above.  I would prefer for her to be able to continue taking Wegovy or [name] but she is unable to take due to cost, [name] and [place] will cost her over 1,000 per month.    ASSOCIATED CONDITIONS ADDRESSED TODAY  Action/Plan  Essential hypertension Continue to follow up with PCP. Continue meds as directed.   Generalized obesity -     Contrave; Start 1 tablet every morning for 7 days, then 1 tablet twice daily for 7 days, then 2 tablets every morning and one every evening for 7 days and  then 2 po BID.  Dispense: 120 tablet; Refill: 0  BMI 33.0-33.9,adult -     Contrave; Start 1 tablet every morning for 7 days, then 1 tablet twice daily for 7 days, then 2 tablets every morning and one every evening for 7 days and then 2 po BID.  Dispense: 120 tablet; Refill: 0         Return in about 3 weeks (around 10/07/2022).Marland Kitchen She was informed of the importance of frequent follow up visits to maximize her success with intensive lifestyle modifications for her multiple health conditions.   ATTESTASTION STATEMENTS:  Reviewed by clinician on day of visit: allergies, medications, problem list, medical history, surgical history, family history, social history, and previous encounter notes.     Theodis Sato. Remmy Crass FNP-C

## 2022-09-17 ENCOUNTER — Other Ambulatory Visit: Payer: Self-pay | Admitting: Nurse Practitioner

## 2022-09-17 DIAGNOSIS — Z6833 Body mass index (BMI) 33.0-33.9, adult: Secondary | ICD-10-CM

## 2022-09-17 DIAGNOSIS — E669 Obesity, unspecified: Secondary | ICD-10-CM

## 2022-09-17 MED ORDER — CONTRAVE 8-90 MG PO TB12: ORAL_TABLET | ORAL | 0 refills | Status: DC

## 2022-10-07 ENCOUNTER — Ambulatory Visit: Payer: BC Managed Care – PPO | Admitting: Nurse Practitioner

## 2022-10-07 ENCOUNTER — Encounter: Payer: Self-pay | Admitting: Nurse Practitioner

## 2022-10-07 VITALS — BP 149/88 | HR 81 | Temp 98.2°F | Ht 69.0 in | Wt 238.0 lb

## 2022-10-07 DIAGNOSIS — E669 Obesity, unspecified: Secondary | ICD-10-CM | POA: Diagnosis not present

## 2022-10-07 DIAGNOSIS — Z6835 Body mass index (BMI) 35.0-35.9, adult: Secondary | ICD-10-CM | POA: Diagnosis not present

## 2022-10-07 DIAGNOSIS — E559 Vitamin D deficiency, unspecified: Secondary | ICD-10-CM

## 2022-10-07 MED ORDER — VITAMIN D (ERGOCALCIFEROL) 1.25 MG (50000 UNIT) PO CAPS: 50000.0000 [IU] | ORAL_CAPSULE | ORAL | 0 refills | Status: DC

## 2022-10-07 MED ORDER — CONTRAVE 8-90 MG PO TB12: ORAL_TABLET | ORAL | 0 refills | Status: DC

## 2022-10-07 NOTE — Progress Notes (Signed)
Office: [phone]  /  Fax: [phone]  WEIGHT SUMMARY AND BIOMETRICS  No data recorded Weight Gained Since Last Visit: 10lb   Vitals Temp: 98.2 F (36.8 C) BP: (!) 149/88 Pulse Rate: 81 SpO2: 96 %   Anthropometric Measurements Height: 5\' 9"  (1.753 m) Weight: 238 lb (108 kg) BMI (Calculated): 35.13 Weight at Last Visit: 228lb Weight Gained Since Last Visit: 10lb Starting Weight: 245lb Total Weight Loss (lbs): 7 lb (3.175 kg)   Body Composition  Body Fat %: 42.9 % Fat Mass (lbs): 102.2 lbs Muscle Mass (lbs): 129 lbs Total Body Water (lbs): 100.4 lbs Visceral Fat Rating : 9   Other Clinical Data Fasting: Yes Labs: No Today's Visit #: 34 Starting Date: 03/27/20     HPI  Chief Complaint: OBESITY  Brittany Elliott is here to discuss her progress with her obesity treatment plan. She is on the the Category 3 Plan and states she is following her eating plan approximately 10 % of the time. She states she is exercising 0 minutes 0 days per week.   Interval History:  Since last office visit she has gained 10 pounds.  She notes she is stressed and is stress eating.    Pharmacotherapy for weight loss: She is currently taking Contrave 1 po BID (increased dose 4 days ago) for medical weight loss.  Denies side effects.    Previous pharmacotherapy for medical weight loss: She has tried [name], [place] and Topamax in the past for weight loss. She stopped Topamax due to daytime sleepiness. Feels [name] works better for her by helping reduce her appetite and cravings more and she lost more weight while taking it.   She was unable to continue [name] and [name] due to cost.    Bariatric surgery:  Patient has not had bariatric surgery.     PHYSICAL EXAM:  Blood pressure (!) 149/88, pulse 81, temperature 98.2 F (36.8 C), height 5\' 9"  (1.753 m), weight 238 lb (108 kg), SpO2 96 %. Body mass index is 35.15 kg/m.  General: She is overweight, cooperative, alert, well  developed, and in no acute distress. PSYCH: Has normal mood, affect and thought process.   Extremities: No edema.  Neurologic: No gross sensory or motor deficits. No tremors or fasciculations noted.    DIAGNOSTIC DATA REVIEWED:  BMET    Component Value Date/Time   NA 138 05/08/2022 0823   K 4.5 05/08/2022 0823   CL 105 05/08/2022 0823   CO2 20 05/08/2022 0823   GLUCOSE 87 05/08/2022 0823   GLUCOSE 95 04/21/2018 2111   BUN 9 05/08/2022 0823   CREATININE 0.90 05/08/2022 0823   CALCIUM 9.7 05/08/2022 0823   GFRNONAA 90 03/10/2020 1124   GFRAA 104 03/10/2020 1124   Lab Results  Component Value Date   HGBA1C 5.3 05/08/2022   HGBA1C 5.5 03/27/2020   Lab Results  Component Value Date   INSULIN 12.7 05/08/2022   INSULIN 13.2 03/27/2020   Lab Results  Component Value Date   TSH 1.640 08/07/2021   CBC    Component Value Date/Time   WBC 6.2 05/08/2022 0823   WBC 8.4 11/13/2015 1601   RBC 4.03 05/08/2022 0823   RBC 4.12 11/13/2015 1601   HGB 12.3 05/08/2022 0823   HCT 36.2 05/08/2022 0823   PLT 336 05/08/2022 0823   MCV 90 05/08/2022 0823   MCH 30.5 05/08/2022 0823   MCH 29.1 11/13/2015 1601   MCHC 34.0 05/08/2022 0823   MCHC 33.0 11/13/2015 1601   RDW  11.8 05/08/2022 0823   Iron Studies    Component Value Date/Time   IRON 59 05/08/2022 0823   TIBC 291 08/07/2021 1557   FERRITIN 110 05/08/2022 0823   IRONPCTSAT 20 08/07/2021 1557   Lipid Panel     Component Value Date/Time   CHOL 141 05/07/2021 0800   TRIG 47 05/07/2021 0800   HDL 58 05/07/2021 0800   LDLCALC 72 05/07/2021 0800   Hepatic Function Panel     Component Value Date/Time   PROT 6.9 05/08/2022 0823   ALBUMIN 4.3 05/08/2022 0823   AST 5 05/08/2022 0823   ALT 11 05/08/2022 0823   ALKPHOS 59 05/08/2022 0823   BILITOT 0.6 05/08/2022 0823      Component Value Date/Time   TSH 1.640 08/07/2021 1557   Nutritional Lab Results  Component Value Date   VD25OH 28.8 (L) 05/08/2022   VD25OH 55.6  08/07/2021   VD25OH 86.0 05/07/2021     ASSESSMENT AND PLAN  TREATMENT PLAN FOR OBESITY:  Recommended Dietary Goals  Dyonne is currently in the action stage of change. As such, her goal is to continue weight management plan. She has agreed to the Category 3 Plan.  Behavioral Intervention  We discussed the following Behavioral Modification Strategies today: increasing lean protein intake, decreasing simple carbohydrates , increasing vegetables, increasing lower glycemic fruits, increasing fiber rich foods, avoiding skipping meals, increasing water intake, continue to practice mindfulness when eating, and planning for success.  Additional resources provided today: NA  Recommended Physical Activity Goals  Veyda has been advised to work up to 150 minutes of moderate intensity aerobic activity a week and strengthening exercises 2-3 times per week for cardiovascular health, weight loss maintenance and preservation of muscle mass.   She has agreed to Think about ways to increase daily physical activity and overcoming barriers to exercise and Increase physical activity in their day and reduce sedentary time (increase NEAT).   Pharmacotherapy We discussed various medication options to help [facility] with her weight loss efforts and we both agreed to continue Contrave and increase as tolerated.  Side effects discussed.    Avoid orlistat due to a vitamin D deficiency Avoid Qsymia due to taking Zonegran & HTN She is unable to take [facility] or [name] due to cost.  See note above.  I would prefer for her to be able to continue taking Wegovy or [name] but she is unable to take due to cost, [name] and [place] will cost her over 1,000 per month.    ASSOCIATED CONDITIONS ADDRESSED TODAY  Action/Plan  Vitamin D deficiency -     Vitamin D (Ergocalciferol); Take 1 capsule (50,000 Units total) by mouth once a week.  Dispense: 5 capsule; Refill: 0  Low Vitamin D level contributes to fatigue and  are associated with obesity, breast, and colon cancer. She agrees to continue to take prescription Vitamin D @50 ,000 IU every week and will follow-up for routine testing of Vitamin D, at least 2-3 times per year to avoid over-replacement.   Generalized obesity -     Contrave; Take 2 po BID.  Dispense: 120 tablet; Refill: 0  BMI 35.0-35.9,adult -     Contrave; Take 2 po BID.  Dispense: 120 tablet; Refill: 0     Will obtain labs at next visit.     Return in about 4 weeks (around 11/04/2022).Marland Kitchen She was informed of the importance of frequent follow up visits to maximize her success with intensive lifestyle modifications for her multiple health conditions.  ATTESTASTION STATEMENTS:  Reviewed by clinician on day of visit: allergies, medications, problem list, medical history, surgical history, family history, social history, and previous encounter notes.     Theodis Sato. Cyndia Degraff FNP-C

## 2022-11-12 ENCOUNTER — Ambulatory Visit: Payer: BC Managed Care – PPO | Admitting: Nurse Practitioner

## 2022-11-20 ENCOUNTER — Other Ambulatory Visit: Payer: Self-pay | Admitting: Nurse Practitioner

## 2022-11-20 DIAGNOSIS — Z6835 Body mass index (BMI) 35.0-35.9, adult: Secondary | ICD-10-CM

## 2022-11-20 DIAGNOSIS — E669 Obesity, unspecified: Secondary | ICD-10-CM

## 2022-12-03 ENCOUNTER — Encounter: Payer: Self-pay | Admitting: Nurse Practitioner

## 2022-12-03 ENCOUNTER — Ambulatory Visit: Payer: BC Managed Care – PPO | Admitting: Nurse Practitioner

## 2022-12-03 VITALS — BP 130/86 | HR 73 | Temp 98.1°F | Ht 69.0 in | Wt 250.0 lb

## 2022-12-03 DIAGNOSIS — I10 Essential (primary) hypertension: Secondary | ICD-10-CM | POA: Diagnosis not present

## 2022-12-03 DIAGNOSIS — E669 Obesity, unspecified: Secondary | ICD-10-CM | POA: Diagnosis not present

## 2022-12-03 DIAGNOSIS — E559 Vitamin D deficiency, unspecified: Secondary | ICD-10-CM

## 2022-12-03 DIAGNOSIS — Z6836 Body mass index (BMI) 36.0-36.9, adult: Secondary | ICD-10-CM

## 2022-12-03 NOTE — Patient Instructions (Signed)

## 2022-12-03 NOTE — Progress Notes (Unsigned)
Office: [phone]  /  Fax: [phone]  WEIGHT SUMMARY AND BIOMETRICS  No data recorded Weight Gained Since Last Visit: 12lb   Vitals Temp: 98.1 F (36.7 C) BP: 130/86 Pulse Rate: 73 SpO2: 99 %   Anthropometric Measurements Height: 5\' 9"  (1.753 m) Weight: 250 lb (113.4 kg) BMI (Calculated): 36.9 Weight at Last Visit: 238lb Weight Gained Since Last Visit: 12lb Starting Weight: 245lb Total Weight Loss (lbs): 0 lb (0 kg)   Body Composition  Body Fat %: 42.3 % Fat Mass (lbs): 105.8 lbs Muscle Mass (lbs): 137 lbs Total Body Water (lbs): 102.4 lbs Visceral Fat Rating : 10   Other Clinical Data Fasting: No Labs: No Today's Visit #: 35 Starting Date: 03/27/20     HPI  Chief Complaint: OBESITY  Annmargaret is here to discuss her progress with her obesity treatment plan. She is on the the Category 3 Plan and states she is following her eating plan approximately 15 % of the time. She states she is exercising 35 minutes 2-3 days per week.   Interval History:  Since last office visit she has gained 12 pounds.    Pharmacotherapy for weight loss: She is not currently taking medications  for medical weight loss.  Most recently tried Contrave. Stopped 2-3 weeks ago.   Previous pharmacotherapy for medical weight loss:  She has tried [name], [place], [place] and Topamax in the past for weight loss. She stopped Topamax due to daytime sleepiness. Feels [name] works better for her by helping reduce her appetite and cravings more and she lost more weight while taking it.   She was unable to continue [name] and [name] due to cost.    Bariatric surgery:  Patient has not had bariatric surgery.    PHYSICAL EXAM:  Blood pressure 130/86, pulse 73, temperature 98.1 F (36.7 C), height 5\' 9"  (1.753 m), weight 250 lb (113.4 kg), SpO2 99 %. Body mass index is 36.92 kg/m.  General: She is overweight, cooperative, alert, well developed, and in no acute distress. PSYCH: Has  normal mood, affect and thought process.   Extremities: No edema.  Neurologic: No gross sensory or motor deficits. No tremors or fasciculations noted.    DIAGNOSTIC DATA REVIEWED:  BMET    Component Value Date/Time   NA 138 05/08/2022 0823   K 4.5 05/08/2022 0823   CL 105 05/08/2022 0823   CO2 20 05/08/2022 0823   GLUCOSE 87 05/08/2022 0823   GLUCOSE 95 04/21/2018 2111   BUN 9 05/08/2022 0823   CREATININE 0.90 05/08/2022 0823   CALCIUM 9.7 05/08/2022 0823   GFRNONAA 90 03/10/2020 1124   GFRAA 104 03/10/2020 1124   Lab Results  Component Value Date   HGBA1C 5.3 05/08/2022   HGBA1C 5.5 03/27/2020   Lab Results  Component Value Date   INSULIN 12.7 05/08/2022   INSULIN 13.2 03/27/2020   Lab Results  Component Value Date   TSH 1.640 08/07/2021   CBC    Component Value Date/Time   WBC 6.2 05/08/2022 0823   WBC 8.4 11/13/2015 1601   RBC 4.03 05/08/2022 0823   RBC 4.12 11/13/2015 1601   HGB 12.3 05/08/2022 0823   HCT 36.2 05/08/2022 0823   PLT 336 05/08/2022 0823   MCV 90 05/08/2022 0823   MCH 30.5 05/08/2022 0823   MCH 29.1 11/13/2015 1601   MCHC 34.0 05/08/2022 0823   MCHC 33.0 11/13/2015 1601   RDW 11.8 05/08/2022 0823   Iron Studies    Component Value Date/Time  IRON 59 05/08/2022 0823   TIBC 291 08/07/2021 1557   FERRITIN 110 05/08/2022 0823   IRONPCTSAT 20 08/07/2021 1557   Lipid Panel     Component Value Date/Time   CHOL 141 05/07/2021 0800   TRIG 47 05/07/2021 0800   HDL 58 05/07/2021 0800   LDLCALC 72 05/07/2021 0800   Hepatic Function Panel     Component Value Date/Time   PROT 6.9 05/08/2022 0823   ALBUMIN 4.3 05/08/2022 0823   AST 5 05/08/2022 0823   ALT 11 05/08/2022 0823   ALKPHOS 59 05/08/2022 0823   BILITOT 0.6 05/08/2022 0823      Component Value Date/Time   TSH 1.640 08/07/2021 1557   Nutritional Lab Results  Component Value Date   VD25OH 28.8 (L) 05/08/2022   VD25OH 55.6 08/07/2021   VD25OH 86.0 05/07/2021      ASSESSMENT AND PLAN  TREATMENT PLAN FOR OBESITY:  Recommended Dietary Goals  Tecora is currently in the action stage of change. As such, her goal is to continue weight management plan. She has agreed to the Category 3 Plan.  Behavioral Intervention  We discussed the following Behavioral Modification Strategies today: increasing lean protein intake, decreasing simple carbohydrates , increasing vegetables, increasing lower glycemic fruits, increasing water intake, work on tracking and journaling calories using tracking application, keeping healthy foods at home, continue to practice mindfulness when eating, and planning for success.  Additional resources provided today: NA  Recommended Physical Activity Goals  Phillina has been advised to work up to 150 minutes of moderate intensity aerobic activity a week and strengthening exercises 2-3 times per week for cardiovascular health, weight loss maintenance and preservation of muscle mass.   She has agreed to {EMEXERCISE:28847::"Think about ways to increase daily physical activity and overcoming barriers to exercise"}   Pharmacotherapy We discussed various medication options to help [facility] with her weight loss efforts and we both agreed to ***.  ASSOCIATED CONDITIONS ADDRESSED TODAY  Action/Plan  There are no diagnoses linked to this encounter.     Will obtain labs at next visit per patient request  No follow-ups on file.Marland Kitchen She was informed of the importance of frequent follow up visits to maximize her success with intensive lifestyle modifications for her multiple health conditions.   ATTESTASTION STATEMENTS:  Reviewed by clinician on day of visit: allergies, medications, problem list, medical history, surgical history, family history, social history, and previous encounter notes.   Time spent on visit including pre-visit chart review and post-visit care and charting was *** minutes.    Theodis Sato. Annleigh Knueppel FNP-C

## 2022-12-05 MED ORDER — QSYMIA 7.5-46 MG PO CP24
1.0000 | ORAL_CAPSULE | Freq: Every day | ORAL | 0 refills | Status: DC
Start: 2022-12-05 — End: 2023-01-14

## 2022-12-05 MED ORDER — QSYMIA 3.75-23 MG PO CP24
1.0000 | ORAL_CAPSULE | Freq: Every day | ORAL | 0 refills | Status: DC
Start: 2022-12-05 — End: 2023-01-14

## 2022-12-05 NOTE — Telephone Encounter (Signed)
Please advise. Patient is stating she need her medication and wanted to make sure Judeth Cornfield did not forget about it. Thanks!

## 2022-12-16 ENCOUNTER — Encounter: Payer: Self-pay | Admitting: Nurse Practitioner

## 2022-12-16 ENCOUNTER — Ambulatory Visit: Payer: BC Managed Care – PPO | Admitting: Nurse Practitioner

## 2022-12-16 VITALS — BP 129/89 | HR 83 | Temp 98.4°F | Ht 69.0 in | Wt 249.0 lb

## 2022-12-16 DIAGNOSIS — I10 Essential (primary) hypertension: Secondary | ICD-10-CM

## 2022-12-16 DIAGNOSIS — E559 Vitamin D deficiency, unspecified: Secondary | ICD-10-CM

## 2022-12-16 DIAGNOSIS — Z6836 Body mass index (BMI) 36.0-36.9, adult: Secondary | ICD-10-CM | POA: Diagnosis not present

## 2022-12-16 DIAGNOSIS — E669 Obesity, unspecified: Secondary | ICD-10-CM

## 2022-12-16 MED ORDER — VITAMIN D (ERGOCALCIFEROL) 1.25 MG (50000 UNIT) PO CAPS
50000.0000 [IU] | ORAL_CAPSULE | ORAL | 0 refills | Status: DC
Start: 2022-12-16 — End: 2023-02-20

## 2022-12-16 NOTE — Progress Notes (Signed)
Office: [phone]  /  Fax: [phone]  WEIGHT SUMMARY AND BIOMETRICS  Weight Lost Since Last Visit: 1lb  Weight Gained Since Last Visit: 0lb   Vitals Temp: 98.4 F (36.9 C) BP: 129/89 Pulse Rate: 83 SpO2: 100 %   Anthropometric Measurements Height: 5\' 9"  (1.753 m) Weight: 249 lb (112.9 kg) BMI (Calculated): 36.75 Weight at Last Visit: 250lb Weight Lost Since Last Visit: 1lb Weight Gained Since Last Visit: 0lb Starting Weight: 245lb Total Weight Loss (lbs): 0 lb (0 kg)   Body Composition  Body Fat %: 42.5 % Fat Mass (lbs): 106 lbs Muscle Mass (lbs): 136.2 lbs Total Body Water (lbs): 98.6 lbs Visceral Fat Rating : 10   Other Clinical Data Fasting: No Labs: No Today's Visit #: 36 Starting Date: 03/27/20     HPI  Chief Complaint: OBESITY  Brittany Elliott is here to discuss her progress with her obesity treatment plan. She is on the the Category 3 Plan and states she is following her eating plan approximately 30 % of the time. She states she is exercising 0 minutes 0 days per week.   Interval History:  Since last office visit she has lost 1 pound.     Pharmacotherapy for weight loss: She is currently taking Qsymia dose 1 for medical weight loss-started yesterday.  Denies side effects.  Patient uses nuvaring.    Previous pharmacotherapy for medical weight loss:  She has tried [name], [place], [place] and Topamax in the past for weight loss. She stopped Topamax due to daytime sleepiness. Feels [name] works better for her by helping reduce her appetite and cravings more and she lost more weight while taking it.   She was unable to continue [name] and [name] due to cost.    Bariatric surgery:  has not had bariatric surgery    Hypertension Hypertension: BP is elevated today.  Medication(s): not currently on meds.  Hasn't taken hydrochlorothiazide in months.   Denies chest pain, palpitations and SOB. Multiple family members with HTN  BP Readings from Last  3 Encounters:  12/16/22 129/89  12/03/22 130/86  10/07/22 (!) 149/88   Lab Results  Component Value Date   CREATININE 0.90 05/08/2022   CREATININE 0.77 05/07/2021   CREATININE 0.82 01/11/2021     Vit D deficiency  She is taking Vit D 50,000 IU weekly.  Denies side effects.  Denies nausea, vomiting or muscle weakness.    Lab Results  Component Value Date   VD25OH 28.8 (L) 05/08/2022   VD25OH 55.6 08/07/2021   VD25OH 86.0 05/07/2021     PHYSICAL EXAM:  Blood pressure 129/89, pulse 83, temperature 98.4 F (36.9 C), height 5\' 9"  (1.753 m), weight 249 lb (112.9 kg), SpO2 100%. Body mass index is 36.77 kg/m.  General: She is overweight, cooperative, alert, well developed, and in no acute distress. PSYCH: Has normal mood, affect and thought process.   Extremities: No edema.  Neurologic: No gross sensory or motor deficits. No tremors or fasciculations noted.    DIAGNOSTIC DATA REVIEWED:  BMET    Component Value Date/Time   NA 138 05/08/2022 0823   K 4.5 05/08/2022 0823   CL 105 05/08/2022 0823   CO2 20 05/08/2022 0823   GLUCOSE 87 05/08/2022 0823   GLUCOSE 95 04/21/2018 2111   BUN 9 05/08/2022 0823   CREATININE 0.90 05/08/2022 0823   CALCIUM 9.7 05/08/2022 0823   GFRNONAA 90 03/10/2020 1124   GFRAA 104 03/10/2020 1124   Lab Results  Component Value Date  HGBA1C 5.3 05/08/2022   HGBA1C 5.5 03/27/2020   Lab Results  Component Value Date   INSULIN 12.7 05/08/2022   INSULIN 13.2 03/27/2020   Lab Results  Component Value Date   TSH 1.640 08/07/2021   CBC    Component Value Date/Time   WBC 6.2 05/08/2022 0823   WBC 8.4 11/13/2015 1601   RBC 4.03 05/08/2022 0823   RBC 4.12 11/13/2015 1601   HGB 12.3 05/08/2022 0823   HCT 36.2 05/08/2022 0823   PLT 336 05/08/2022 0823   MCV 90 05/08/2022 0823   MCH 30.5 05/08/2022 0823   MCH 29.1 11/13/2015 1601   MCHC 34.0 05/08/2022 0823   MCHC 33.0 11/13/2015 1601   RDW 11.8 05/08/2022 0823   Iron Studies     Component Value Date/Time   IRON 59 05/08/2022 0823   TIBC 291 08/07/2021 1557   FERRITIN 110 05/08/2022 0823   IRONPCTSAT 20 08/07/2021 1557   Lipid Panel     Component Value Date/Time   CHOL 141 05/07/2021 0800   TRIG 47 05/07/2021 0800   HDL 58 05/07/2021 0800   LDLCALC 72 05/07/2021 0800   Hepatic Function Panel     Component Value Date/Time   PROT 6.9 05/08/2022 0823   ALBUMIN 4.3 05/08/2022 0823   AST 5 05/08/2022 0823   ALT 11 05/08/2022 0823   ALKPHOS 59 05/08/2022 0823   BILITOT 0.6 05/08/2022 0823      Component Value Date/Time   TSH 1.640 08/07/2021 1557   Nutritional Lab Results  Component Value Date   VD25OH 28.8 (L) 05/08/2022   VD25OH 55.6 08/07/2021   VD25OH 86.0 05/07/2021     ASSESSMENT AND PLAN  TREATMENT PLAN FOR OBESITY:  Recommended Dietary Goals  Brittany Elliott is currently in the action stage of change. As such, her goal is to continue weight management plan. She has agreed to the Category 3 Plan.  Behavioral Intervention  We discussed the following Behavioral Modification Strategies today: increasing lean protein intake, decreasing simple carbohydrates , increasing vegetables, increasing lower glycemic fruits, avoiding skipping meals, increasing water intake, work on meal planning and preparation, work on managing stress, creating time for self-care and relaxation measures, continue to practice mindfulness when eating, and planning for success.  Additional resources provided today: NA  Recommended Physical Activity Goals  Brittany Elliott has been advised to work up to 150 minutes of moderate intensity aerobic activity a week and strengthening exercises 2-3 times per week for cardiovascular health, weight loss maintenance and preservation of muscle mass.   She has agreed to Think about ways to increase daily physical activity and overcoming barriers to exercise and Increase physical activity in their day and reduce sedentary time (increase  NEAT).   Pharmacotherapy We discussed various medication options to help [facility] with her weight loss efforts and we both agreed to continue Qsymia dose 1 for 2 weeks and then will increase to dose 2.  ASSOCIATED CONDITIONS ADDRESSED TODAY  Action/Plan  Essential hypertension To restart HCTC.  Side effects discussed Will continue to monitor   Vitamin D deficiency -     Vitamin D (Ergocalciferol); Take 1 capsule (50,000 Units total) by mouth once a week.  Dispense: 5 capsule; Refill: 0  Low Vitamin D level contributes to fatigue and are associated with obesity, breast, and colon cancer. She agrees to continue to take prescription Vitamin D @50 ,000 IU every week and will follow-up for routine testing of Vitamin D, at least 2-3 times per year to avoid over-replacement.  Generalized obesity  BMI 36.0-36.9,adult     Will obtain labs at next visit.      Return in about 4 weeks (around 01/13/2023).Marland Kitchen She was informed of the importance of frequent follow up visits to maximize her success with intensive lifestyle modifications for her multiple health conditions.   ATTESTASTION STATEMENTS:  Reviewed by clinician on day of visit: allergies, medications, problem list, medical history, surgical history, family history, social history, and previous encounter notes.     Theodis Sato. Tamanika Heiney FNP-C

## 2022-12-17 ENCOUNTER — Encounter: Payer: Self-pay | Admitting: Nurse Practitioner

## 2022-12-17 ENCOUNTER — Other Ambulatory Visit: Payer: Self-pay | Admitting: Nurse Practitioner

## 2022-12-17 DIAGNOSIS — I10 Essential (primary) hypertension: Secondary | ICD-10-CM

## 2022-12-17 MED ORDER — HYDROCHLOROTHIAZIDE 25 MG PO TABS: 25.0000 mg | ORAL_TABLET | Freq: Every day | ORAL | 0 refills | Status: DC

## 2023-01-01 ENCOUNTER — Ambulatory Visit: Payer: BC Managed Care – PPO | Admitting: Physician Assistant

## 2023-01-02 ENCOUNTER — Ambulatory Visit: Payer: BC Managed Care – PPO | Admitting: Physician Assistant

## 2023-01-08 ENCOUNTER — Other Ambulatory Visit: Payer: Self-pay | Admitting: Nurse Practitioner

## 2023-01-08 DIAGNOSIS — I10 Essential (primary) hypertension: Secondary | ICD-10-CM

## 2023-01-14 ENCOUNTER — Ambulatory Visit: Payer: BC Managed Care – PPO | Admitting: Nurse Practitioner

## 2023-01-14 ENCOUNTER — Encounter: Payer: Self-pay | Admitting: Nurse Practitioner

## 2023-01-14 VITALS — BP 123/78 | HR 84 | Temp 98.3°F | Ht 69.0 in | Wt 246.0 lb

## 2023-01-14 DIAGNOSIS — E669 Obesity, unspecified: Secondary | ICD-10-CM

## 2023-01-14 DIAGNOSIS — E88819 Insulin resistance, unspecified: Secondary | ICD-10-CM

## 2023-01-14 DIAGNOSIS — E559 Vitamin D deficiency, unspecified: Secondary | ICD-10-CM | POA: Diagnosis not present

## 2023-01-14 DIAGNOSIS — I10 Essential (primary) hypertension: Secondary | ICD-10-CM | POA: Diagnosis not present

## 2023-01-14 DIAGNOSIS — Z13220 Encounter for screening for lipoid disorders: Secondary | ICD-10-CM

## 2023-01-14 DIAGNOSIS — Z79899 Other long term (current) drug therapy: Secondary | ICD-10-CM

## 2023-01-14 DIAGNOSIS — Z6836 Body mass index (BMI) 36.0-36.9, adult: Secondary | ICD-10-CM

## 2023-01-14 MED ORDER — QSYMIA 7.5-46 MG PO CP24
1.0000 | ORAL_CAPSULE | Freq: Every day | ORAL | 0 refills | Status: DC
Start: 2023-01-14 — End: 2023-01-20

## 2023-01-14 NOTE — Progress Notes (Signed)
Office: [phone]  /  Fax: [phone]  WEIGHT SUMMARY AND BIOMETRICS  Weight Lost Since Last Visit: 3lb  Weight Gained Since Last Visit: 0lb   Vitals Temp: 98.3 F (36.8 C) BP: 123/78 Pulse Rate: 84 SpO2: 99 %   Anthropometric Measurements Height: 5\' 9"  (1.753 m) Weight: 246 lb (111.6 kg) BMI (Calculated): 36.31 Weight at Last Visit: 249lb Weight Lost Since Last Visit: 3lb Weight Gained Since Last Visit: 0lb Starting Weight: 245lb Total Weight Loss (lbs): 0 lb (0 kg)   Body Composition  Body Fat %: 37.3 % Fat Mass (lbs): 91.8 lbs Muscle Mass (lbs): 146.6 lbs Total Body Water (lbs): 98.2 lbs Visceral Fat Rating : 8   Other Clinical Data Fasting: Yes Labs: No Today's Visit #: 37 Starting Date: 03/27/20     HPI  Chief Complaint: OBESITY  Brittany Elliott is here to discuss her progress with her obesity treatment plan. She is on the the Category 3 Plan and states she is following her eating plan approximately 35 % of the time. She states she is exercising 30 minutes 3-4 days per week.   Interval History:  Since last office visit she has lost 3 pounds.   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  BF:  2 eggs and another protein Lunch:  [place] sandwich or school lunch Dinner:  protein with a vegetables Snacks:  denies Drinks:  water and Gatorade     Pharmacotherapy for weight loss: She is currently taking Qsymia dose 2 for medical weight loss.  Denies side effects.  Patient uses nuvaring for [facility].    Previous pharmacotherapy for medical weight loss:  She has tried [name], [place], [place] and Topamax. She stopped Topamax due to daytime sleepiness. Feels [name] works better for her by helping reduce her appetite and cravings more and she lost more weight while taking it. She was unable to continue [name] and [name] due to cost.   Bariatric surgery:  Patient has not had bariatric  surgery  Vit D deficiency  She is taking Vit D 50,000 IU weekly.  Denies side effects.  Denies nausea, vomiting or muscle weakness.    Lab Results  Component Value Date   VD25OH 28.8 (L) 05/08/2022   VD25OH 55.6 08/07/2021   VD25OH 86.0 05/07/2021     Hypertension BP looks better today.  Medication(s): hydrochlorothiazide 25mg .  Denies side effects.  Denies chest pain, palpitations and SOB.  BP Readings from Last 3 Encounters:  01/14/23 123/78  12/16/22 129/89  12/03/22 130/86   Lab Results  Component Value Date   CREATININE 0.90 05/08/2022   CREATININE 0.77 05/07/2021   CREATININE 0.82 01/11/2021    Insulin Resistance Last fasting insulin was 12.7. A1c was 5.3. Polyphagia:Yes Medication(s): none-hasn't taken Metformin in the past.   Lab Results  Component Value Date   HGBA1C 5.3 05/08/2022   HGBA1C 5.4 05/07/2021   HGBA1C 5.4 01/11/2021   HGBA1C 5.5 03/27/2020   Lab Results  Component Value Date   INSULIN 12.7 05/08/2022   INSULIN 18.8 05/07/2021   INSULIN 14.2 01/11/2021   INSULIN 13.2 03/27/2020   Vit D deficiency  She is taking Vit D 50,000 IU weekly.  Denies side effects.  Denies nausea, vomiting or muscle weakness.    Lab Results  Component Value Date   VD25OH 28.8 (L) 05/08/2022   VD25OH 55.6 08/07/2021   VD25OH 86.0 05/07/2021  PHYSICAL EXAM:  Blood pressure 123/78, pulse 84, temperature 98.3 F (36.8 C), height 5\' 9"  (1.753 m), weight 246 lb (111.6 kg), SpO2 99%. Body mass index is 36.33 kg/m.  General: She is overweight, cooperative, alert, well developed, and in no acute distress. PSYCH: Has normal mood, affect and thought process.   Extremities: No edema.  Neurologic: No gross sensory or motor deficits. No tremors or fasciculations noted.    DIAGNOSTIC DATA REVIEWED:  BMET    Component Value Date/Time   NA 138 05/08/2022 0823   K 4.5 05/08/2022 0823   CL 105 05/08/2022 0823   CO2 20 05/08/2022 0823   GLUCOSE 87 05/08/2022  0823   GLUCOSE 95 04/21/2018 2111   BUN 9 05/08/2022 0823   CREATININE 0.90 05/08/2022 0823   CALCIUM 9.7 05/08/2022 0823   GFRNONAA 90 03/10/2020 1124   GFRAA 104 03/10/2020 1124   Lab Results  Component Value Date   HGBA1C 5.3 05/08/2022   HGBA1C 5.5 03/27/2020   Lab Results  Component Value Date   INSULIN 12.7 05/08/2022   INSULIN 13.2 03/27/2020   Lab Results  Component Value Date   TSH 1.640 08/07/2021   CBC    Component Value Date/Time   WBC 6.2 05/08/2022 0823   WBC 8.4 11/13/2015 1601   RBC 4.03 05/08/2022 0823   RBC 4.12 11/13/2015 1601   HGB 12.3 05/08/2022 0823   HCT 36.2 05/08/2022 0823   PLT 336 05/08/2022 0823   MCV 90 05/08/2022 0823   MCH 30.5 05/08/2022 0823   MCH 29.1 11/13/2015 1601   MCHC 34.0 05/08/2022 0823   MCHC 33.0 11/13/2015 1601   RDW 11.8 05/08/2022 0823   Iron Studies    Component Value Date/Time   IRON 59 05/08/2022 0823   TIBC 291 08/07/2021 1557   FERRITIN 110 05/08/2022 0823   IRONPCTSAT 20 08/07/2021 1557   Lipid Panel     Component Value Date/Time   CHOL 141 05/07/2021 0800   TRIG 47 05/07/2021 0800   HDL 58 05/07/2021 0800   LDLCALC 72 05/07/2021 0800   Hepatic Function Panel     Component Value Date/Time   PROT 6.9 05/08/2022 0823   ALBUMIN 4.3 05/08/2022 0823   AST 5 05/08/2022 0823   ALT 11 05/08/2022 0823   ALKPHOS 59 05/08/2022 0823   BILITOT 0.6 05/08/2022 0823      Component Value Date/Time   TSH 1.640 08/07/2021 1557   Nutritional Lab Results  Component Value Date   VD25OH 28.8 (L) 05/08/2022   VD25OH 55.6 08/07/2021   VD25OH 86.0 05/07/2021     ASSESSMENT AND PLAN  TREATMENT PLAN FOR OBESITY:  Recommended Dietary Goals  Brittany Elliott is currently in the action stage of change. As such, her goal is to continue weight management plan. She has agreed to the Category 3 Plan.  Behavioral Intervention  We discussed the following Behavioral Modification Strategies today: increasing lean protein  intake, decreasing simple carbohydrates , increasing vegetables, increasing lower glycemic fruits, increasing water intake, work on meal planning and preparation, reading food labels , continue to practice mindfulness when eating, and planning for success.  Additional resources provided today: NA  Recommended Physical Activity Goals  Paislyn has been advised to work up to 150 minutes of moderate intensity aerobic activity a week and strengthening exercises 2-3 times per week for cardiovascular health, weight loss maintenance and preservation of muscle mass.   She has agreed to Continue current level of physical activity  and Think  about ways to increase daily physical activity and overcoming barriers to exercise   Pharmacotherapy We discussed various medication options to help [facility] with her weight loss efforts and we both agreed to continue Qsymia.  Side effects discussed.  ASSOCIATED CONDITIONS ADDRESSED TODAY  Action/Plan  Essential hypertension Continue to follow up with PCP.  Continue meds as directed.   Insulin resistance -     Insulin, random -     Hemoglobin A1c  [place] will continue to work on weight loss, exercise, and decreasing simple carbohydrates to help decrease the risk of diabetes. Idalie agreed to follow-up with [name] as directed to closely monitor her progress.   Vitamin D deficiency -     VITAMIN D 25 Hydroxy (Vit-D Deficiency, Fractures)  Low Vitamin D level contributes to fatigue and are associated with obesity, breast, and colon cancer. She agrees to continue to take prescription Vitamin D @50 ,000 IU every week and will follow-up for routine testing of Vitamin D, at least 2-3 times per year to avoid over-replacement.   Screening for hyperlipidemia -     Lipid Panel With LDL/HDL Ratio  Medication management -     Comprehensive metabolic panel  Generalized obesity -     TSH -     Qsymia; Take 1 capsule by mouth daily.  Dispense: 30 capsule; Refill:  0  BMI 36.0-36.9,adult -     TSH -     Qsymia; Take 1 capsule by mouth daily.  Dispense: 30 capsule; Refill: 0         Return in about 2 weeks (around 01/28/2023).Marland Kitchen She was informed of the importance of frequent follow up visits to maximize her success with intensive lifestyle modifications for her multiple health conditions.   ATTESTASTION STATEMENTS:  Reviewed by clinician on day of visit: allergies, medications, problem list, medical history, surgical history, family history, social history, and previous encounter notes.     Theodis Sato. Bluma Buresh FNP-C

## 2023-01-14 NOTE — Patient Instructions (Signed)

## 2023-01-15 LAB — LIPID PANEL WITH LDL/HDL RATIO
Cholesterol, Total: 145 mg/dL (ref 100–199)
HDL: 61 mg/dL (ref >39)
LDL Chol Calc (NIH): 72 mg/dL (ref 0–99)
LDL/HDL Ratio: 1.2 ratio (ref 0.0–3.2)
Triglycerides: 55 mg/dL (ref 0–149)
VLDL Cholesterol Cal: 12 mg/dL (ref 5–40)

## 2023-01-15 LAB — COMPREHENSIVE METABOLIC PANEL
ALT: 29 IU/L (ref 0–32)
AST: 13 IU/L (ref 0–40)
Albumin: 4.2 g/dL (ref 3.9–4.9)
Alkaline Phosphatase: 71 IU/L (ref 44–121)
BUN/Creatinine Ratio: 16 (ref 9–23)
BUN: 13 mg/dL (ref 6–20)
Bilirubin Total: 0.8 mg/dL (ref 0.0–1.2)
CO2: 21 mmol/L (ref 20–29)
Calcium: 9.3 mg/dL (ref 8.7–10.2)
Chloride: 105 mmol/L (ref 96–106)
Creatinine, Ser: 0.82 mg/dL (ref 0.57–1.00)
Globulin, Total: 2.8 g/dL (ref 1.5–4.5)
Glucose: 87 mg/dL (ref 70–99)
Potassium: 4.1 mmol/L (ref 3.5–5.2)
Sodium: 138 mmol/L (ref 134–144)
Total Protein: 7 g/dL (ref 6.0–8.5)
eGFR: 96 mL/min/{1.73_m2} (ref >59)

## 2023-01-15 LAB — VITAMIN D 25 HYDROXY (VIT D DEFICIENCY, FRACTURES): Vit D, 25-Hydroxy: 39.4 ng/mL (ref 30.0–100.0)

## 2023-01-15 LAB — INSULIN, RANDOM: INSULIN: 18.1 u[IU]/mL (ref 2.6–24.9)

## 2023-01-15 LAB — HEMOGLOBIN A1C
Est. average glucose Bld gHb Est-mCnc: 114 mg/dL
Hgb A1c MFr Bld: 5.6 % (ref 4.8–5.6)

## 2023-01-15 LAB — TSH: TSH: 1.79 u[IU]/mL (ref 0.450–4.500)

## 2023-01-17 ENCOUNTER — Encounter: Payer: Self-pay | Admitting: Nurse Practitioner

## 2023-01-20 MED ORDER — QSYMIA 7.5-46 MG PO CP24
1.0000 | ORAL_CAPSULE | Freq: Every day | ORAL | 0 refills | Status: DC
Start: 2023-01-20 — End: 2023-02-20

## 2023-01-20 NOTE — Addendum Note (Signed)
Addended by: Irene Limbo on: 01/20/2023 10:47 AM   Modules accepted: Orders

## 2023-01-29 ENCOUNTER — Ambulatory Visit: Payer: BC Managed Care – PPO | Admitting: Nurse Practitioner

## 2023-02-20 ENCOUNTER — Encounter: Payer: Self-pay | Admitting: Nurse Practitioner

## 2023-02-20 ENCOUNTER — Ambulatory Visit (INDEPENDENT_AMBULATORY_CARE_PROVIDER_SITE_OTHER): Payer: BC Managed Care – PPO | Admitting: Nurse Practitioner

## 2023-02-20 VITALS — BP 138/85 | HR 85 | Temp 98.6°F | Ht 69.0 in | Wt 258.0 lb

## 2023-02-20 DIAGNOSIS — I10 Essential (primary) hypertension: Secondary | ICD-10-CM | POA: Diagnosis not present

## 2023-02-20 DIAGNOSIS — Z6838 Body mass index (BMI) 38.0-38.9, adult: Secondary | ICD-10-CM | POA: Diagnosis not present

## 2023-02-20 DIAGNOSIS — E669 Obesity, unspecified: Secondary | ICD-10-CM

## 2023-02-20 DIAGNOSIS — E559 Vitamin D deficiency, unspecified: Secondary | ICD-10-CM | POA: Diagnosis not present

## 2023-02-20 MED ORDER — QSYMIA 11.25-69 MG PO CP24
1.0000 | ORAL_CAPSULE | Freq: Every day | ORAL | 0 refills | Status: DC
Start: 2023-02-20 — End: 2023-04-02

## 2023-02-20 MED ORDER — VITAMIN D (ERGOCALCIFEROL) 1.25 MG (50000 UNIT) PO CAPS
50000.0000 [IU] | ORAL_CAPSULE | ORAL | 0 refills | Status: DC
Start: 2023-02-20 — End: 2023-04-02

## 2023-02-20 MED ORDER — HYDROCHLOROTHIAZIDE 25 MG PO TABS
25.0000 mg | ORAL_TABLET | Freq: Every day | ORAL | 0 refills | Status: DC
Start: 2023-02-20 — End: 2023-04-02

## 2023-02-20 NOTE — Patient Instructions (Signed)

## 2023-02-20 NOTE — Progress Notes (Signed)
Office: [phone]  /  Fax: [phone]  WEIGHT SUMMARY AND BIOMETRICS  Weight Lost Since Last Visit: 0lb  Weight Gained Since Last Visit: 12lb   Vitals Temp: 98.6 F (37 C) BP: 138/85 Pulse Rate: 85 SpO2: 100 %   Anthropometric Measurements Height: 5\' 9"  (1.753 m) Weight: 258 lb (117 kg) BMI (Calculated): 38.08 Weight at Last Visit: 246lb Weight Lost Since Last Visit: 0lb Weight Gained Since Last Visit: 12lb Starting Weight: 245lb Total Weight Loss (lbs): 0 lb (0 kg)   Body Composition  Body Fat %: 43.9 % Fat Mass (lbs): 113.4 lbs Muscle Mass (lbs): 137.8 lbs Total Body Water (lbs): 102.2 lbs Visceral Fat Rating : 11   Other Clinical Data Fasting: No Labs: No Today's Visit #: 37 Starting Date: 03/27/20     HPI  Chief Complaint: OBESITY  Brittany Elliott is here to discuss her progress with her obesity treatment plan. She is on the the Category 3 Plan and states she is following her eating plan approximately 30 % of the time. She states she is exercising 0 minutes 0 days per week.  She is averaging around 12,000 steps daily at work. Recently joined the gym.     Interval History:  Since last office visit she has gained 12 pounds.  She is drinking water and a soda daily.  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     Pharmacotherapy for weight loss: She is currently taking Qsymia 7.5-46mg  for medical weight loss.  Denies side effects. Doesn't feel that it is helping with polyphagia or cravings.    Previous pharmacotherapy for medical weight loss:  She has tried [name], [name], [name] and Topamax. She stopped Topamax due to daytime sleepiness. Feels [name] works better for her by helping reduce her appetite and cravings more and she lost more weight while taking it. She was unable to continue [name] and [name] due to cost.   Bariatric surgery:  Patient has not had bariatric surgery.   Hypertension Hypertension stable.  Medication(s): HCTZ25mg .  Denies side  effects.   Denies chest pain, palpitations and SOB.  BP Readings from Last 3 Encounters:  02/20/23 138/85  01/14/23 123/78  12/16/22 129/89   Lab Results  Component Value Date   CREATININE 0.82 01/14/2023   CREATININE 0.90 05/08/2022   CREATININE 0.77 05/07/2021    Vit D deficiency  She is taking Vit D 50,000 IU weekly.  Denies side effects.  Denies nausea, vomiting or muscle weakness.    Lab Results  Component Value Date   VD25OH 39.4 01/14/2023   VD25OH 28.8 (L) 05/08/2022   VD25OH 55.6 08/07/2021     PHYSICAL EXAM:  Blood pressure 138/85, pulse 85, temperature 98.6 F (37 C), height 5\' 9"  (1.753 m), weight 258 lb (117 kg), SpO2 100%. Body mass index is 38.1 kg/m.  General: She is overweight, cooperative, alert, well developed, and in no acute distress. PSYCH: Has normal mood, affect and thought process.   Extremities: No edema.  Neurologic: No gross sensory or motor deficits. No tremors or fasciculations noted.    DIAGNOSTIC DATA REVIEWED:  BMET    Component Value Date/Time   NA 138 01/14/2023 0756   K 4.1 01/14/2023 0756   CL 105 01/14/2023 0756   CO2 21 01/14/2023 0756   GLUCOSE 87 01/14/2023 0756   GLUCOSE 95 04/21/2018 2111   BUN 13 01/14/2023 0756   CREATININE 0.82 01/14/2023 0756   CALCIUM 9.3 01/14/2023 0756   GFRNONAA 90 03/10/2020 1124  GFRAA 104 03/10/2020 1124   Lab Results  Component Value Date   HGBA1C 5.6 01/14/2023   HGBA1C 5.5 03/27/2020   Lab Results  Component Value Date   INSULIN 18.1 01/14/2023   INSULIN 13.2 03/27/2020   Lab Results  Component Value Date   TSH 1.790 01/14/2023   CBC    Component Value Date/Time   WBC 6.2 05/08/2022 0823   WBC 8.4 11/13/2015 1601   RBC 4.03 05/08/2022 0823   RBC 4.12 11/13/2015 1601   HGB 12.3 05/08/2022 0823   HCT 36.2 05/08/2022 0823   PLT 336 05/08/2022 0823   MCV 90 05/08/2022 0823   MCH 30.5 05/08/2022 0823   MCH 29.1 11/13/2015 1601   MCHC 34.0 05/08/2022 0823   MCHC 33.0  11/13/2015 1601   RDW 11.8 05/08/2022 0823   Iron Studies    Component Value Date/Time   IRON 59 05/08/2022 0823   TIBC 291 08/07/2021 1557   FERRITIN 110 05/08/2022 0823   IRONPCTSAT 20 08/07/2021 1557   Lipid Panel     Component Value Date/Time   CHOL 145 01/14/2023 0756   TRIG 55 01/14/2023 0756   HDL 61 01/14/2023 0756   LDLCALC 72 01/14/2023 0756   Hepatic Function Panel     Component Value Date/Time   PROT 7.0 01/14/2023 0756   ALBUMIN 4.2 01/14/2023 0756   AST 13 01/14/2023 0756   ALT 29 01/14/2023 0756   ALKPHOS 71 01/14/2023 0756   BILITOT 0.8 01/14/2023 0756      Component Value Date/Time   TSH 1.790 01/14/2023 0756   Nutritional Lab Results  Component Value Date   VD25OH 39.4 01/14/2023   VD25OH 28.8 (L) 05/08/2022   VD25OH 55.6 08/07/2021     ASSESSMENT AND PLAN  TREATMENT PLAN FOR OBESITY:  Recommended Dietary Goals  Brittany Elliott is currently in the action stage of change. As such, her goal is to continue weight management plan. She has agreed to the Category 3 Plan.  Behavioral Intervention  We discussed the following Behavioral Modification Strategies today: increasing lean protein intake, decreasing simple carbohydrates , increasing vegetables, increasing lower glycemic fruits, increasing water intake, work on meal planning and preparation, reading food labels , keeping healthy foods at home, continue to practice mindfulness when eating, and planning for success.  Additional resources provided today: NA  Recommended Physical Activity Goals  Brittany Elliott has been advised to work up to 150 minutes of moderate intensity aerobic activity a week and strengthening exercises 2-3 times per week for cardiovascular health, weight loss maintenance and preservation of muscle mass.   She has agreed to Think about ways to increase daily physical activity and overcoming barriers to exercise, Increase physical activity in their day and reduce sedentary time  (increase NEAT)., and Work on scheduling and tracking physical activity.    Pharmacotherapy We discussed various medication options to help Brittany Elliott with her weight loss efforts and we both agreed to increase Qsymia to dose 3.  Side effects discussed.  Patient has a nuvaring for birth control.   ASSOCIATED CONDITIONS ADDRESSED TODAY  Action/Plan  Essential hypertension -     hydroCHLOROthiazide; Take 1 tablet (25 mg total) by mouth daily.  Dispense: 30 tablet; Refill: 0  Vitamin D deficiency -     Vitamin D (Ergocalciferol); Take 1 capsule (50,000 Units total) by mouth once a week.  Dispense: 5 capsule; Refill: 0  Generalized obesity -     Qsymia; Take 1 tablet by mouth daily.  Dispense: 30  capsule; Refill: 0  BMI 38.0-38.9,adult -     Qsymia; Take 1 tablet by mouth daily.  Dispense: 30 capsule; Refill: 0      Labs reviewed in chart from 01/14/23   Return in about 4 weeks (around 03/20/2023).Marland Kitchen She was informed of the importance of frequent follow up visits to maximize her success with intensive lifestyle modifications for her multiple health conditions.   ATTESTASTION STATEMENTS:  Reviewed by clinician on day of visit: allergies, medications, problem list, medical history, surgical history, family history, social history, and previous encounter notes.    Brittany Sato. Takasha Vetere FNP-C

## 2023-03-19 ENCOUNTER — Ambulatory Visit: Payer: BC Managed Care – PPO | Admitting: Nurse Practitioner

## 2023-04-02 ENCOUNTER — Encounter: Payer: Self-pay | Admitting: Nurse Practitioner

## 2023-04-02 ENCOUNTER — Ambulatory Visit: Payer: BC Managed Care – PPO | Admitting: Nurse Practitioner

## 2023-04-02 VITALS — BP 138/83 | HR 85 | Temp 98.7°F | Ht 69.0 in | Wt 260.0 lb

## 2023-04-02 DIAGNOSIS — R632 Polyphagia: Secondary | ICD-10-CM

## 2023-04-02 DIAGNOSIS — I10 Essential (primary) hypertension: Secondary | ICD-10-CM

## 2023-04-02 DIAGNOSIS — Z6838 Body mass index (BMI) 38.0-38.9, adult: Secondary | ICD-10-CM

## 2023-04-02 DIAGNOSIS — E559 Vitamin D deficiency, unspecified: Secondary | ICD-10-CM

## 2023-04-02 DIAGNOSIS — E669 Obesity, unspecified: Secondary | ICD-10-CM

## 2023-04-02 MED ORDER — VITAMIN D (ERGOCALCIFEROL) 1.25 MG (50000 UNIT) PO CAPS
50000.0000 [IU] | ORAL_CAPSULE | ORAL | 0 refills | Status: DC
Start: 2023-04-02 — End: 2023-04-30

## 2023-04-02 MED ORDER — INSULIN PEN NEEDLE 31G X 5 MM MISC
0 refills | Status: AC
Start: 2023-04-02 — End: ?

## 2023-04-02 MED ORDER — LIRAGLUTIDE 18 MG/3ML ~~LOC~~ SOPN: 0.6000 mg | PEN_INJECTOR | Freq: Every day | SUBCUTANEOUS | 0 refills | Status: DC

## 2023-04-02 MED ORDER — HYDROCHLOROTHIAZIDE 25 MG PO TABS
25.0000 mg | ORAL_TABLET | Freq: Every day | ORAL | 0 refills | Status: DC
Start: 2023-04-02 — End: 2023-06-12

## 2023-04-02 NOTE — Patient Instructions (Signed)

## 2023-04-02 NOTE — Progress Notes (Signed)
Office: [phone]  /  Fax: [phone]  WEIGHT SUMMARY AND BIOMETRICS  Weight Lost Since Last Visit: 0lb  Weight Gained Since Last Visit: 2lb   Vitals Temp: 98.7 F (37.1 C) BP: 138/83 Pulse Rate: 85 SpO2: 100 %   Anthropometric Measurements Height: 5\' 9"  (1.753 m) Weight: 260 lb (117.9 kg) BMI (Calculated): 38.38 Weight at Last Visit: 258lb Weight Lost Since Last Visit: 0lb Weight Gained Since Last Visit: 2lb Starting Weight: 245lb Total Weight Loss (lbs): 0 lb (0 kg)   Body Composition  Body Fat %: 43.5 % Fat Mass (lbs): 113.2 lbs Muscle Mass (lbs): 139.8 lbs Total Body Water (lbs): 101.8 lbs Visceral Fat Rating : 11   Other Clinical Data Fasting: No Labs: No Today's Visit #: 100 Starting Date: 03/27/20     HPI  Chief Complaint: OBESITY  Brittany Elliott is here to discuss her progress with her obesity treatment plan. She is on the the Category 3 Plan and states she is following her eating plan approximately 30 % of the time. She states she is exercising 0 minutes 0 days per week.   Interval History:  Since last office visit she has gained 2 pounds. She is eating 2 meals and occ snack.  She is meal prepping but skips meals due to her work schedule.  She finds she is too busy to eat while at work.  She is drinking water daily.     Pharmacotherapy for weight loss: She is currently taking Qsymia dose 3 for medical weight loss.  Denies side effects.    Previous pharmacotherapy for medical weight loss:  She has tried [name], [place], [place] and Topamax. She stopped Topamax due to daytime sleepiness. Feels Wegovy worked better for her by helping reduce her appetite and cravings more and she lost more weight while taking it. She was unable to continue [name] and [name] due to cost.    Bariatric surgery:  Patient has not had bariatric surgery.   Vit D deficiency  She is taking Vit D 50,000 IU weekly.  Denies side effects.  Denies nausea, vomiting or muscle  weakness.    Lab Results  Component Value Date   VD25OH 39.4 01/14/2023   VD25OH 28.8 (L) 05/08/2022   VD25OH 55.6 08/07/2021     Hypertension Hypertension stable.  Medication(s): hydrochlorothiazide 25mg . Denies side effects.   Denies chest pain, palpitations and SOB.  BP Readings from Last 3 Encounters:  04/02/23 138/83  02/20/23 138/85  01/14/23 123/78   Lab Results  Component Value Date   CREATININE 0.82 01/14/2023   CREATININE 0.90 05/08/2022   CREATININE 0.77 05/07/2021     PHYSICAL EXAM:  Blood pressure 138/83, pulse 85, temperature 98.7 F (37.1 C), height 5\' 9"  (1.753 m), weight 260 lb (117.9 kg), SpO2 100%. Body mass index is 38.4 kg/m.  General: She is overweight, cooperative, alert, well developed, and in no acute distress. PSYCH: Has normal mood, affect and thought process.   Extremities: No edema.  Neurologic: No gross sensory or motor deficits. No tremors or fasciculations noted.    DIAGNOSTIC DATA REVIEWED:  BMET    Component Value Date/Time   NA 138 01/14/2023 0756   K 4.1 01/14/2023 0756   CL 105 01/14/2023 0756   CO2 21 01/14/2023 0756   GLUCOSE 87 01/14/2023 0756   GLUCOSE 95 04/21/2018 2111   BUN 13 01/14/2023 0756   CREATININE 0.82 01/14/2023 0756   CALCIUM 9.3 01/14/2023 0756   GFRNONAA 90 03/10/2020 1124   GFRAA  104 03/10/2020 1124   Lab Results  Component Value Date   HGBA1C 5.6 01/14/2023   HGBA1C 5.5 03/27/2020   Lab Results  Component Value Date   INSULIN 18.1 01/14/2023   INSULIN 13.2 03/27/2020   Lab Results  Component Value Date   TSH 1.790 01/14/2023   CBC    Component Value Date/Time   WBC 6.2 05/08/2022 0823   WBC 8.4 11/13/2015 1601   RBC 4.03 05/08/2022 0823   RBC 4.12 11/13/2015 1601   HGB 12.3 05/08/2022 0823   HCT 36.2 05/08/2022 0823   PLT 336 05/08/2022 0823   MCV 90 05/08/2022 0823   MCH 30.5 05/08/2022 0823   MCH 29.1 11/13/2015 1601   MCHC 34.0 05/08/2022 0823   MCHC 33.0 11/13/2015 1601    RDW 11.8 05/08/2022 0823   Iron Studies    Component Value Date/Time   IRON 59 05/08/2022 0823   TIBC 291 08/07/2021 1557   FERRITIN 110 05/08/2022 0823   IRONPCTSAT 20 08/07/2021 1557   Lipid Panel     Component Value Date/Time   CHOL 145 01/14/2023 0756   TRIG 55 01/14/2023 0756   HDL 61 01/14/2023 0756   LDLCALC 72 01/14/2023 0756   Hepatic Function Panel     Component Value Date/Time   PROT 7.0 01/14/2023 0756   ALBUMIN 4.2 01/14/2023 0756   AST 13 01/14/2023 0756   ALT 29 01/14/2023 0756   ALKPHOS 71 01/14/2023 0756   BILITOT 0.8 01/14/2023 0756      Component Value Date/Time   TSH 1.790 01/14/2023 0756   Nutritional Lab Results  Component Value Date   VD25OH 39.4 01/14/2023   VD25OH 28.8 (L) 05/08/2022   VD25OH 55.6 08/07/2021     ASSESSMENT AND PLAN  TREATMENT PLAN FOR OBESITY:  Recommended Dietary Goals  Brittany Elliott is currently in the action stage of change. As such, her goal is to continue weight management plan. I've recommended she track and will review calories and macros at her next visit.  Behavioral Intervention  We discussed the following Behavioral Modification Strategies today: increasing lean protein intake to established goals, increasing water intake , work on meal planning and preparation, work on tracking and journaling calories using tracking application, and continue to work on maintaining a reduced calorie state, getting the recommended amount of protein, incorporating whole foods, making healthy choices, staying well hydrated and practicing mindfulness when eating..  Additional resources provided today: NA  Recommended Physical Activity Goals  Brittany Elliott has been advised to work up to 150 minutes of moderate intensity aerobic activity a week and strengthening exercises 2-3 times per week for cardiovascular health, weight loss maintenance and preservation of muscle mass.   She has agreed to Think about enjoyable ways to increase daily  physical activity and overcoming barriers to exercise, Increase physical activity in their day and reduce sedentary time (increase NEAT)., and Work on scheduling and tracking physical activity.    Pharmacotherapy We discussed various medication options to help Brittany Elliott with her weight loss efforts and we both agreed to stop Qsymia and start Victoza 0.6mg  off label for weight loss.  Side effects discussed. Patient has nuvaring for birth control.    Contraindications:  Pancreatitis (active gallstones) Medullary thyroid cancer High triglycerides (>500)-will need labs prior to starting Multiple Endocrine Neoplasia syndrome type 2 (MEN 2) Trying to get pregnant Breastfeeding Use with caution with taking insulin or sulfonylureas (will need to monitor blood sugars for hypoglycemia)  ASSOCIATED CONDITIONS ADDRESSED TODAY  Action/Plan  Vitamin D deficiency -     Vitamin D (Ergocalciferol); Take 1 capsule (50,000 Units total) by mouth once a week.  Dispense: 5 capsule; Refill: 0  Essential hypertension -     hydroCHLOROthiazide; Take 1 tablet (25 mg total) by mouth daily.  Dispense: 30 tablet; Refill: 0  Polyphagia -     Liraglutide; Inject 0.6 mg into the skin daily.  Dispense: 3 mL; Refill: 0 -     Insulin Pen Needle; Use as directed with Victoza  Dispense: 50 each; Refill: 0  Generalized obesity -     Liraglutide; Inject 0.6 mg into the skin daily.  Dispense: 3 mL; Refill: 0 -     Insulin Pen Needle; Use as directed with Victoza  Dispense: 50 each; Refill: 0  BMI 38.0-38.9,adult         Return in about 2 weeks (around 04/16/2023).Marland Kitchen She was informed of the importance of frequent follow up visits to maximize her success with intensive lifestyle modifications for her multiple health conditions.   ATTESTASTION STATEMENTS:  Reviewed by clinician on day of visit: allergies, medications, problem list, medical history, surgical history, family history, social history, and previous  encounter notes.     Theodis Sato. Brittany Rau FNP-C

## 2023-04-16 ENCOUNTER — Ambulatory Visit: Payer: BC Managed Care – PPO | Admitting: Nurse Practitioner

## 2023-04-27 ENCOUNTER — Other Ambulatory Visit: Payer: Self-pay | Admitting: Nurse Practitioner

## 2023-04-27 DIAGNOSIS — E669 Obesity, unspecified: Secondary | ICD-10-CM

## 2023-04-27 DIAGNOSIS — R632 Polyphagia: Secondary | ICD-10-CM

## 2023-04-29 ENCOUNTER — Other Ambulatory Visit: Payer: Self-pay | Admitting: Nurse Practitioner

## 2023-04-29 DIAGNOSIS — I10 Essential (primary) hypertension: Secondary | ICD-10-CM

## 2023-04-30 ENCOUNTER — Ambulatory Visit: Payer: BC Managed Care – PPO | Admitting: Nurse Practitioner

## 2023-04-30 ENCOUNTER — Encounter: Payer: Self-pay | Admitting: Nurse Practitioner

## 2023-04-30 VITALS — BP 135/87 | HR 88 | Temp 98.7°F | Ht 69.0 in | Wt 265.0 lb

## 2023-04-30 DIAGNOSIS — Z6839 Body mass index (BMI) 39.0-39.9, adult: Secondary | ICD-10-CM

## 2023-04-30 DIAGNOSIS — R632 Polyphagia: Secondary | ICD-10-CM | POA: Diagnosis not present

## 2023-04-30 DIAGNOSIS — E88819 Insulin resistance, unspecified: Secondary | ICD-10-CM

## 2023-04-30 DIAGNOSIS — E559 Vitamin D deficiency, unspecified: Secondary | ICD-10-CM

## 2023-04-30 DIAGNOSIS — E669 Obesity, unspecified: Secondary | ICD-10-CM

## 2023-04-30 MED ORDER — LIRAGLUTIDE 18 MG/3ML ~~LOC~~ SOPN: 1.2000 mg | PEN_INJECTOR | Freq: Every day | SUBCUTANEOUS | 0 refills | Status: DC

## 2023-04-30 MED ORDER — VITAMIN D (ERGOCALCIFEROL) 1.25 MG (50000 UNIT) PO CAPS: 50000.0000 [IU] | ORAL_CAPSULE | ORAL | 0 refills | Status: DC

## 2023-04-30 MED ORDER — METFORMIN HCL 500 MG PO TABS: 500.0000 mg | ORAL_TABLET | Freq: Every day | ORAL | 0 refills | Status: DC

## 2023-04-30 NOTE — Progress Notes (Addendum)
Office: [phone]  /  Fax: [phone]  WEIGHT SUMMARY AND BIOMETRICS  Weight Lost Since Last Visit: 0lb  Weight Gained Since Last Visit: 5lb   Vitals Temp: 98.7 F (37.1 C) BP: 135/87 Pulse Rate: 88 SpO2: 100 %   Anthropometric Measurements Height: 5\' 9"  (1.753 m) Weight: 265 lb (120.2 kg) BMI (Calculated): 39.12 Weight at Last Visit: 260lb Weight Lost Since Last Visit: 0lb Weight Gained Since Last Visit: 5lb Starting Weight: 245lb Total Weight Loss (lbs): 0 lb (0 kg)   Body Composition  Body Fat %: 44.8 % Fat Mass (lbs): 119 lbs Muscle Mass (lbs): 139.4 lbs Total Body Water (lbs): 104 lbs Visceral Fat Rating : 11   Other Clinical Data Fasting: No Labs: No Today's Visit #: 40 Starting Date: 03/27/20     HPI  Chief Complaint: OBESITY  Brittany Elliott is here to discuss her progress with her obesity treatment plan. She is on the the Category 3 Plan and states she is following her eating plan approximately 30 % of the time. She states she is exercising 40 minutes 3 days per week.   Interval History:  Since last office visit she has gained 5 pounds.  She started going back to the gym this week and plans on going 4 days per week.    Her highest weight was 303 lbs and nadir weight was 229 lbs.   Pharmacotherapy for weight loss: She is currently taking Victoza 0.6mg  off label for medical weight loss.  Denies side effects.     Previous pharmacotherapy for medical weight loss:   She has tried [name], [place], [place], Qsymia and Topamax.  She stopped Topamax due to daytime sleepiness.  Feels Wegovy worked better for her by helping reduce her appetite and cravings more and she lost more weight while taking it.  She was unable to continue [name] and [name] due to cost.    Bariatric surgery:  Patient has not had bariatric surgery.     Insulin Resistance Last fasting insulin was 18.1. A1c was 5.6. Polyphagia:Yes and cravings Medication(s): None FH D2:   mother, sister, mgm Lab Results  Component Value Date   HGBA1C 5.6 01/14/2023   HGBA1C 5.3 05/08/2022   HGBA1C 5.4 05/07/2021   HGBA1C 5.4 01/11/2021   HGBA1C 5.5 03/27/2020   Lab Results  Component Value Date   INSULIN 18.1 01/14/2023   INSULIN 12.7 05/08/2022   INSULIN 18.8 05/07/2021   INSULIN 14.2 01/11/2021   INSULIN 13.2 03/27/2020      PHYSICAL EXAM:  Blood pressure 135/87, pulse 88, temperature 98.7 F (37.1 C), height 5\' 9"  (1.753 m), weight 265 lb (120.2 kg), SpO2 100%. Body mass index is 39.13 kg/m.  General: She is overweight, cooperative, alert, well developed, and in no acute distress. PSYCH: Has normal mood, affect and thought process.   Extremities: No edema.  Neurologic: No gross sensory or motor deficits. No tremors or fasciculations noted.    DIAGNOSTIC DATA REVIEWED:  BMET    Component Value Date/Time   NA 138 01/14/2023 0756   K 4.1 01/14/2023 0756   CL 105 01/14/2023 0756   CO2 21 01/14/2023 0756   GLUCOSE 87 01/14/2023 0756   GLUCOSE 95 04/21/2018 2111   BUN 13 01/14/2023 0756   CREATININE 0.82 01/14/2023 0756   CALCIUM 9.3 01/14/2023 0756   GFRNONAA 90 03/10/2020 1124   GFRAA 104 03/10/2020 1124   Lab Results  Component Value Date   HGBA1C 5.6 01/14/2023   HGBA1C 5.5 03/27/2020  Lab Results  Component Value Date   INSULIN 18.1 01/14/2023   INSULIN 13.2 03/27/2020   Lab Results  Component Value Date   TSH 1.790 01/14/2023   CBC    Component Value Date/Time   WBC 6.2 05/08/2022 0823   WBC 8.4 11/13/2015 1601   RBC 4.03 05/08/2022 0823   RBC 4.12 11/13/2015 1601   HGB 12.3 05/08/2022 0823   HCT 36.2 05/08/2022 0823   PLT 336 05/08/2022 0823   MCV 90 05/08/2022 0823   MCH 30.5 05/08/2022 0823   MCH 29.1 11/13/2015 1601   MCHC 34.0 05/08/2022 0823   MCHC 33.0 11/13/2015 1601   RDW 11.8 05/08/2022 0823   Iron Studies    Component Value Date/Time   IRON 59 05/08/2022 0823   TIBC 291 08/07/2021 1557   FERRITIN 110  05/08/2022 0823   IRONPCTSAT 20 08/07/2021 1557   Lipid Panel     Component Value Date/Time   CHOL 145 01/14/2023 0756   TRIG 55 01/14/2023 0756   HDL 61 01/14/2023 0756   LDLCALC 72 01/14/2023 0756   Hepatic Function Panel     Component Value Date/Time   PROT 7.0 01/14/2023 0756   ALBUMIN 4.2 01/14/2023 0756   AST 13 01/14/2023 0756   ALT 29 01/14/2023 0756   ALKPHOS 71 01/14/2023 0756   BILITOT 0.8 01/14/2023 0756      Component Value Date/Time   TSH 1.790 01/14/2023 0756   Nutritional Lab Results  Component Value Date   VD25OH 39.4 01/14/2023   VD25OH 28.8 (L) 05/08/2022   VD25OH 55.6 08/07/2021     ASSESSMENT AND PLAN  TREATMENT PLAN FOR OBESITY:  Recommended Dietary Goals  Brittany Elliott is currently in the action stage of change. As such, her goal is to continue weight management plan. She has agreed to keeping a food journal and adhering to recommended goals of 1500 calories and 80+ protein.  Behavioral Intervention  We discussed the following Behavioral Modification Strategies today: increasing lean protein intake to established goals, work on meal planning and preparation, work on Psychologist, counselling, planning for success, celebration eating strategies, and continue to work on maintaining a reduced calorie state, getting the recommended amount of protein, incorporating whole foods, making healthy choices, staying well hydrated and practicing mindfulness when eating..  Additional resources provided today: NA  Recommended Physical Activity Goals  Brittany Elliott has been advised to work up to 150 minutes of moderate intensity aerobic activity a week and strengthening exercises 2-3 times per week for cardiovascular health, weight loss maintenance and preservation of muscle mass.   She has agreed to Continue current level of physical activity    Pharmacotherapy We discussed various medication options to help Brittany Elliott with her  weight loss efforts and we both agreed to increase Victoza to 1.2mg .  Side effects discussed.  Using off label for weight loss.    ASSOCIATED CONDITIONS ADDRESSED TODAY  Action/Plan  Insulin resistance -     Start metFORMIN HCl; Take 1 tablet (500 mg total) by mouth daily with breakfast.  Dispense: 30 tablet; Refill: 0.  Side effects discussed.   Brittany Elliott will continue to work on weight loss, exercise, and decreasing simple carbohydrates to help decrease the risk of diabetes. Floyd agreed to follow-up with [name] as directed to closely monitor her progress.   Polyphagia -     Increase Liraglutide; Inject 1.2 mg into the skin daily.  Dispense: 6 mL; Refill: 0. Side effects discussed  Vitamin D  deficiency -     Continue Vitamin D (Ergocalciferol); Take 1 capsule (50,000 Units total) by mouth once a week.  Dispense: 5 capsule; Refill: 0  Generalized obesity -     Liraglutide; Inject 1.2 mg into the skin daily.  Dispense: 6 mL; Refill: 0  BMI 39.0-39.9,adult     Multiple options discussed today:  Increasing Victoza dose Adding Metformin Retrying Qsymia Zepbound Wegovy    Return in about 4 weeks (around 05/28/2023).Marland Kitchen She was informed of the importance of frequent follow up visits to maximize her success with intensive lifestyle modifications for her multiple health conditions.   ATTESTASTION STATEMENTS:  Reviewed by clinician on day of visit: allergies, medications, problem list, medical history, surgical history, family history, social history, and previous encounter notes.    Theodis Sato. Kirkland Figg FNP-C

## 2023-05-15 ENCOUNTER — Ambulatory Visit: Payer: BC Managed Care – PPO | Admitting: Nurse Practitioner

## 2023-05-22 ENCOUNTER — Encounter: Payer: Self-pay | Admitting: Nurse Practitioner

## 2023-05-22 ENCOUNTER — Ambulatory Visit: Payer: BC Managed Care – PPO | Admitting: Nurse Practitioner

## 2023-05-22 VITALS — BP 130/82 | HR 94 | Temp 98.0°F | Ht 69.0 in | Wt 271.0 lb

## 2023-05-22 DIAGNOSIS — E88819 Insulin resistance, unspecified: Secondary | ICD-10-CM

## 2023-05-22 DIAGNOSIS — E559 Vitamin D deficiency, unspecified: Secondary | ICD-10-CM | POA: Diagnosis not present

## 2023-05-22 DIAGNOSIS — Z6841 Body Mass Index (BMI) 40.0 and over, adult: Secondary | ICD-10-CM

## 2023-05-22 MED ORDER — VITAMIN D (ERGOCALCIFEROL) 1.25 MG (50000 UNIT) PO CAPS: 50000.0000 [IU] | ORAL_CAPSULE | ORAL | 0 refills | Status: DC

## 2023-05-22 NOTE — Progress Notes (Signed)
Office: [phone]  /  Fax: [phone]  WEIGHT SUMMARY AND BIOMETRICS  Weight Lost Since Last Visit: 0lb  Weight Gained Since Last Visit: 6lb   Vitals Temp: 98 F (36.7 C) BP: 130/82 Pulse Rate: 94 SpO2: 99 %   Anthropometric Measurements Height: 5\' 9"  (1.753 m) Weight: 271 lb (122.9 kg) BMI (Calculated): 40 Weight at Last Visit: 265lb Weight Lost Since Last Visit: 0lb Weight Gained Since Last Visit: 6lb Starting Weight: 245lb Total Weight Loss (lbs): 0 lb (0 kg)   Body Composition  Body Fat %: 45.9 % Fat Mass (lbs): 124.4 lbs Muscle Mass (lbs): 139.2 lbs Total Body Water (lbs): 107.8 lbs Visceral Fat Rating : 12   Other Clinical Data Fasting: No Labs: No Today's Visit #: 41 Starting Date: 03/27/20     HPI  Chief Complaint: OBESITY  Brittany Elliott is here to discuss her progress with her obesity treatment plan. She is on the the Category 3 Plan and states she is following her eating plan approximately 40 % of the time. She states she is exercising 0 minutes 0 days per week.   Interval History:  Since last office visit she has gained 6 pounds.  She has been working longer hours and hasn't been as consistent with taking her medications and exercising. She is drinking water and juice.    Her highest weight was 303 lbs and nadir weight was 229 lbs.    Pharmacotherapy for weight loss: She is currently taking Victoza 1.2 mg (taking every 3 days) off label for medical weight loss.  Denies side effects.  Not taking consistently     Previous pharmacotherapy for medical weight loss:   She has tried [place], Wegovy, Qsymia and Topamax.  -She stopped Topamax due to daytime sleepiness.  -Feels Wegovy worked better for her by helping reduce her appetite and cravings more and she lost more weight while taking it.  -She was unable to continue [name] and [name] due to cost.    Bariatric surgery:  Patient has not had bariatric surgery.      Insulin  Resistance Last fasting insulin was 18.1. A1c was 5.6. Polyphagia:Yes Medication(s): Start Metformin after her last visit.  She is taking it every 3 days-has not been taking it consistently.  Denies side effects.    Lab Results  Component Value Date   HGBA1C 5.6 01/14/2023   HGBA1C 5.3 05/08/2022   HGBA1C 5.4 05/07/2021   HGBA1C 5.4 01/11/2021   HGBA1C 5.5 03/27/2020   Lab Results  Component Value Date   INSULIN 18.1 01/14/2023   INSULIN 12.7 05/08/2022   INSULIN 18.8 05/07/2021   INSULIN 14.2 01/11/2021   INSULIN 13.2 03/27/2020    Vit D deficiency  She is taking Vit D 50,000 IU weekly.  Denies side effects.  Denies nausea, vomiting or muscle weakness.    Lab Results  Component Value Date   VD25OH 39.4 01/14/2023   VD25OH 28.8 (L) 05/08/2022   VD25OH 55.6 08/07/2021     PHYSICAL EXAM:  Blood pressure 130/82, pulse 94, temperature 98 F (36.7 C), height 5\' 9"  (1.753 m), weight 271 lb (122.9 kg), SpO2 99%. Body mass index is 40.02 kg/m.  General: She is overweight, cooperative, alert, well developed, and in no acute distress. PSYCH: Has normal mood, affect and thought process.   Extremities: No edema.  Neurologic: No gross sensory or motor deficits. No tremors or fasciculations noted.    DIAGNOSTIC DATA REVIEWED:  BMET    Component Value Date/Time  NA 138 01/14/2023 0756   K 4.1 01/14/2023 0756   CL 105 01/14/2023 0756   CO2 21 01/14/2023 0756   GLUCOSE 87 01/14/2023 0756   GLUCOSE 95 04/21/2018 2111   BUN 13 01/14/2023 0756   CREATININE 0.82 01/14/2023 0756   CALCIUM 9.3 01/14/2023 0756   GFRNONAA 90 03/10/2020 1124   GFRAA 104 03/10/2020 1124   Lab Results  Component Value Date   HGBA1C 5.6 01/14/2023   HGBA1C 5.5 03/27/2020   Lab Results  Component Value Date   INSULIN 18.1 01/14/2023   INSULIN 13.2 03/27/2020   Lab Results  Component Value Date   TSH 1.790 01/14/2023   CBC    Component Value Date/Time   WBC 6.2 05/08/2022 0823   WBC  8.4 11/13/2015 1601   RBC 4.03 05/08/2022 0823   RBC 4.12 11/13/2015 1601   HGB 12.3 05/08/2022 0823   HCT 36.2 05/08/2022 0823   PLT 336 05/08/2022 0823   MCV 90 05/08/2022 0823   MCH 30.5 05/08/2022 0823   MCH 29.1 11/13/2015 1601   MCHC 34.0 05/08/2022 0823   MCHC 33.0 11/13/2015 1601   RDW 11.8 05/08/2022 0823   Iron Studies    Component Value Date/Time   IRON 59 05/08/2022 0823   TIBC 291 08/07/2021 1557   FERRITIN 110 05/08/2022 0823   IRONPCTSAT 20 08/07/2021 1557   Lipid Panel     Component Value Date/Time   CHOL 145 01/14/2023 0756   TRIG 55 01/14/2023 0756   HDL 61 01/14/2023 0756   LDLCALC 72 01/14/2023 0756   Hepatic Function Panel     Component Value Date/Time   PROT 7.0 01/14/2023 0756   ALBUMIN 4.2 01/14/2023 0756   AST 13 01/14/2023 0756   ALT 29 01/14/2023 0756   ALKPHOS 71 01/14/2023 0756   BILITOT 0.8 01/14/2023 0756      Component Value Date/Time   TSH 1.790 01/14/2023 0756   Nutritional Lab Results  Component Value Date   VD25OH 39.4 01/14/2023   VD25OH 28.8 (L) 05/08/2022   VD25OH 55.6 08/07/2021     ASSESSMENT AND PLAN  TREATMENT PLAN FOR OBESITY:  Recommended Dietary Goals  Brittany Elliott is currently in the action stage of change. As such, her goal is to continue weight management plan. She has agreed to the Category 3 Plan + 100 calories-aim for around 1600 calories and 90+ grams of protein. To track and will review calories, fat, carbs and protein.    Behavioral Intervention  We discussed the following Behavioral Modification Strategies today: increasing lean protein intake to established goals, decreasing simple carbohydrates , increasing vegetables, increasing water intake , celebration eating strategies, and continue to work on maintaining a reduced calorie state, getting the recommended amount of protein, incorporating whole foods, making healthy choices, staying well hydrated and practicing mindfulness when  eating..  Additional resources provided today: NA  Recommended Physical Activity Goals  Brittany Elliott has been advised to work up to 150 minutes of moderate intensity aerobic activity a week and strengthening exercises 2-3 times per week for cardiovascular health, weight loss maintenance and preservation of muscle mass.   She has agreed to Think about enjoyable ways to increase daily physical activity and overcoming barriers to exercise, Increase physical activity in their day and reduce sedentary time (increase NEAT)., Start strengthening exercises with a goal of 2-3 sessions a week , and Start aerobic activity with a goal of 150 minutes a week at moderate intensity.    Pharmacotherapy We discussed  various medication options to help Brittany Elliott with her weight loss efforts and we both agreed to continue Metformin and Victoza off label for weight loss.  Side effects discussed.  ASSOCIATED CONDITIONS ADDRESSED TODAY  Action/Plan  Insulin resistance To start taking Metformin daily.  Side effects discussed  Vitamin D deficiency -     Continue Vitamin D (Ergocalciferol); Take 1 capsule (50,000 Units total) by mouth once a week.  Dispense: 5 capsule; Refill: 0  Morbid obesity (HCC) Discussed bariatric surgery.  Patient not currently interested    BMI 40.0-44.9, adult [facility])     Goals:  Be more consistent with taking Metformin and Victoza.   Start going back to the gym Track Drink water   Return in about 3 weeks (around 06/12/2023).Marland Kitchen She was informed of the importance of frequent follow up visits to maximize her success with intensive lifestyle modifications for her multiple health conditions.   ATTESTASTION STATEMENTS:  Reviewed by clinician on day of visit: allergies, medications, problem list, medical history, surgical history, family history, social history, and previous encounter notes.     Brittany Elliott. Antoinne Spadaccini FNP-C

## 2023-05-27 ENCOUNTER — Other Ambulatory Visit: Payer: Self-pay | Admitting: Nurse Practitioner

## 2023-05-27 DIAGNOSIS — R632 Polyphagia: Secondary | ICD-10-CM

## 2023-05-27 DIAGNOSIS — E669 Obesity, unspecified: Secondary | ICD-10-CM

## 2023-05-30 ENCOUNTER — Other Ambulatory Visit: Payer: Self-pay | Admitting: Nurse Practitioner

## 2023-05-30 DIAGNOSIS — E88819 Insulin resistance, unspecified: Secondary | ICD-10-CM

## 2023-06-04 ENCOUNTER — Other Ambulatory Visit: Payer: Self-pay | Admitting: Nurse Practitioner

## 2023-06-04 DIAGNOSIS — E559 Vitamin D deficiency, unspecified: Secondary | ICD-10-CM

## 2023-06-12 ENCOUNTER — Encounter: Payer: Self-pay | Admitting: Nurse Practitioner

## 2023-06-12 ENCOUNTER — Ambulatory Visit: Payer: 59 | Admitting: Nurse Practitioner

## 2023-06-12 VITALS — BP 138/88 | HR 115 | Temp 98.2°F | Ht 69.0 in | Wt 275.0 lb

## 2023-06-12 DIAGNOSIS — I10 Essential (primary) hypertension: Secondary | ICD-10-CM | POA: Diagnosis not present

## 2023-06-12 DIAGNOSIS — E88819 Insulin resistance, unspecified: Secondary | ICD-10-CM | POA: Diagnosis not present

## 2023-06-12 DIAGNOSIS — Z6841 Body Mass Index (BMI) 40.0 and over, adult: Secondary | ICD-10-CM

## 2023-06-12 MED ORDER — ZEPBOUND 2.5 MG/0.5ML ~~LOC~~ SOLN: 2.5000 mg | SUBCUTANEOUS | 0 refills | Status: DC

## 2023-06-12 MED ORDER — HYDROCHLOROTHIAZIDE 25 MG PO TABS: 25.0000 mg | ORAL_TABLET | Freq: Every day | ORAL | 0 refills | Status: DC

## 2023-06-12 NOTE — Patient Instructions (Signed)

## 2023-06-12 NOTE — Progress Notes (Signed)
Office: [phone]  /  Fax: [phone]  WEIGHT SUMMARY AND BIOMETRICS  Weight Lost Since Last Visit: 0lb  Weight Gained Since Last Visit: 4lb   Vitals Temp: 98.2 F (36.8 C) BP: (!) 165/111 Pulse Rate: (!) 115 SpO2: 98 %   Anthropometric Measurements Height: 5\' 9"  (1.753 m) Weight: 275 lb (124.7 kg) BMI (Calculated): 40.59 Weight at Last Visit: 271lb Weight Lost Since Last Visit: 0lb Weight Gained Since Last Visit: 4lb Starting Weight: 245lb Total Weight Loss (lbs): 0 lb (0 kg)   Body Composition  Body Fat %: 45.6 % Fat Mass (lbs): 125.8 lbs Muscle Mass (lbs): 142.4 lbs Total Body Water (lbs): 105.6 lbs Visceral Fat Rating : 12   Other Clinical Data Fasting: No Labs: No Today's Visit #: 42 Starting Date: 03/27/20     HPI  Chief Complaint: OBESITY  Brittany Elliott is here to discuss her progress with her obesity treatment plan. She is on the the Category 3 Plan and states she is following her eating plan approximately 30 % of the time. She states she is exercising 30 minutes 4 days per week.   Interval History:  Since last office visit she has gained 4 pounds.  She doesn't feel that things are going well. She hasn't been able to go to the gym due to the weather and being stuck in the house.  Has questions today about starting Zepbound.  Not currently interested in bariatric surgery.   Her highest weight was 303 lbs and nadir weight was 229 lbs.    Pharmacotherapy for weight loss: She is currently taking Victoza 1.2 mg off label for medical weight loss.  Denies side effects.  Still not taking consistently     Previous pharmacotherapy for medical weight loss:   She has tried [place], Wegovy, Qsymia and Topamax.  -She stopped Topamax due to daytime sleepiness.  -Feels Wegovy worked better for her by helping reduce her appetite and cravings more and she lost more weight while taking it.  -She was unable to continue [name] and [name] due to cost.     Bariatric surgery:  Patient has not had bariatric surgery.   Insulin Resistance Last fasting insulin was 18.1. A1c was 5.6 on 01/14/23. Polyphagia:Yes Medication(s): Metformin 500mg  and Victoza 1.2 mg.  Notes side effects of diarrhea.    Lab Results  Component Value Date   HGBA1C 5.6 01/14/2023   HGBA1C 5.3 05/08/2022   HGBA1C 5.4 05/07/2021   HGBA1C 5.4 01/11/2021   HGBA1C 5.5 03/27/2020   Lab Results  Component Value Date   INSULIN 18.1 01/14/2023   INSULIN 12.7 05/08/2022   INSULIN 18.8 05/07/2021   INSULIN 14.2 01/11/2021   INSULIN 13.2 03/27/2020   Hypertension BP is elevated today.  She feels it's high due to rushing to get here.  Medication(s): hydrochlorothiazide 25mg .  Denies side effects.   Denies chest pain, palpitations and SOB.  BP Readings from Last 3 Encounters:  06/12/23 (!) 165/111  05/22/23 130/82  04/30/23 135/87   Lab Results  Component Value Date   CREATININE 0.82 01/14/2023   CREATININE 0.90 05/08/2022   CREATININE 0.77 05/07/2021    PHYSICAL EXAM:  Blood pressure (!) 165/111, pulse (!) 115, temperature 98.2 F (36.8 C), height 5\' 9"  (1.753 m), weight 275 lb (124.7 kg), SpO2 98%. Body mass index is 40.61 kg/m.  General: She is overweight, cooperative, alert, well developed, and in no acute distress. PSYCH: Has normal mood, affect and thought process.   Extremities: No edema.  Neurologic: No gross sensory or motor deficits. No tremors or fasciculations noted.    DIAGNOSTIC DATA REVIEWED:  BMET    Component Value Date/Time   NA 138 01/14/2023 0756   K 4.1 01/14/2023 0756   CL 105 01/14/2023 0756   CO2 21 01/14/2023 0756   GLUCOSE 87 01/14/2023 0756   GLUCOSE 95 04/21/2018 2111   BUN 13 01/14/2023 0756   CREATININE 0.82 01/14/2023 0756   CALCIUM 9.3 01/14/2023 0756   GFRNONAA 90 03/10/2020 1124   GFRAA 104 03/10/2020 1124   Lab Results  Component Value Date   HGBA1C 5.6 01/14/2023   HGBA1C 5.5 03/27/2020   Lab Results   Component Value Date   INSULIN 18.1 01/14/2023   INSULIN 13.2 03/27/2020   Lab Results  Component Value Date   TSH 1.790 01/14/2023   CBC    Component Value Date/Time   WBC 6.2 05/08/2022 0823   WBC 8.4 11/13/2015 1601   RBC 4.03 05/08/2022 0823   RBC 4.12 11/13/2015 1601   HGB 12.3 05/08/2022 0823   HCT 36.2 05/08/2022 0823   PLT 336 05/08/2022 0823   MCV 90 05/08/2022 0823   MCH 30.5 05/08/2022 0823   MCH 29.1 11/13/2015 1601   MCHC 34.0 05/08/2022 0823   MCHC 33.0 11/13/2015 1601   RDW 11.8 05/08/2022 0823   Iron Studies    Component Value Date/Time   IRON 59 05/08/2022 0823   TIBC 291 08/07/2021 1557   FERRITIN 110 05/08/2022 0823   IRONPCTSAT 20 08/07/2021 1557   Lipid Panel     Component Value Date/Time   CHOL 145 01/14/2023 0756   TRIG 55 01/14/2023 0756   HDL 61 01/14/2023 0756   LDLCALC 72 01/14/2023 0756   Hepatic Function Panel     Component Value Date/Time   PROT 7.0 01/14/2023 0756   ALBUMIN 4.2 01/14/2023 0756   AST 13 01/14/2023 0756   ALT 29 01/14/2023 0756   ALKPHOS 71 01/14/2023 0756   BILITOT 0.8 01/14/2023 0756      Component Value Date/Time   TSH 1.790 01/14/2023 0756   Nutritional Lab Results  Component Value Date   VD25OH 39.4 01/14/2023   VD25OH 28.8 (L) 05/08/2022   VD25OH 55.6 08/07/2021     ASSESSMENT AND PLAN  TREATMENT PLAN FOR OBESITY:  Recommended Dietary Goals  Brittany Elliott is currently in the action stage of change. As such, her goal is to continue weight management plan. She has agreed to track and will review at next visit.  Behavioral Intervention  We discussed the following Behavioral Modification Strategies today: increasing lean protein intake to established goals, decreasing simple carbohydrates , increasing vegetables, increasing water intake , work on meal planning and preparation, work on tracking and journaling calories using tracking application, and continue to work on maintaining a reduced calorie  state, getting the recommended amount of protein, incorporating whole foods, making healthy choices, staying well hydrated and practicing mindfulness when eating..  Additional resources provided today: NA  Recommended Physical Activity Goals  Brittany Elliott has been advised to work up to 150 minutes of moderate intensity aerobic activity a week and strengthening exercises 2-3 times per week for cardiovascular health, weight loss maintenance and preservation of muscle mass.   She has agreed to Think about enjoyable ways to increase daily physical activity and overcoming barriers to exercise, Increase physical activity in their day and reduce sedentary time (increase NEAT)., and Work on scheduling and tracking physical activity.    Pharmacotherapy We discussed  various medication options to help [facility] with her weight loss efforts and we both agreed to start Zepbound 2.5mg .  Side effects discussed.  Contraindications:  Pancreatitis (active gallstones) Medullary thyroid cancer High triglycerides (>500)-will need labs prior to starting Multiple Endocrine Neoplasia syndrome type 2 (MEN 2) Trying to get pregnant Breastfeeding Use with caution with taking insulin or sulfonylureas (will need to monitor blood sugars for hypoglycemia)  ASSOCIATED CONDITIONS ADDRESSED TODAY  Action/Plan  Insulin resistance Patient plans to stop Victoza and Metformin due to side effects  Essential hypertension -     Continue hydroCHLOROthiazide; Take 1 tablet (25 mg total) by mouth daily.  Dispense: 30 tablet; Refill: 0.  Side effects discussed.  Will continue to monitor.   Morbid obesity (HCC) -     Zepbound; Inject 2.5 mg into the skin once a week.  Dispense: 2 mL; Refill: 0  BMI 40.0-44.9, adult (HCC)         Return in about 3 weeks (around 07/03/2023).Marland Kitchen She was informed of the importance of frequent follow up visits to maximize her success with intensive lifestyle modifications for her multiple health  conditions.   ATTESTASTION STATEMENTS:  Reviewed by clinician on day of visit: allergies, medications, problem list, medical history, surgical history, family history, social history, and previous encounter notes.     Theodis Sato. Marshal Eskew FNP-C

## 2023-06-26 ENCOUNTER — Other Ambulatory Visit: Payer: Self-pay | Admitting: Nurse Practitioner

## 2023-06-26 DIAGNOSIS — I10 Essential (primary) hypertension: Secondary | ICD-10-CM

## 2023-07-03 ENCOUNTER — Ambulatory Visit: Payer: 59 | Admitting: Nurse Practitioner

## 2023-07-15 ENCOUNTER — Ambulatory Visit: Payer: 59 | Admitting: Nurse Practitioner

## 2023-07-15 ENCOUNTER — Encounter: Payer: Self-pay | Admitting: Nurse Practitioner

## 2023-07-15 VITALS — BP 138/80 | HR 89 | Temp 98.0°F | Ht 69.0 in | Wt 276.0 lb

## 2023-07-15 DIAGNOSIS — E66813 Obesity, class 3: Secondary | ICD-10-CM

## 2023-07-15 DIAGNOSIS — E559 Vitamin D deficiency, unspecified: Secondary | ICD-10-CM | POA: Diagnosis not present

## 2023-07-15 DIAGNOSIS — E88819 Insulin resistance, unspecified: Secondary | ICD-10-CM

## 2023-07-15 DIAGNOSIS — R0683 Snoring: Secondary | ICD-10-CM | POA: Diagnosis not present

## 2023-07-15 DIAGNOSIS — R400 Somnolence: Secondary | ICD-10-CM

## 2023-07-15 DIAGNOSIS — Z6841 Body Mass Index (BMI) 40.0 and over, adult: Secondary | ICD-10-CM

## 2023-07-15 MED ORDER — METFORMIN HCL 500 MG PO TABS: 500.0000 mg | ORAL_TABLET | Freq: Every day | ORAL | 0 refills | Status: DC

## 2023-07-15 MED ORDER — VITAMIN D (ERGOCALCIFEROL) 1.25 MG (50000 UNIT) PO CAPS: 50000.0000 [IU] | ORAL_CAPSULE | ORAL | 0 refills | Status: DC

## 2023-07-15 NOTE — Progress Notes (Signed)
 Office: [phone]  /  Fax: [phone]  WEIGHT SUMMARY AND BIOMETRICS  Weight Lost Since Last Visit: 0lb  Weight Gained Since Last Visit: 1lb   Vitals Temp: 98 F (36.7 C) BP: 138/80 Pulse Rate: 89 SpO2: 98 %   Anthropometric Measurements Height: 5\' 9"  (1.753 m) Weight: 276 lb (125.2 kg) BMI (Calculated): 40.74 Weight at Last Visit: 275lb Weight Lost Since Last Visit: 0lb Weight Gained Since Last Visit: 1lb Starting Weight: 245lb Total Weight Loss (lbs): 0 lb (0 kg)   Body Composition  Body Fat %: 47 % Fat Mass (lbs): 130 lbs Muscle Mass (lbs): 139.4 lbs Total Body Water (lbs): 109.2 lbs Visceral Fat Rating : 12   Other Clinical Data Fasting: No Labs: No Today's Visit #: 26 Starting Date: 03/27/20     HPI  Chief Complaint: OBESITY  Brittany Elliott is here to discuss her progress with her obesity treatment plan. She is on the the Category 3 Plan and states she is following her eating plan approximately 20 % of the time. She states she is exercising 0 minutes 0 days per week.   Interval History:  Since last office visit she has gained 1 pound.    Her highest weight was 303 lbs and nadir weight was 229 lbs.   Pharmacotherapy for weight loss: She is currently taking Zepbound 2.5mg  (started on 07/12/22).  Denies side effects.     Previous pharmacotherapy for medical weight loss:   She has tried [name], [place], Wegovy, Qsymia, Victoza and Topamax.  -She stopped Topamax due to daytime sleepiness.  -Feels Wegovy worked better for her by helping reduce her appetite and cravings more and she lost more weight while taking it.  -She was unable to continue [name] and [name] due to cost.  -Victoza    Bariatric surgery:  Patient has not had bariatric surgery.    Insulin Resistance Last fasting insulin was 5.6. A1c was 18.1. Polyphagia:Yes Medication(s): Zepbound 2.5mg .  Denies side effects.    Lab Results  Component Value Date   HGBA1C 5.6 01/14/2023    HGBA1C 5.3 05/08/2022   HGBA1C 5.4 05/07/2021   HGBA1C 5.4 01/11/2021   HGBA1C 5.5 03/27/2020   Lab Results  Component Value Date   INSULIN 18.1 01/14/2023   INSULIN 12.7 05/08/2022   INSULIN 18.8 05/07/2021   INSULIN 14.2 01/11/2021   INSULIN 13.2 03/27/2020   Daytime sleepiness/snoring Reports snoring and daytime sleeping.  Unsure of apneic pauses.  Vit D deficiency  She is taking Vit D 50,000 IU weekly.  Denies side effects.  Denies nausea, vomiting or muscle weakness.    Lab Results  Component Value Date   VD25OH 39.4 01/14/2023   VD25OH 28.8 (L) 05/08/2022   VD25OH 55.6 08/07/2021     PHYSICAL EXAM:  Blood pressure 138/80, pulse 89, temperature 98 F (36.7 C), height 5\' 9"  (1.753 m), weight 276 lb (125.2 kg), SpO2 98%. Body mass index is 40.76 kg/m.  General: She is overweight, cooperative, alert, well developed, and in no acute distress. PSYCH: Has normal mood, affect and thought process.   Extremities: No edema.  Neurologic: No gross sensory or motor deficits. No tremors or fasciculations noted.    DIAGNOSTIC DATA REVIEWED:  BMET    Component Value Date/Time   NA 138 01/14/2023 0756   K 4.1 01/14/2023 0756   CL 105 01/14/2023 0756   CO2 21 01/14/2023 0756   GLUCOSE 87 01/14/2023 0756   GLUCOSE 95 04/21/2018 2111   BUN 13 01/14/2023 0756  CREATININE 0.82 01/14/2023 0756   CALCIUM 9.3 01/14/2023 0756   GFRNONAA 90 03/10/2020 1124   GFRAA 104 03/10/2020 1124   Lab Results  Component Value Date   HGBA1C 5.6 01/14/2023   HGBA1C 5.5 03/27/2020   Lab Results  Component Value Date   INSULIN 18.1 01/14/2023   INSULIN 13.2 03/27/2020   Lab Results  Component Value Date   TSH 1.790 01/14/2023   CBC    Component Value Date/Time   WBC 6.2 05/08/2022 0823   WBC 8.4 11/13/2015 1601   RBC 4.03 05/08/2022 0823   RBC 4.12 11/13/2015 1601   HGB 12.3 05/08/2022 0823   HCT 36.2 05/08/2022 0823   PLT 336 05/08/2022 0823   MCV 90 05/08/2022 0823   MCH  30.5 05/08/2022 0823   MCH 29.1 11/13/2015 1601   MCHC 34.0 05/08/2022 0823   MCHC 33.0 11/13/2015 1601   RDW 11.8 05/08/2022 0823   Iron Studies    Component Value Date/Time   IRON 59 05/08/2022 0823   TIBC 291 08/07/2021 1557   FERRITIN 110 05/08/2022 0823   IRONPCTSAT 20 08/07/2021 1557   Lipid Panel     Component Value Date/Time   CHOL 145 01/14/2023 0756   TRIG 55 01/14/2023 0756   HDL 61 01/14/2023 0756   LDLCALC 72 01/14/2023 0756   Hepatic Function Panel     Component Value Date/Time   PROT 7.0 01/14/2023 0756   ALBUMIN 4.2 01/14/2023 0756   AST 13 01/14/2023 0756   ALT 29 01/14/2023 0756   ALKPHOS 71 01/14/2023 0756   BILITOT 0.8 01/14/2023 0756      Component Value Date/Time   TSH 1.790 01/14/2023 0756   Nutritional Lab Results  Component Value Date   VD25OH 39.4 01/14/2023   VD25OH 28.8 (L) 05/08/2022   VD25OH 55.6 08/07/2021     ASSESSMENT AND PLAN  TREATMENT PLAN FOR OBESITY:  Recommended Dietary Goals  Imanie is currently in the action stage of change. As such, her goal is to continue weight management plan. She has agreed to the Category 3 Plan.  Behavioral Intervention  We discussed the following Behavioral Modification Strategies today: increasing lean protein intake to established goals, decreasing simple carbohydrates , increasing vegetables, increasing water intake , work on meal planning and preparation, work on tracking and journaling calories using tracking application, reading food labels , keeping healthy foods at home, continue to work on implementation of reduced calorie nutritional plan, continue to practice mindfulness when eating, planning for success, and continue to work on maintaining a reduced calorie state, getting the recommended amount of protein, incorporating whole foods, making healthy choices, staying well hydrated and practicing mindfulness when eating..  Additional resources provided today: NA  Recommended  Physical Activity Goals  Renita has been advised to work up to 150 minutes of moderate intensity aerobic activity a week and strengthening exercises 2-3 times per week for cardiovascular health, weight loss maintenance and preservation of muscle mass.   She has agreed to Think about enjoyable ways to increase daily physical activity and overcoming barriers to exercise, Increase physical activity in their day and reduce sedentary time (increase NEAT)., and Work on scheduling and tracking physical activity.    Pharmacotherapy We discussed various medication options to help [facility] with her weight loss efforts and we both agreed to continue Zepbound 2.5mg .  Side effects discussed.  ASSOCIATED CONDITIONS ADDRESSED TODAY  Action/Plan  Daytime sleepiness -     Ambulatory referral to Pulmonology for eval for  OSAS  Snoring -     Ambulatory referral to Pulmonology for eval for OSAS  Vitamin D deficiency -     Vitamin D (Ergocalciferol); Take 1 capsule (50,000 Units total) by mouth once a week.  Dispense: 5 capsule; Refill: 0  Insulin resistance -     Restart metFORMIN HCl; Take 1 tablet (500 mg total) by mouth daily with breakfast.  Dispense: 30 tablet; Refill: 0.  Side effects discussed  Class 3 severe obesity due to excess calories with serious comorbidity and body mass index (BMI) of 40.0 to 44.9 in adult [facility])      Will recheck labs in 1-3 months.     Return in about 4 weeks (around 08/12/2023).Marland Kitchen She was informed of the importance of frequent follow up visits to maximize her success with intensive lifestyle modifications for her multiple health conditions.   ATTESTASTION STATEMENTS:  Reviewed by clinician on day of visit: allergies, medications, problem list, medical history, surgical history, family history, social history, and previous encounter notes.     Theodis Sato. Eduarda Scrivens FNP-C

## 2023-07-28 ENCOUNTER — Other Ambulatory Visit: Payer: Self-pay | Admitting: Nurse Practitioner

## 2023-07-31 ENCOUNTER — Ambulatory Visit: Payer: 59 | Admitting: Nurse Practitioner

## 2023-08-04 ENCOUNTER — Encounter: Payer: Self-pay | Admitting: Nurse Practitioner

## 2023-08-04 ENCOUNTER — Ambulatory Visit: Admitting: Nurse Practitioner

## 2023-08-04 VITALS — BP 129/82 | HR 89 | Temp 98.2°F | Ht 69.0 in | Wt 272.0 lb

## 2023-08-04 DIAGNOSIS — E66813 Obesity, class 3: Secondary | ICD-10-CM | POA: Diagnosis not present

## 2023-08-04 DIAGNOSIS — R400 Somnolence: Secondary | ICD-10-CM

## 2023-08-04 DIAGNOSIS — E88819 Insulin resistance, unspecified: Secondary | ICD-10-CM | POA: Diagnosis not present

## 2023-08-04 DIAGNOSIS — Z6841 Body Mass Index (BMI) 40.0 and over, adult: Secondary | ICD-10-CM

## 2023-08-04 DIAGNOSIS — I10 Essential (primary) hypertension: Secondary | ICD-10-CM

## 2023-08-04 MED ORDER — HYDROCHLOROTHIAZIDE 25 MG PO TABS: 25.0000 mg | ORAL_TABLET | Freq: Every day | ORAL | 0 refills | Status: DC

## 2023-08-04 MED ORDER — METFORMIN HCL 500 MG PO TABS: 500.0000 mg | ORAL_TABLET | Freq: Every day | ORAL | 0 refills | Status: DC

## 2023-08-04 MED ORDER — TIRZEPATIDE-WEIGHT MANAGEMENT 5 MG/0.5ML ~~LOC~~ SOLN: 5.0000 mg | SUBCUTANEOUS | 0 refills | Status: DC

## 2023-08-04 NOTE — Progress Notes (Signed)
 Office: [phone]  /  Fax: [phone]  WEIGHT SUMMARY AND BIOMETRICS  Weight Lost Since Last Visit: 4lb  Weight Gained Since Last Visit: 0lb   Vitals Temp: 98.2 F (36.8 C) BP: 129/82 Pulse Rate: 89 SpO2: 100 %   Anthropometric Measurements Height: 5\' 9"  (1.753 m) Weight: 272 lb (123.4 kg) BMI (Calculated): 40.15 Weight at Last Visit: 276lb Weight Lost Since Last Visit: 4lb Weight Gained Since Last Visit: 0lb Starting Weight: 245lb Total Weight Loss (lbs): 3 lb (1.361 kg)   Body Composition  Body Fat %: 44.4 % Fat Mass (lbs): 121.2 lbs Muscle Mass (lbs): 144 lbs Total Body Water (lbs): 101.8 lbs Visceral Fat Rating : 11   Other Clinical Data Fasting: No Labs: No Today's Visit #: 44 Starting Date: 03/27/20     HPI  Chief Complaint: OBESITY  Brittany Elliott is here to discuss her progress with her obesity treatment plan. She is on the the Category 3 Plan and states she is following her eating plan approximately 30 % of the time. She states she is exercising 60 minutes 4 days per week.   Interval History:  Since last office visit she has lost 4 pounds.  She started walking more this past weekend and has increased her water intake.    Her highest weight was 303 lbs and nadir weight was 229 lbs.    Pharmacotherapy for weight loss: She is currently taking Zepbound 2.5mg  (started on 07/12/22).  Denies side effects.     Previous pharmacotherapy for medical weight loss:   She has tried [name], [place], Wegovy, Qsymia, Victoza and Topamax.  -She stopped Topamax due to daytime sleepiness.  -Feels Wegovy worked better for her by helping reduce her appetite and cravings more and she lost more weight while taking it.  -She was unable to continue [name] and [name] due to cost.  -Victoza    Bariatric surgery:  Patient has not had bariatric surgery.   Insulin Resistance Last fasting insulin was 18.1. A1c was 5.6. Polyphagia:Yes Medication(s): Metformin 500mg   (started after last visit) and Zepbound 2.5mg .  Notes side effects of "softer stools" since starting Metformin.    Lab Results  Component Value Date   HGBA1C 5.6 01/14/2023   HGBA1C 5.3 05/08/2022   HGBA1C 5.4 05/07/2021   HGBA1C 5.4 01/11/2021   HGBA1C 5.5 03/27/2020   Lab Results  Component Value Date   INSULIN 18.1 01/14/2023   INSULIN 12.7 05/08/2022   INSULIN 18.8 05/07/2021   INSULIN 14.2 01/11/2021   INSULIN 13.2 03/27/2020     Daytime sleepiness Has PSG last night.  Awaiting results.    Hypertension Hypertension stable.  Medication(s): hydrochlorothiazide 25mg . Denies side effects.   Denies chest pain, palpitations and SOB.  BP Readings from Last 3 Encounters:  08/04/23 129/82  07/15/23 138/80  06/12/23 138/88   Lab Results  Component Value Date   CREATININE 0.82 01/14/2023   CREATININE 0.90 05/08/2022   CREATININE 0.77 05/07/2021     PHYSICAL EXAM:  Blood pressure 129/82, pulse 89, temperature 98.2 F (36.8 C), height 5\' 9"  (1.753 m), weight 272 lb (123.4 kg), SpO2 100%. Body mass index is 40.17 kg/m.  General: She is overweight, cooperative, alert, well developed, and in no acute distress. PSYCH: Has normal mood, affect and thought process.   Extremities: No edema.  Neurologic: No gross sensory or motor deficits. No tremors or fasciculations noted.    DIAGNOSTIC DATA REVIEWED:  BMET    Component Value Date/Time   NA 138 01/14/2023  0756   K 4.1 01/14/2023 0756   CL 105 01/14/2023 0756   CO2 21 01/14/2023 0756   GLUCOSE 87 01/14/2023 0756   GLUCOSE 95 04/21/2018 2111   BUN 13 01/14/2023 0756   CREATININE 0.82 01/14/2023 0756   CALCIUM 9.3 01/14/2023 0756   GFRNONAA 90 03/10/2020 1124   GFRAA 104 03/10/2020 1124   Lab Results  Component Value Date   HGBA1C 5.6 01/14/2023   HGBA1C 5.5 03/27/2020   Lab Results  Component Value Date   INSULIN 18.1 01/14/2023   INSULIN 13.2 03/27/2020   Lab Results  Component Value Date   TSH 1.790  01/14/2023   CBC    Component Value Date/Time   WBC 6.2 05/08/2022 0823   WBC 8.4 11/13/2015 1601   RBC 4.03 05/08/2022 0823   RBC 4.12 11/13/2015 1601   HGB 12.3 05/08/2022 0823   HCT 36.2 05/08/2022 0823   PLT 336 05/08/2022 0823   MCV 90 05/08/2022 0823   MCH 30.5 05/08/2022 0823   MCH 29.1 11/13/2015 1601   MCHC 34.0 05/08/2022 0823   MCHC 33.0 11/13/2015 1601   RDW 11.8 05/08/2022 0823   Iron Studies    Component Value Date/Time   IRON 59 05/08/2022 0823   TIBC 291 08/07/2021 1557   FERRITIN 110 05/08/2022 0823   IRONPCTSAT 20 08/07/2021 1557   Lipid Panel     Component Value Date/Time   CHOL 145 01/14/2023 0756   TRIG 55 01/14/2023 0756   HDL 61 01/14/2023 0756   LDLCALC 72 01/14/2023 0756   Hepatic Function Panel     Component Value Date/Time   PROT 7.0 01/14/2023 0756   ALBUMIN 4.2 01/14/2023 0756   AST 13 01/14/2023 0756   ALT 29 01/14/2023 0756   ALKPHOS 71 01/14/2023 0756   BILITOT 0.8 01/14/2023 0756      Component Value Date/Time   TSH 1.790 01/14/2023 0756   Nutritional Lab Results  Component Value Date   VD25OH 39.4 01/14/2023   VD25OH 28.8 (L) 05/08/2022   VD25OH 55.6 08/07/2021     ASSESSMENT AND PLAN  TREATMENT PLAN FOR OBESITY:  Recommended Dietary Goals  Helina is currently in the action stage of change. As such, her goal is to continue weight management plan. She has agreed to the Category 3 Plan.  Behavioral Intervention  We discussed the following Behavioral Modification Strategies today: increasing lean protein intake to established goals, decreasing simple carbohydrates , increasing vegetables, increasing fiber rich foods, increasing water intake , work on meal planning and preparation, reading food labels , keeping healthy foods at home, continue to work on implementation of reduced calorie nutritional plan, continue to practice mindfulness when eating, planning for success, and continue to work on maintaining a reduced  calorie state, getting the recommended amount of protein, incorporating whole foods, making healthy choices, staying well hydrated and practicing mindfulness when eating..  Additional resources provided today: NA  Recommended Physical Activity Goals  Sharlena has been advised to work up to 150 minutes of moderate intensity aerobic activity a week and strengthening exercises 2-3 times per week for cardiovascular health, weight loss maintenance and preservation of muscle mass.   She has agreed to Continue current level of physical activity    Pharmacotherapy We discussed various medication options to help Charnese with her weight loss efforts and we both agreed to increase Zepbound 5mg .  side effects discussed.  ASSOCIATED CONDITIONS ADDRESSED TODAY  Action/Plan  Insulin resistance -     Continue  metFORMIN HCl; Take 1 tablet (500 mg total) by mouth daily with breakfast.  Dispense: 30 tablet; Refill: 0.  Side effects discussed  Essential hypertension -     hydroCHLOROthiazide; Take 1 tablet (25 mg total) by mouth daily.  Dispense: 30 tablet; Refill: 0.  Side effects discussed  Daytime sleepiness Awaiting PSG results.  Class 3 severe obesity due to excess calories with serious comorbidity and body mass index (BMI) of 40.0 to 44.9 in adult [facility]) -     Tirzepatide-Weight Management; Inject 5 mg into the skin once a week.  Dispense: 2 mL; Refill: 0      Will obtain labs in next 1-2 months.    Return in about 4 weeks (around 09/01/2023).Marland Kitchen She was informed of the importance of frequent follow up visits to maximize her success with intensive lifestyle modifications for her multiple health conditions.   ATTESTASTION STATEMENTS:  Reviewed by clinician on day of visit: allergies, medications, problem list, medical history, surgical history, family history, social history, and previous encounter notes.     Theodis Sato. Keilah Lemire FNP-C

## 2023-08-06 ENCOUNTER — Other Ambulatory Visit: Payer: Self-pay | Admitting: Nurse Practitioner

## 2023-08-06 ENCOUNTER — Encounter: Payer: Self-pay | Admitting: Nurse Practitioner

## 2023-08-07 ENCOUNTER — Other Ambulatory Visit: Payer: Self-pay | Admitting: Nurse Practitioner

## 2023-08-07 DIAGNOSIS — Z6841 Body Mass Index (BMI) 40.0 and over, adult: Secondary | ICD-10-CM

## 2023-08-07 DIAGNOSIS — E66813 Obesity, class 3: Secondary | ICD-10-CM

## 2023-08-07 DIAGNOSIS — G4733 Obstructive sleep apnea (adult) (pediatric): Secondary | ICD-10-CM

## 2023-08-07 MED ORDER — TIRZEPATIDE-WEIGHT MANAGEMENT 5 MG/0.5ML ~~LOC~~ SOLN: 5.0000 mg | SUBCUTANEOUS | 0 refills | Status: DC

## 2023-08-19 ENCOUNTER — Ambulatory Visit: Admitting: Nurse Practitioner

## 2023-08-19 ENCOUNTER — Encounter: Payer: Self-pay | Admitting: Nurse Practitioner

## 2023-08-19 VITALS — BP 132/92 | HR 95 | Temp 97.8°F | Ht 69.0 in | Wt 272.0 lb

## 2023-08-19 DIAGNOSIS — R1013 Epigastric pain: Secondary | ICD-10-CM | POA: Diagnosis not present

## 2023-08-19 DIAGNOSIS — Z6841 Body Mass Index (BMI) 40.0 and over, adult: Secondary | ICD-10-CM

## 2023-08-19 DIAGNOSIS — E559 Vitamin D deficiency, unspecified: Secondary | ICD-10-CM

## 2023-08-19 DIAGNOSIS — G4733 Obstructive sleep apnea (adult) (pediatric): Secondary | ICD-10-CM

## 2023-08-19 DIAGNOSIS — E66813 Obesity, class 3: Secondary | ICD-10-CM

## 2023-08-19 DIAGNOSIS — Z79899 Other long term (current) drug therapy: Secondary | ICD-10-CM

## 2023-08-19 MED ORDER — TIRZEPATIDE-WEIGHT MANAGEMENT 7.5 MG/0.5ML ~~LOC~~ SOLN
7.5000 mg | SUBCUTANEOUS | 0 refills | Status: DC
Start: 2023-08-19 — End: 2023-10-09

## 2023-08-19 MED ORDER — VITAMIN D (ERGOCALCIFEROL) 1.25 MG (50000 UNIT) PO CAPS: 50000.0000 [IU] | ORAL_CAPSULE | ORAL | 0 refills | Status: DC

## 2023-08-19 NOTE — Progress Notes (Signed)
 Office: [phone]  /  Fax: [phone]  WEIGHT SUMMARY AND BIOMETRICS  Weight Lost Since Last Visit: 0  Weight Gained Since Last Visit: 0   Vitals Temp: 97.8 F (36.6 C) BP: (!) 132/92 Pulse Rate: 95 SpO2: 99 %   Anthropometric Measurements Height: 5\' 9"  (1.753 m) Weight: 272 lb (123.4 kg) BMI (Calculated): 40.15 Weight at Last Visit: 272lb Weight Lost Since Last Visit: 0 Weight Gained Since Last Visit: 0 Starting Weight: 245lb Total Weight Loss (lbs): 3 lb (1.361 kg)   Body Composition  Body Fat %: 44.7 % Fat Mass (lbs): 122 lbs Muscle Mass (lbs): 143.2 lbs Total Body Water (lbs): 105.6 lbs Visceral Fat Rating : 12   Other Clinical Data Fasting: no Labs: no Today's Visit #: 45 Starting Date: 03/27/20     HPI  Chief Complaint: OBESITY  Ally is here to discuss her progress with her obesity treatment plan. She is on the the Category 3 Plan and states she is following her eating plan approximately 40 % of the time. She states she is exercising 0 minutes 0 days per week.   Interval History:  Since last office visit she has maintained her weight.  She is eating 1-2 meals per day due to her work schedule.  Sometimes she takes her lunch but doesn't always get to eat it.  Finds she is not hungry in the am.  She feels she has gotten off track and is struggling to get back on track.  She used to get up early and go to the gym but finds now it's hard to get up and go.  She was recently eval for [facility].  She is scheduled the second week of April to get fitted for her CPAP mask.      Pharmacotherapy for weight loss: She is currently taking Zepbound 5mg  x 2 doses for medical weight loss. She started Zepbound on 07/12/22.   Yesterday she notes abd cramping and once she burped she felt better.   No longer c/o abd pain.    Previous pharmacotherapy for medical weight loss:   She has tried [name], [place], Wegovy, Qsymia, Victoza and Topamax.  -She stopped Topamax  due to daytime sleepiness.  -Feels Wegovy worked better for her by helping reduce her appetite and cravings more and she lost more weight while taking it.  -She was unable to continue [name] and [name] due to cost.  -Victoza    Bariatric surgery:  Patient has not had bariatric surgery.     Vit D deficiency  She is taking Vit D 50,000 IU weekly.  Denies side effects.  Denies nausea, vomiting or muscle weakness.    Lab Results  Component Value Date   VD25OH 39.4 01/14/2023   VD25OH 28.8 (L) 05/08/2022   VD25OH 55.6 08/07/2021      PHYSICAL EXAM:  Blood pressure (!) 132/92, pulse 95, temperature 97.8 F (36.6 C), height 5\' 9"  (1.753 m), weight 272 lb (123.4 kg), SpO2 99%. Body mass index is 40.17 kg/m.  General: She is overweight, cooperative, alert, well developed, and in no acute distress. PSYCH: Has normal mood, affect and thought process.   Extremities: No edema.  Neurologic: No gross sensory or motor deficits. No tremors or fasciculations noted.    DIAGNOSTIC DATA REVIEWED:  BMET    Component Value Date/Time   NA 138 01/14/2023 0756   K 4.1 01/14/2023 0756   CL 105 01/14/2023 0756   CO2 21 01/14/2023 0756   GLUCOSE 87 01/14/2023  0756   GLUCOSE 95 04/21/2018 2111   BUN 13 01/14/2023 0756   CREATININE 0.82 01/14/2023 0756   CALCIUM 9.3 01/14/2023 0756   GFRNONAA 90 03/10/2020 1124   GFRAA 104 03/10/2020 1124   Lab Results  Component Value Date   HGBA1C 5.6 01/14/2023   HGBA1C 5.5 03/27/2020   Lab Results  Component Value Date   INSULIN 18.1 01/14/2023   INSULIN 13.2 03/27/2020   Lab Results  Component Value Date   TSH 1.790 01/14/2023   CBC    Component Value Date/Time   WBC 6.2 05/08/2022 0823   WBC 8.4 11/13/2015 1601   RBC 4.03 05/08/2022 0823   RBC 4.12 11/13/2015 1601   HGB 12.3 05/08/2022 0823   HCT 36.2 05/08/2022 0823   PLT 336 05/08/2022 0823   MCV 90 05/08/2022 0823   MCH 30.5 05/08/2022 0823   MCH 29.1 11/13/2015 1601   MCHC 34.0  05/08/2022 0823   MCHC 33.0 11/13/2015 1601   RDW 11.8 05/08/2022 0823   Iron Studies    Component Value Date/Time   IRON 59 05/08/2022 0823   TIBC 291 08/07/2021 1557   FERRITIN 110 05/08/2022 0823   IRONPCTSAT 20 08/07/2021 1557   Lipid Panel     Component Value Date/Time   CHOL 145 01/14/2023 0756   TRIG 55 01/14/2023 0756   HDL 61 01/14/2023 0756   LDLCALC 72 01/14/2023 0756   Hepatic Function Panel     Component Value Date/Time   PROT 7.0 01/14/2023 0756   ALBUMIN 4.2 01/14/2023 0756   AST 13 01/14/2023 0756   ALT 29 01/14/2023 0756   ALKPHOS 71 01/14/2023 0756   BILITOT 0.8 01/14/2023 0756      Component Value Date/Time   TSH 1.790 01/14/2023 0756   Nutritional Lab Results  Component Value Date   VD25OH 39.4 01/14/2023   VD25OH 28.8 (L) 05/08/2022   VD25OH 55.6 08/07/2021     ASSESSMENT AND PLAN  TREATMENT PLAN FOR OBESITY:  Recommended Dietary Goals  Kayleeann is currently in the action stage of change. As such, her goal is to continue weight management plan. She has agreed to the Category 3 Plan. I've encouraged her to track and will review at next visit.   Behavioral Intervention  We discussed the following Behavioral Modification Strategies today: increasing lean protein intake to established goals, decreasing simple carbohydrates , increasing vegetables, increasing fiber rich foods, increasing water intake , work on meal planning and preparation, reading food labels , keeping healthy foods at home, planning for success, and continue to work on maintaining a reduced calorie state, getting the recommended amount of protein, incorporating whole foods, making healthy choices, staying well hydrated and practicing mindfulness when eating..  Additional resources provided today: NA  Recommended Physical Activity Goals  Skiler has been advised to work up to 150 minutes of moderate intensity aerobic activity a week and strengthening exercises 2-3 times  per week for cardiovascular health, weight loss maintenance and preservation of muscle mass.   She has agreed to Think about enjoyable ways to increase daily physical activity and overcoming barriers to exercise, Increase physical activity in their day and reduce sedentary time (increase NEAT)., and Work on scheduling and tracking physical activity.    Pharmacotherapy We discussed various medication options to help [facility] with her weight loss efforts and we both agreed to continue Zepbound 5mg  x 2 weeks and then increase to 7.5mg .  Side effects discussed.    ASSOCIATED CONDITIONS ADDRESSED TODAY  Action/Plan  Vitamin D deficiency -     Vitamin D (Ergocalciferol); Take 1 capsule (50,000 Units total) by mouth once a week.  Dispense: 5 capsule; Refill: 0 -     VITAMIN D 25 Hydroxy (Vit-D Deficiency, Fractures)  Low Vitamin D level contributes to fatigue and are associated with obesity, breast, and colon cancer. She agrees to continue to take prescription Vitamin D @50 ,000 IU every week and will follow-up for routine testing of Vitamin D, at least 2-3 times per year to avoid over-replacement.   Obstructive sleep apnea syndrome Keep appt for CPAP fitting and supplies  Medication management -     Comprehensive metabolic panel -     Vitamin B12  Epigastric pain -     Comprehensive metabolic panel -     CBC with Differential/Platelet -     Amylase -     Lipase  Will go ahead and obtain labs.  Will continue to monitor.  Let me know if symptoms return.    Class 3 severe obesity due to excess calories with serious comorbidity and body mass index (BMI) of 40.0 to 44.9 in adult [facility]) -     Tirzepatide-Weight Management; Inject 7.5 mg into the skin once a week.  Dispense: 2 mL; Refill: 0        Return in about 4 weeks (around 09/16/2023).Marland Kitchen She was informed of the importance of frequent follow up visits to maximize her success with intensive lifestyle modifications for her multiple health  conditions.   ATTESTASTION STATEMENTS:  Reviewed by clinician on day of visit: allergies, medications, problem list, medical history, surgical history, family history, social history, and previous encounter notes.     Theodis Sato. [employer] FNP-C

## 2023-08-20 LAB — COMPREHENSIVE METABOLIC PANEL
ALT: 25 IU/L (ref 0–32)
AST: 12 IU/L (ref 0–40)
Albumin: 4.3 g/dL (ref 3.9–4.9)
Alkaline Phosphatase: 67 IU/L (ref 44–121)
BUN/Creatinine Ratio: 18 (ref 9–23)
BUN: 14 mg/dL (ref 6–20)
Bilirubin Total: 0.4 mg/dL (ref 0.0–1.2)
CO2: 22 mmol/L (ref 20–29)
Calcium: 9.8 mg/dL (ref 8.7–10.2)
Chloride: 105 mmol/L (ref 96–106)
Creatinine, Ser: 0.76 mg/dL (ref 0.57–1.00)
Globulin, Total: 3.1 g/dL (ref 1.5–4.5)
Glucose: 90 mg/dL (ref 70–99)
Potassium: 4.2 mmol/L (ref 3.5–5.2)
Sodium: 139 mmol/L (ref 134–144)
Total Protein: 7.4 g/dL (ref 6.0–8.5)
eGFR: 104 mL/min/{1.73_m2} (ref >59)

## 2023-08-20 LAB — CBC WITH DIFFERENTIAL/PLATELET
Basophils Absolute: 0 10*3/uL (ref 0.0–0.2)
Basos: 0 %
EOS (ABSOLUTE): 0 10*3/uL (ref 0.0–0.4)
Eos: 1 %
Hematocrit: 38.4 % (ref 34.0–46.6)
Hemoglobin: 13.2 g/dL (ref 11.1–15.9)
Immature Grans (Abs): 0.1 10*3/uL (ref 0.0–0.1)
Immature Granulocytes: 1 %
Lymphocytes Absolute: 2.9 10*3/uL (ref 0.7–3.1)
Lymphs: 36 %
MCH: 31 pg (ref 26.6–33.0)
MCHC: 34.4 g/dL (ref 31.5–35.7)
MCV: 90 fL (ref 79–97)
Monocytes Absolute: 0.3 10*3/uL (ref 0.1–0.9)
Monocytes: 4 %
Neutrophils Absolute: 4.6 10*3/uL (ref 1.4–7.0)
Neutrophils: 58 %
Platelets: 328 10*3/uL (ref 150–450)
RBC: 4.26 x10E6/uL (ref 3.77–5.28)
RDW: 12.1 % (ref 11.7–15.4)
WBC: 7.9 10*3/uL (ref 3.4–10.8)

## 2023-08-20 LAB — LIPASE: Lipase: 46 U/L (ref 14–72)

## 2023-08-20 LAB — VITAMIN D 25 HYDROXY (VIT D DEFICIENCY, FRACTURES): Vit D, 25-Hydroxy: 43.6 ng/mL (ref 30.0–100.0)

## 2023-08-20 LAB — VITAMIN B12: Vitamin B-12: 588 pg/mL (ref 232–1245)

## 2023-08-20 LAB — AMYLASE: Amylase: 106 U/L (ref 31–110)

## 2023-08-26 ENCOUNTER — Other Ambulatory Visit: Payer: Self-pay | Admitting: Nurse Practitioner

## 2023-08-26 DIAGNOSIS — E66813 Obesity, class 3: Secondary | ICD-10-CM

## 2023-09-15 ENCOUNTER — Ambulatory Visit: Admitting: Nurse Practitioner

## 2023-09-15 ENCOUNTER — Encounter: Payer: Self-pay | Admitting: Nurse Practitioner

## 2023-09-15 VITALS — BP 111/75 | HR 85 | Temp 98.5°F | Ht 69.0 in | Wt 267.0 lb

## 2023-09-15 DIAGNOSIS — E559 Vitamin D deficiency, unspecified: Secondary | ICD-10-CM

## 2023-09-15 DIAGNOSIS — E66812 Obesity, class 2: Secondary | ICD-10-CM | POA: Diagnosis not present

## 2023-09-15 DIAGNOSIS — G4733 Obstructive sleep apnea (adult) (pediatric): Secondary | ICD-10-CM | POA: Diagnosis not present

## 2023-09-15 DIAGNOSIS — I10 Essential (primary) hypertension: Secondary | ICD-10-CM

## 2023-09-15 DIAGNOSIS — Z6839 Body mass index (BMI) 39.0-39.9, adult: Secondary | ICD-10-CM

## 2023-09-15 DIAGNOSIS — E88819 Insulin resistance, unspecified: Secondary | ICD-10-CM

## 2023-09-15 MED ORDER — HYDROCHLOROTHIAZIDE 25 MG PO TABS: 25.0000 mg | ORAL_TABLET | Freq: Every day | ORAL | 0 refills | Status: DC

## 2023-09-15 MED ORDER — METFORMIN HCL 500 MG PO TABS: 500.0000 mg | ORAL_TABLET | Freq: Every day | ORAL | 0 refills | Status: DC

## 2023-09-15 MED ORDER — VITAMIN D (ERGOCALCIFEROL) 1.25 MG (50000 UNIT) PO CAPS: 50000.0000 [IU] | ORAL_CAPSULE | ORAL | 0 refills | Status: DC

## 2023-09-15 MED ORDER — TIRZEPATIDE-WEIGHT MANAGEMENT 10 MG/0.5ML ~~LOC~~ SOLN: 10.0000 mg | SUBCUTANEOUS | 0 refills | Status: DC

## 2023-09-15 NOTE — Progress Notes (Signed)
 Office: [phone]  /  Fax: [phone]  WEIGHT SUMMARY AND BIOMETRICS  Weight Lost Since Last Visit: 5lb  Weight Gained Since Last Visit: 0lb   Vitals Temp: 98.5 F (36.9 C) BP: 111/75 Pulse Rate: 85 SpO2: 100 %   Anthropometric Measurements Height: 5\' 9"  (1.753 m) Weight: 267 lb (121.1 kg) BMI (Calculated): 39.41 Weight at Last Visit: 272lb Weight Lost Since Last Visit: 5lb Weight Gained Since Last Visit: 0lb Starting Weight: 245lb Total Weight Loss (lbs): 0 lb (0 kg)   Body Composition  Body Fat %: 43.8 % Fat Mass (lbs): 117.2 lbs Muscle Mass (lbs): 142.8 lbs Total Body Water (lbs): 100 lbs Visceral Fat Rating : 11   Other Clinical Data Fasting: Yes Labs: No Today's Visit #: 74 Starting Date: 03/27/20     HPI  Chief Complaint: OBESITY  Brittany Elliott is here to discuss her progress with her obesity treatment plan. She is on the the Category 3 Plan and states she is following her eating plan approximately 30 % of the time. She states she is exercising 0 minutes 0 days per week.   Interval History:  Since last office visit she lost 5 pounds.  Since increasing to Zepbound 7.5mg , she notes it has helped with portion sizes.  Occ notes polyphagia and cravings.  She is drinking water but not enough.  Occ drinks a ginger ale.    Pharmacotherapy for weight loss: She is currently taking Zepbound 7.5mg  for medical weight loss (x 2 doses). She started Zepbound on 07/12/22.   Denies side effects.     Previous pharmacotherapy for medical weight loss:   She has tried [name] , Contrave , Wegovy , Qsymia , Victoza  and Topamax .  -She stopped Topamax  due to daytime sleepiness.  -Feels Wegovy  worked better for her by helping reduce her appetite and cravings more and she lost more weight while taking it.  -She was unable to continue Wegovy  and [name]  due to cost.  -Victoza     Bariatric surgery:  Patient has not had bariatric surgery.     Obstructive Sleep Apnea Brittany Elliott  has a diagnosis of sleep apnea. She reports that she is not using a CPAP regularly.    Vit D deficiency  She is taking Vit D 50,000 IU weekly.  Denies side effects.  Denies nausea, vomiting or muscle weakness.    Lab Results  Component Value Date   VD25OH 43.6 08/19/2023   VD25OH 39.4 01/14/2023   VD25OH 28.8 (L) 05/08/2022    Hypertension Hypertension well controlled.  Medication(s): HCTZ 25mg . Denies side effects.  Denies chest pain, palpitations and SOB.  BP Readings from Last 3 Encounters:  09/15/23 111/75  08/19/23 (!) 132/92  08/04/23 129/82   Lab Results  Component Value Date   CREATININE 0.76 08/19/2023   CREATININE 0.82 01/14/2023   CREATININE 0.90 05/08/2022     Insulin  Resistance Last fasting insulin  was 18.1. A1c was 5.6. Polyphagia:Yes Medication(s): Zepbound 7.5 mg SQ weekly Lab Results  Component Value Date   HGBA1C 5.6 01/14/2023   HGBA1C 5.3 05/08/2022   HGBA1C 5.4 05/07/2021   HGBA1C 5.4 01/11/2021   HGBA1C 5.5 03/27/2020   Lab Results  Component Value Date   INSULIN  18.1 01/14/2023   INSULIN  12.7 05/08/2022   INSULIN  18.8 05/07/2021   INSULIN  14.2 01/11/2021   INSULIN  13.2 03/27/2020     PHYSICAL EXAM:  Blood pressure 111/75, pulse 85, temperature 98.5 F (36.9 C), height 5\' 9"  (1.753 m), weight 267 lb (121.1 kg), SpO2 100%. Body mass index  is 39.43 kg/m.  General: She is overweight, cooperative, alert, well developed, and in no acute distress. PSYCH: Has normal mood, affect and thought process.   Extremities: No edema.  Neurologic: No gross sensory or motor deficits. No tremors or fasciculations noted.    DIAGNOSTIC DATA REVIEWED:  BMET    Component Value Date/Time   NA 139 08/19/2023 1557   K 4.2 08/19/2023 1557   CL 105 08/19/2023 1557   CO2 22 08/19/2023 1557   GLUCOSE 90 08/19/2023 1557   GLUCOSE 95 04/21/2018 2111   BUN 14 08/19/2023 1557   CREATININE 0.76 08/19/2023 1557   CALCIUM 9.8 08/19/2023 1557   GFRNONAA 90  03/10/2020 1124   GFRAA 104 03/10/2020 1124   Lab Results  Component Value Date   HGBA1C 5.6 01/14/2023   HGBA1C 5.5 03/27/2020   Lab Results  Component Value Date   INSULIN  18.1 01/14/2023   INSULIN  13.2 03/27/2020   Lab Results  Component Value Date   TSH 1.790 01/14/2023   CBC    Component Value Date/Time   WBC 7.9 08/19/2023 1557   WBC 8.4 11/13/2015 1601   RBC 4.26 08/19/2023 1557   RBC 4.12 11/13/2015 1601   HGB 13.2 08/19/2023 1557   HCT 38.4 08/19/2023 1557   PLT 328 08/19/2023 1557   MCV 90 08/19/2023 1557   MCH 31.0 08/19/2023 1557   MCH 29.1 11/13/2015 1601   MCHC 34.4 08/19/2023 1557   MCHC 33.0 11/13/2015 1601   RDW 12.1 08/19/2023 1557   Iron Studies    Component Value Date/Time   IRON 59 05/08/2022 0823   TIBC 291 08/07/2021 1557   FERRITIN 110 05/08/2022 0823   IRONPCTSAT 20 08/07/2021 1557   Lipid Panel     Component Value Date/Time   CHOL 145 01/14/2023 0756   TRIG 55 01/14/2023 0756   HDL 61 01/14/2023 0756   LDLCALC 72 01/14/2023 0756   Hepatic Function Panel     Component Value Date/Time   PROT 7.4 08/19/2023 1557   ALBUMIN 4.3 08/19/2023 1557   AST 12 08/19/2023 1557   ALT 25 08/19/2023 1557   ALKPHOS 67 08/19/2023 1557   BILITOT 0.4 08/19/2023 1557      Component Value Date/Time   TSH 1.790 01/14/2023 0756   Nutritional Lab Results  Component Value Date   VD25OH 43.6 08/19/2023   VD25OH 39.4 01/14/2023   VD25OH 28.8 (L) 05/08/2022     ASSESSMENT AND PLAN  TREATMENT PLAN FOR OBESITY:  Recommended Dietary Goals  Brittany Elliott is currently in the action stage of change. As such, her goal is to continue weight management plan. She has agreed to the Category 3 Plan.  Behavioral Intervention  We discussed the following Behavioral Modification Strategies today: increasing lean protein intake to established goals, decreasing simple carbohydrates , increasing vegetables, increasing water intake , and continue to work on  maintaining a reduced calorie state, getting the recommended amount of protein, incorporating whole foods, making healthy choices, staying well hydrated and practicing mindfulness when eating..  Additional resources provided today: NA  Recommended Physical Activity Goals  Brittany Elliott has been advised to work up to 150 minutes of moderate intensity aerobic activity a week and strengthening exercises 2-3 times per week for cardiovascular health, weight loss maintenance and preservation of muscle mass.   She has agreed to Think about enjoyable ways to increase daily physical activity and overcoming barriers to exercise, Increase physical activity in their day and reduce sedentary time (increase NEAT)., and  Work on scheduling and tracking physical activity.    Pharmacotherapy We discussed various medication options to help Brittany Elliott with her weight loss efforts and we both agreed to continue Zepbound 7.5mg  x 2 more weeks and then increase to 10mg .  ASSOCIATED CONDITIONS ADDRESSED TODAY  Action/Plan  Obstructive sleep apnea syndrome Continue to follow up with pulmonary.  Hasn't started CPAP due to cost.   Vitamin D  deficiency -     Vitamin D  (Ergocalciferol ); Take 1 capsule (50,000 Units total) by mouth once a week.  Dispense: 5 capsule; Refill: 0  Essential hypertension -     hydroCHLOROthiazide ; Take 1 tablet (25 mg total) by mouth daily.  Dispense: 30 tablet; Refill: 0  Class 2 severe obesity due to excess calories with serious comorbidity and body mass index (BMI) of 39.0 to 39.9 in adult [facility]) -     Tirzepatide-Weight Management; Inject 10 mg into the skin once a week.  Dispense: 2 mL; Refill: 0     Labs reviewed with patient from 08/19/23    Return in about 3 weeks (around 10/06/2023).Aaron Aas She was informed of the importance of frequent follow up visits to maximize her success with intensive lifestyle modifications for her multiple health conditions.   ATTESTASTION  STATEMENTS:  Reviewed by clinician on day of visit: allergies, medications, problem list, medical history, surgical history, family history, social history, and previous encounter notes.     Crist Dominion. Ina Scrivens FNP-C

## 2023-09-18 ENCOUNTER — Other Ambulatory Visit: Payer: Self-pay | Admitting: Nurse Practitioner

## 2023-09-18 DIAGNOSIS — Z6841 Body Mass Index (BMI) 40.0 and over, adult: Secondary | ICD-10-CM

## 2023-10-09 ENCOUNTER — Encounter: Payer: Self-pay | Admitting: Nurse Practitioner

## 2023-10-09 ENCOUNTER — Ambulatory Visit: Admitting: Nurse Practitioner

## 2023-10-09 VITALS — BP 130/89 | HR 91 | Temp 98.4°F | Ht 69.0 in | Wt 264.0 lb

## 2023-10-09 DIAGNOSIS — J302 Other seasonal allergic rhinitis: Secondary | ICD-10-CM

## 2023-10-09 DIAGNOSIS — Z6838 Body mass index (BMI) 38.0-38.9, adult: Secondary | ICD-10-CM

## 2023-10-09 DIAGNOSIS — E66812 Obesity, class 2: Secondary | ICD-10-CM

## 2023-10-09 MED ORDER — MONTELUKAST SODIUM 10 MG PO TABS: 10.0000 mg | ORAL_TABLET | Freq: Every day | ORAL | 3 refills | Status: DC

## 2023-10-09 MED ORDER — TIRZEPATIDE-WEIGHT MANAGEMENT 10 MG/0.5ML ~~LOC~~ SOLN: 10.0000 mg | SUBCUTANEOUS | 0 refills | Status: DC

## 2023-10-09 NOTE — Progress Notes (Signed)
 Office: [phone]  /  Fax: [phone]  WEIGHT SUMMARY AND BIOMETRICS  Weight Lost Since Last Visit: 3lb  Weight Gained Since Last Visit: 0lb   Vitals Temp: 98.4 F (36.9 C) BP: 130/89 Pulse Rate: 91 SpO2: 97 %   Anthropometric Measurements Height: 5\' 9"  (1.753 m) Weight: 264 lb (119.7 kg) BMI (Calculated): 38.97 Weight at Last Visit: 267lb Weight Lost Since Last Visit: 3lb Weight Gained Since Last Visit: 0lb Starting Weight: 245lb Total Weight Loss (lbs): 0 lb (0 kg)   Body Composition  Body Fat %: 44.7 % Fat Mass (lbs): 118.2 lbs Muscle Mass (lbs): 138.8 lbs Total Body Water (lbs): 102.6 lbs Visceral Fat Rating : 11   Other Clinical Data Fasting: Yes Labs: No Today's Visit #: 26 Starting Date: 03/27/20     HPI  Chief Complaint: OBESITY  Brittany Elliott is here to discuss her progress with her obesity treatment plan. She is on the the Category 3 Plan and states she is following her eating plan approximately 30 % of the time. She states she is exercising 0 minutes 0 days per week.   Interval History:  Since last office visit she has 3 pounds. She is drinking water and occ Ginger ale.  She is not exercising but plans to this summer.  Feels that she will have more time to focus on herself once school is out for the summer.     Pharmacotherapy for weight loss: She is currently taking Zepbound 10mg  (x 1 dose) for medical weight loss.  Denies side effects.    Previous pharmacotherapy for medical weight loss:   She has tried [name] , Contrave , Wegovy , Qsymia , Victoza  and Topamax .  -She stopped Topamax  due to daytime sleepiness.  -Feels Wegovy  worked better for her by helping reduce her appetite and cravings more and she lost more weight while taking it.  -She was unable to continue Wegovy  and [name]  due to cost.  -Victoza     Bariatric surgery:  Patient has not had bariatric surgery.     Allergies Went to Urgent care on 09/30/23.  She is taking claritin   and Flonase.  Still struggling with her allergies.   PHYSICAL EXAM:  Blood pressure 130/89, pulse 91, temperature 98.4 F (36.9 C), height 5\' 9"  (1.753 m), weight 264 lb (119.7 kg), SpO2 97%. Body mass index is 38.99 kg/m.  General: She is overweight, cooperative, alert, well developed, and in no acute distress. PSYCH: Has normal mood, affect and thought process.   Extremities: No edema.  Neurologic: No gross sensory or motor deficits. No tremors or fasciculations noted.    DIAGNOSTIC DATA REVIEWED:  BMET    Component Value Date/Time   NA 139 08/19/2023 1557   K 4.2 08/19/2023 1557   CL 105 08/19/2023 1557   CO2 22 08/19/2023 1557   GLUCOSE 90 08/19/2023 1557   GLUCOSE 95 04/21/2018 2111   BUN 14 08/19/2023 1557   CREATININE 0.76 08/19/2023 1557   CALCIUM 9.8 08/19/2023 1557   GFRNONAA 90 03/10/2020 1124   GFRAA 104 03/10/2020 1124   Lab Results  Component Value Date   HGBA1C 5.6 01/14/2023   HGBA1C 5.5 03/27/2020   Lab Results  Component Value Date   INSULIN  18.1 01/14/2023   INSULIN  13.2 03/27/2020   Lab Results  Component Value Date   TSH 1.790 01/14/2023   CBC    Component Value Date/Time   WBC 7.9 08/19/2023 1557   WBC 8.4 11/13/2015 1601   RBC 4.26 08/19/2023 1557   RBC  4.12 11/13/2015 1601   HGB 13.2 08/19/2023 1557   HCT 38.4 08/19/2023 1557   PLT 328 08/19/2023 1557   MCV 90 08/19/2023 1557   MCH 31.0 08/19/2023 1557   MCH 29.1 11/13/2015 1601   MCHC 34.4 08/19/2023 1557   MCHC 33.0 11/13/2015 1601   RDW 12.1 08/19/2023 1557   Iron Studies    Component Value Date/Time   IRON 59 05/08/2022 0823   TIBC 291 08/07/2021 1557   FERRITIN 110 05/08/2022 0823   IRONPCTSAT 20 08/07/2021 1557   Lipid Panel     Component Value Date/Time   CHOL 145 01/14/2023 0756   TRIG 55 01/14/2023 0756   HDL 61 01/14/2023 0756   LDLCALC 72 01/14/2023 0756   Hepatic Function Panel     Component Value Date/Time   PROT 7.4 08/19/2023 1557   ALBUMIN 4.3  08/19/2023 1557   AST 12 08/19/2023 1557   ALT 25 08/19/2023 1557   ALKPHOS 67 08/19/2023 1557   BILITOT 0.4 08/19/2023 1557      Component Value Date/Time   TSH 1.790 01/14/2023 0756   Nutritional Lab Results  Component Value Date   VD25OH 43.6 08/19/2023   VD25OH 39.4 01/14/2023   VD25OH 28.8 (L) 05/08/2022     ASSESSMENT AND PLAN  TREATMENT PLAN FOR OBESITY:  Recommended Dietary Goals  Tina is currently in the action stage of change. As such, her goal is to continue weight management plan. She has agreed to keeping a food journal and adhering to recommended goals of 1500-1600 calories and 100+ grams protein.  Behavioral Intervention  We discussed the following Behavioral Modification Strategies today: increasing lean protein intake to established goals, decreasing simple carbohydrates , increasing vegetables, increasing fiber rich foods, avoiding skipping meals, increasing water intake , and continue to work on maintaining a reduced calorie state, getting the recommended amount of protein, incorporating whole foods, making healthy choices, staying well hydrated and practicing mindfulness when eating..  Additional resources provided today: NA  Recommended Physical Activity Goals  Lilleigh has been advised to work up to 150 minutes of moderate intensity aerobic activity a week and strengthening exercises 2-3 times per week for cardiovascular health, weight loss maintenance and preservation of muscle mass.   She has agreed to Think about enjoyable ways to increase daily physical activity and overcoming barriers to exercise, Increase physical activity in their day and reduce sedentary time (increase NEAT)., and Work on scheduling and tracking physical activity.    Pharmacotherapy We discussed various medication options to help [facility] with her weight loss efforts and we both agreed to continue Zepbound 10mg .  Side effects discussed.  ASSOCIATED CONDITIONS ADDRESSED  TODAY  Action/Plan  Seasonal allergies -     Montelukast Sodium; Take 1 tablet (10 mg total) by mouth at bedtime.  Dispense: 30 tablet; Refill: 3.  Side effects discussed.  If notes any change in mood, depression or suicidal thoughts, to stop Singular and let me know.  Discussed black box warning.    Continue Claritin  and Flonase  Class 2 obesity due to excess calories with body mass index (BMI) of 38.0 to 38.9 in adult, unspecified whether serious comorbidity present -     Tirzepatide-Weight Management; Inject 10 mg into the skin once a week.  Dispense: 2 mL; Refill: 0         Return in about 4 weeks (around 11/06/2023).Aaron Aas She was informed of the importance of frequent follow up visits to maximize her success with intensive lifestyle modifications  for her multiple health conditions.   ATTESTASTION STATEMENTS:  Reviewed by clinician on day of visit: allergies, medications, problem list, medical history, surgical history, family history, social history, and previous encounter notes.     Crist Dominion. Wai Minotti FNP-C

## 2023-10-26 IMAGING — MR MR HEAD W/O CM
10 series · 48 of 48 positions shown · non-contrast
Comparison: CT head April 21, 2018.

CLINICAL DATA: Head and scalp pain.

EXAM:
MRI HEAD WITHOUT CONTRAST
TECHNIQUE: Multiplanar, multiecho pulse sequences of the brain and surrounding
structures were obtained without intravenous contrast.

[Series 4: T1 · sagittal · 5.0mm · 0.45mm/px · 3 of 25 slices shown]
[im 1/25]
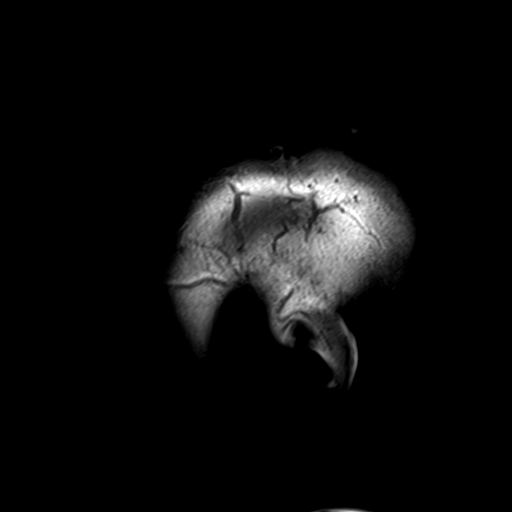
[im 13/25]
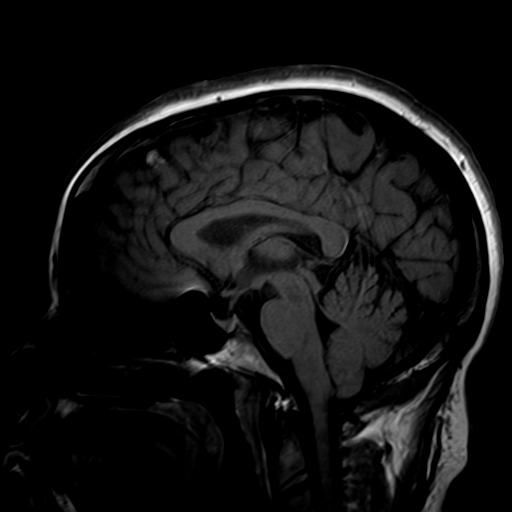
[im 25/25]
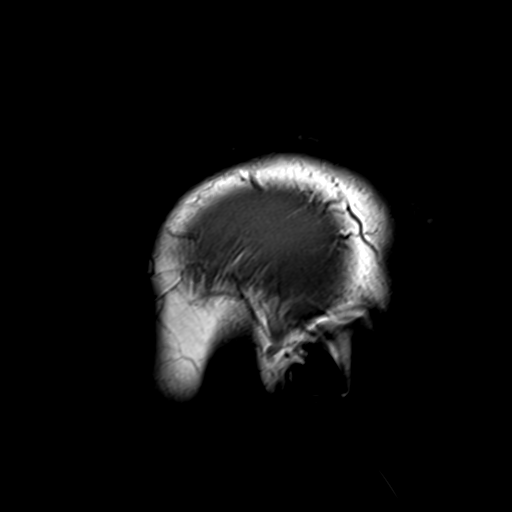

[Series 5: ax ep2d_diff_3 · axial · 3.0mm · 1.80mm/px · z∈[-109,+53]mm · 9 of 107 slices shown]
[im 1/107]
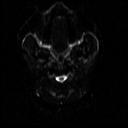
[im 14/107]
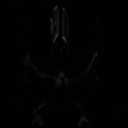
[im 27/107]
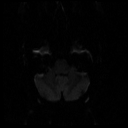
[im 40/107]
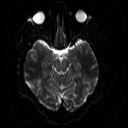
[im 54/107]
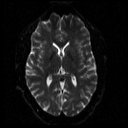
[im 67/107]
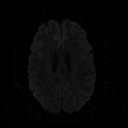
[im 80/107]
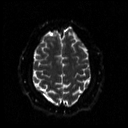
[im 93/107]
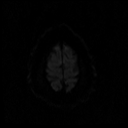
[im 107/107]
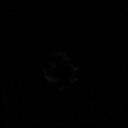

[Series 6: ax ep2d_diff_3_adc · axial · 3.0mm · 1.80mm/px · z∈[-109,+53]mm · 4 of 55 slices shown]
[im 1/55]
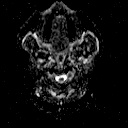
[im 19/55]
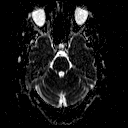
[im 37/55]
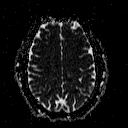
[im 55/55]
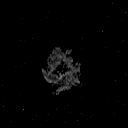

[Series 7: cor ep2d_diff · coronal · 5.0mm · 1.77mm/px · 4 of 57 slices shown]
[im 1/57]
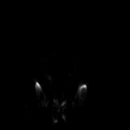
[im 19/57]
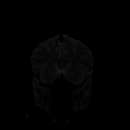
[im 38/57]
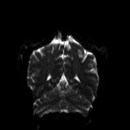
[im 57/57]
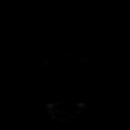

[Series 8: cor ep2d_diff_adc · coronal · 5.0mm · 1.77mm/px · 2 of 30 slices shown]
[im 1/30]
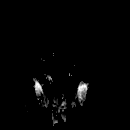
[im 30/30]
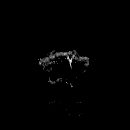

[Series 10: swi_images · axial · 2.0mm · 0.98mm/px · z∈[-108,+50]mm · 6 of 80 slices shown]
[im 1/80]
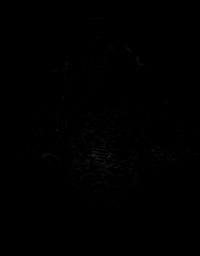
[im 16/80]
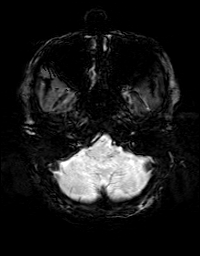
[im 32/80]
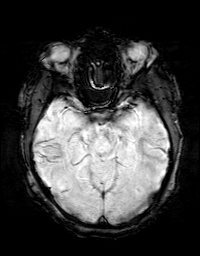
[im 48/80]
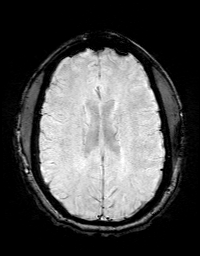
[im 64/80]
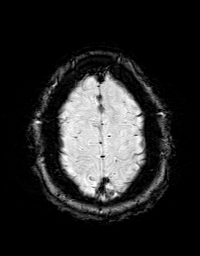
[im 80/80]
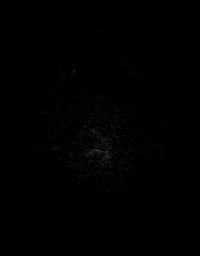

[Series 11: FLAIR · axial · 3.0mm · 0.43mm/px · z∈[-105,+47]mm · 3 of 40 slices shown]
[im 1/40]
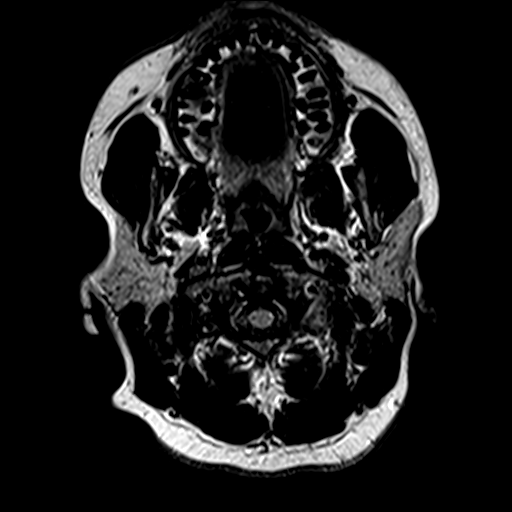
[im 20/40]
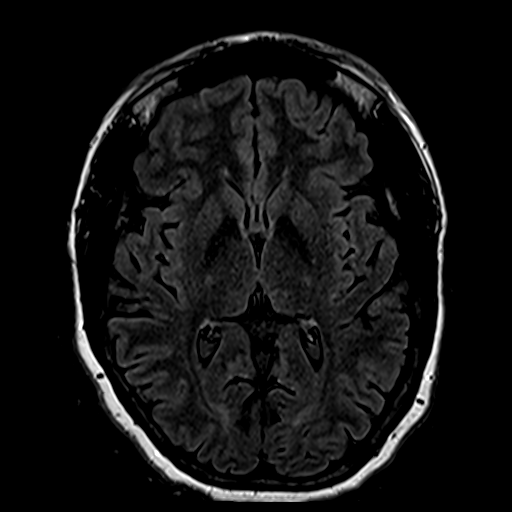
[im 40/40]
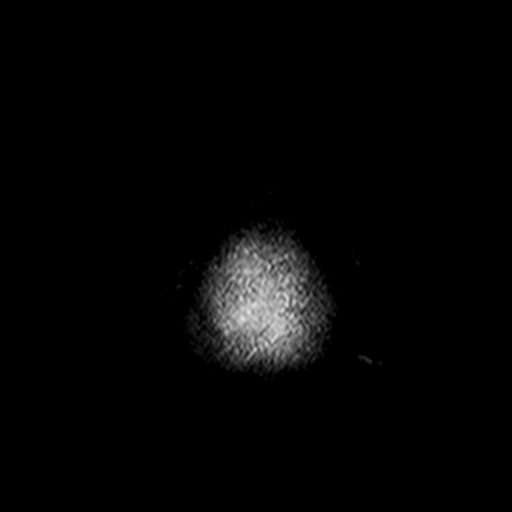

[Series 12: T2 · axial · 5.0mm · 0.65mm/px · z∈[-113,+55]mm · 2 of 29 slices shown (1 of 2)]
[im 1/29]
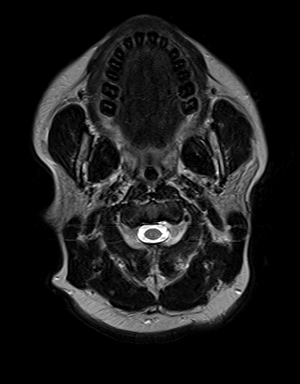
[im 29/29]
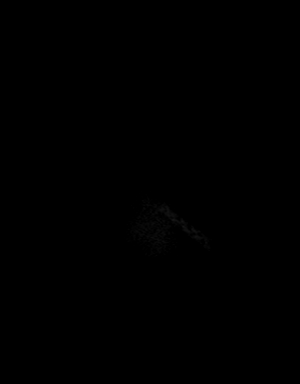

[Series 13: t1_mpr_tra · axial · 1.0mm · 0.72mm/px · z∈[-109,+50]mm · 13 of 160 slices shown]
[im 1/160]
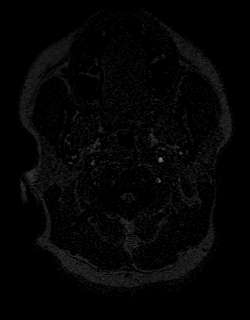
[im 14/160]
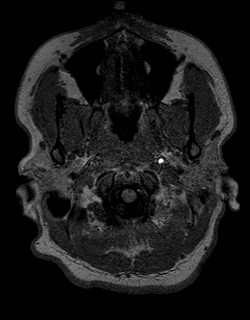
[im 27/160]
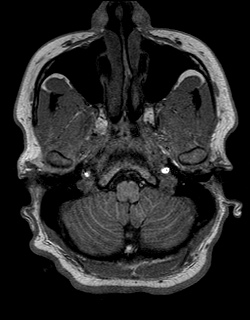
[im 40/160]
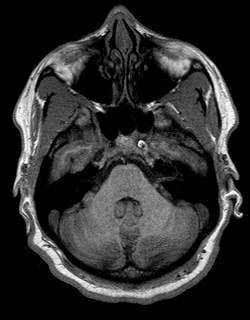
[im 54/160]
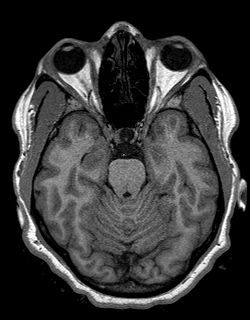
[im 67/160]
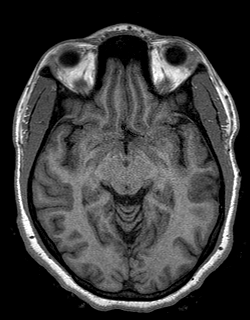
[im 80/160]
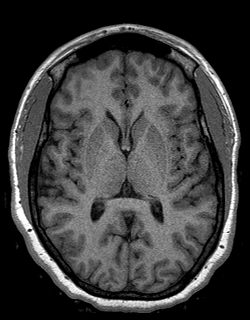
[im 93/160]
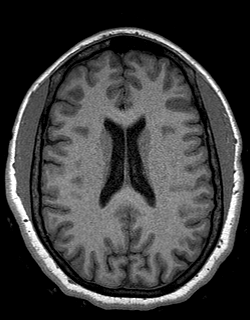
[im 107/160]
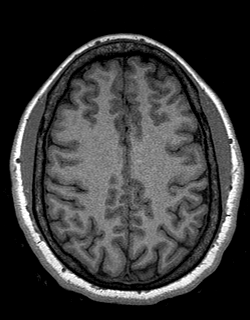
[im 120/160]
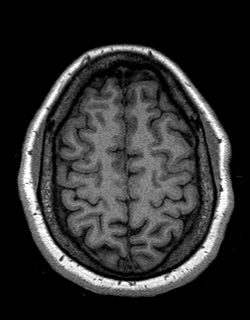
[im 133/160]
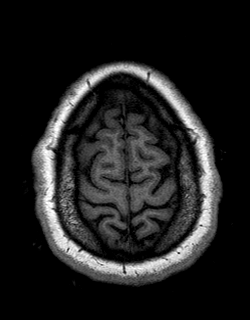
[im 146/160]
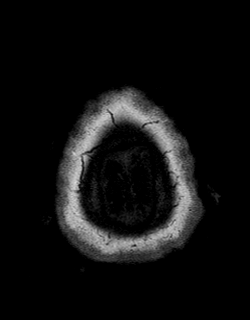
[im 160/160]
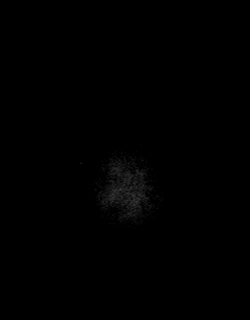

[Series 14: T2 · coronal · 5.0mm · 0.43mm/px · 2 of 28 slices shown (2 of 2)]
[im 1/28]
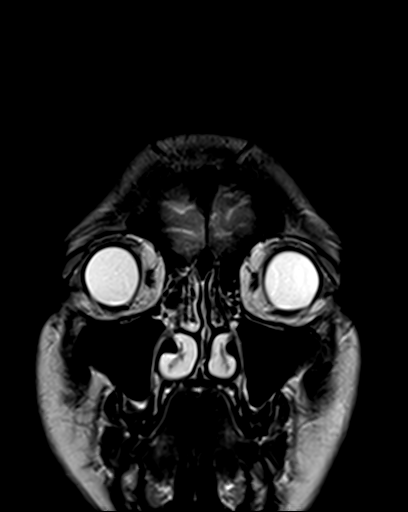
[im 28/28]
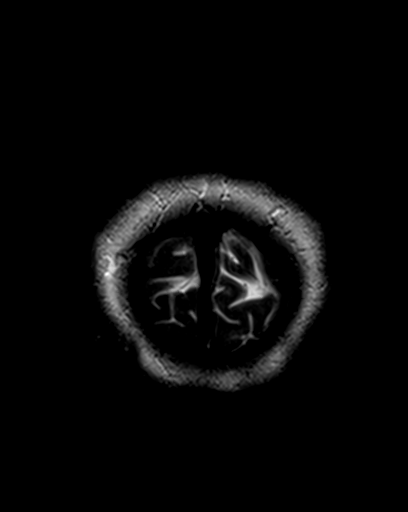

[48 of 48 positions shown; findings below may reference images not displayed]

FINDINGS: Brain: No acute infarction, hemorrhage, hydrocephalus, extra-axial
collection or mass lesion.

Vascular: Major arterial flow voids are maintained at the skull
base.

Skull and upper cervical spine: Normal marrow signal.

Sinuses/Orbits: Clear sinuses.  No acute orbital findings.

Other: No mastoid effusions.
IMPRESSION: Normal brain MRI.  No acute abnormality.

## 2023-10-29 ENCOUNTER — Other Ambulatory Visit: Payer: Self-pay | Admitting: Nurse Practitioner

## 2023-10-29 DIAGNOSIS — E559 Vitamin D deficiency, unspecified: Secondary | ICD-10-CM

## 2023-11-17 ENCOUNTER — Ambulatory Visit: Admitting: Nurse Practitioner

## 2023-11-20 ENCOUNTER — Ambulatory Visit: Admitting: Nurse Practitioner

## 2023-11-20 ENCOUNTER — Encounter: Payer: Self-pay | Admitting: Nurse Practitioner

## 2023-11-20 VITALS — BP 128/85 | HR 71 | Temp 98.5°F | Ht 69.0 in | Wt 271.0 lb

## 2023-11-20 DIAGNOSIS — L659 Nonscarring hair loss, unspecified: Secondary | ICD-10-CM | POA: Diagnosis not present

## 2023-11-20 DIAGNOSIS — Z6841 Body Mass Index (BMI) 40.0 and over, adult: Secondary | ICD-10-CM | POA: Diagnosis not present

## 2023-11-20 DIAGNOSIS — E66813 Obesity, class 3: Secondary | ICD-10-CM | POA: Diagnosis not present

## 2023-11-20 NOTE — Progress Notes (Signed)
 Office: [phone]  /  Fax: [phone]  WEIGHT SUMMARY AND BIOMETRICS  Weight Lost Since Last Visit: 0lb  Weight Gained Since Last Visit: 7lb   Vitals Temp: 98.5 F (36.9 C) BP: 128/85 Pulse Rate: 71 SpO2: 100 %   Anthropometric Measurements Height: 5' 9 (1.753 m) Weight: 271 lb (122.9 kg) BMI (Calculated): 40 Weight at Last Visit: 264lb Weight Lost Since Last Visit: 0lb Weight Gained Since Last Visit: 7lb Starting Weight: 245lb Total Weight Loss (lbs): 0 lb (0 kg)   Body Composition  Body Fat %: 46.6 % Fat Mass (lbs): 126.6 lbs Muscle Mass (lbs): 137.8 lbs Total Body Water (lbs): 108.4 lbs Visceral Fat Rating : 12   Other Clinical Data Fasting: Yes Labs: No Today's Visit #: 48 Starting Date: 03/27/20     HPI  Chief Complaint: OBESITY  Annise is here to discuss her progress with her obesity treatment plan. She is on the the Category 3 Plan and states she is following her eating plan approximately 30 % of the time. She states she is exercising 40 minutes 4 days per week.   Interval History:  Since last office visit she has gained 7 pounds.  She is unsure of protein, carbs, fat or calories intake.  She is exercising 4 days per week.    Pharmacotherapy for weight loss: She stopped taking Zepbound  10mg  3 weeks ago.  Both her and her hair dresser notes increased hair loss and she was concerned that Zepbound  maybe contributing to her hair loss.  Since stopping Zepbound  she is struggling with polyphagia and cravings.     Previous pharmacotherapy for medical weight loss:   She has tried [name] , Contrave , Wegovy , Qsymia , Victoza  and Topamax .  -She stopped Topamax  due to daytime sleepiness.  -Feels Wegovy  worked better for her by helping reduce her appetite and cravings more and she lost more weight while taking it.  -She was unable to continue Wegovy  and [name]  due to cost.  -Victoza     Bariatric surgery:  Patient has not had bariatric surgery.       PHYSICAL EXAM:  Blood pressure 128/85, pulse 71, temperature 98.5 F (36.9 C), height 5' 9 (1.753 m), weight 271 lb (122.9 kg), SpO2 100%. Body mass index is 40.02 kg/m.  General: She is overweight, cooperative, alert, well developed, and in no acute distress. PSYCH: Has normal mood, affect and thought process.   Extremities: No edema.  Neurologic: No gross sensory or motor deficits. No tremors or fasciculations noted.    DIAGNOSTIC DATA REVIEWED:  BMET    Component Value Date/Time   NA 139 08/19/2023 1557   K 4.2 08/19/2023 1557   CL 105 08/19/2023 1557   CO2 22 08/19/2023 1557   GLUCOSE 90 08/19/2023 1557   GLUCOSE 95 04/21/2018 2111   BUN 14 08/19/2023 1557   CREATININE 0.76 08/19/2023 1557   CALCIUM 9.8 08/19/2023 1557   GFRNONAA 90 03/10/2020 1124   GFRAA 104 03/10/2020 1124   Lab Results  Component Value Date   HGBA1C 5.6 01/14/2023   HGBA1C 5.5 03/27/2020   Lab Results  Component Value Date   INSULIN  18.1 01/14/2023   INSULIN  13.2 03/27/2020   Lab Results  Component Value Date   TSH 1.790 01/14/2023   CBC    Component Value Date/Time   WBC 7.9 08/19/2023 1557   WBC 8.4 11/13/2015 1601   RBC 4.26 08/19/2023 1557   RBC 4.12 11/13/2015 1601   HGB 13.2 08/19/2023 1557   HCT 38.4  08/19/2023 1557   PLT 328 08/19/2023 1557   MCV 90 08/19/2023 1557   MCH 31.0 08/19/2023 1557   MCH 29.1 11/13/2015 1601   MCHC 34.4 08/19/2023 1557   MCHC 33.0 11/13/2015 1601   RDW 12.1 08/19/2023 1557   Iron Studies    Component Value Date/Time   IRON 59 05/08/2022 0823   TIBC 291 08/07/2021 1557   FERRITIN 110 05/08/2022 0823   IRONPCTSAT 20 08/07/2021 1557   Lipid Panel     Component Value Date/Time   CHOL 145 01/14/2023 0756   TRIG 55 01/14/2023 0756   HDL 61 01/14/2023 0756   LDLCALC 72 01/14/2023 0756   Hepatic Function Panel     Component Value Date/Time   PROT 7.4 08/19/2023 1557   ALBUMIN 4.3 08/19/2023 1557   AST 12 08/19/2023 1557   ALT  25 08/19/2023 1557   ALKPHOS 67 08/19/2023 1557   BILITOT 0.4 08/19/2023 1557      Component Value Date/Time   TSH 1.790 01/14/2023 0756   Nutritional Lab Results  Component Value Date   VD25OH 43.6 08/19/2023   VD25OH 39.4 01/14/2023   VD25OH 28.8 (L) 05/08/2022     ASSESSMENT AND PLAN  TREATMENT PLAN FOR OBESITY:  Recommended Dietary Goals  Roxsana is currently in the action stage of change. As such, her goal is to continue weight management plan. She has agreed to keeping a food journal and adhering to recommended goals of 1500-1700 calories and 110+ grams of protein. I've her to track and will review macros at next visit.   Behavioral Intervention  We discussed the following Behavioral Modification Strategies today: increasing lean protein intake to established goals, decreasing simple carbohydrates , increasing vegetables, increasing fiber rich foods, increasing water intake , and continue to work on maintaining a reduced calorie state, getting the recommended amount of protein, incorporating whole foods, making healthy choices, staying well hydrated and practicing mindfulness when eating..  Additional resources provided today: NA  Recommended Physical Activity Goals  Trameka has been advised to work up to 150 minutes of moderate intensity aerobic activity a week and strengthening exercises 2-3 times per week for cardiovascular health, weight loss maintenance and preservation of muscle mass.   She has agreed to Think about enjoyable ways to increase daily physical activity and overcoming barriers to exercise, Increase physical activity in their day and reduce sedentary time (increase NEAT)., and continue to gradually increase the amount and intensity of exercise routine   Pharmacotherapy We discussed various medication options to help [facility] with her weight loss efforts and we both agreed to not continue Zepbound  at this time.  Will obtain labs and go from there.  She  has an appt with Dr. Mollie on 12/26/23.  Will consider restarting Zepbound  in the future based upon lab results and visit with Dr. Mollie.    ASSOCIATED CONDITIONS ADDRESSED TODAY  Action/Plan  Hair loss -     Comprehensive metabolic panel with GFR -     CBC with Differential/Platelet -     TSH -     Vitamin B12 -     Ferritin -     Iron -     Testosterone -     Prolactin -     DHEA Increase protein intake, start a MVI Keep appt with Dr. Mollie   Class 3 severe obesity due to excess calories with serious comorbidity and body mass index (BMI) of 40.0 to 44.9 in adult  Return in about 4 weeks (around 12/18/2023).SABRA She was informed of the importance of frequent follow up visits to maximize her success with intensive lifestyle modifications for her multiple health conditions.   ATTESTASTION STATEMENTS:  Reviewed by clinician on day of visit: allergies, medications, problem list, medical history, surgical history, family history, social history, and previous encounter notes.   Time spent on visit including pre-visit chart review and post-visit care and charting was 30 minutes.    Corean SAUNDERS. Makenzie Vittorio FNP-C

## 2023-11-23 ENCOUNTER — Encounter: Payer: Self-pay | Admitting: Nurse Practitioner

## 2023-11-24 ENCOUNTER — Other Ambulatory Visit: Payer: Self-pay | Admitting: Nurse Practitioner

## 2023-11-24 DIAGNOSIS — Z6841 Body Mass Index (BMI) 40.0 and over, adult: Secondary | ICD-10-CM

## 2023-11-24 LAB — IRON: Iron: 73 ug/dL (ref 27–159)

## 2023-11-24 LAB — CBC WITH DIFFERENTIAL/PLATELET
Basophils Absolute: 0 10*3/uL (ref 0.0–0.2)
Basos: 1 %
EOS (ABSOLUTE): 0 10*3/uL (ref 0.0–0.4)
Eos: 1 %
Hematocrit: 37.1 % (ref 34.0–46.6)
Hemoglobin: 12 g/dL (ref 11.1–15.9)
Immature Grans (Abs): 0 10*3/uL (ref 0.0–0.1)
Immature Granulocytes: 0 %
Lymphocytes Absolute: 2.9 10*3/uL (ref 0.7–3.1)
Lymphs: 44 %
MCH: 30.5 pg (ref 26.6–33.0)
MCHC: 32.3 g/dL (ref 31.5–35.7)
MCV: 94 fL (ref 79–97)
Monocytes Absolute: 0.4 10*3/uL (ref 0.1–0.9)
Monocytes: 6 %
Neutrophils Absolute: 3.2 10*3/uL (ref 1.4–7.0)
Neutrophils: 48 %
Platelets: 342 10*3/uL (ref 150–450)
RBC: 3.94 x10E6/uL (ref 3.77–5.28)
RDW: 12.4 % (ref 11.7–15.4)
WBC: 6.6 10*3/uL (ref 3.4–10.8)

## 2023-11-24 LAB — COMPREHENSIVE METABOLIC PANEL WITH GFR
ALT: 30 IU/L (ref 0–32)
AST: 9 IU/L (ref 0–40)
Albumin: 4 g/dL (ref 3.9–4.9)
Alkaline Phosphatase: 54 IU/L (ref 44–121)
BUN/Creatinine Ratio: 11 (ref 9–23)
BUN: 9 mg/dL (ref 6–20)
Bilirubin Total: 0.6 mg/dL (ref 0.0–1.2)
CO2: 21 mmol/L (ref 20–29)
Calcium: 9.3 mg/dL (ref 8.7–10.2)
Chloride: 105 mmol/L (ref 96–106)
Creatinine, Ser: 0.82 mg/dL (ref 0.57–1.00)
Globulin, Total: 2.8 g/dL (ref 1.5–4.5)
Glucose: 81 mg/dL (ref 70–99)
Potassium: 4.6 mmol/L (ref 3.5–5.2)
Sodium: 138 mmol/L (ref 134–144)
Total Protein: 6.8 g/dL (ref 6.0–8.5)
eGFR: 95 mL/min/{1.73_m2} (ref >59)

## 2023-11-24 LAB — FERRITIN: Ferritin: 68 ng/mL (ref 15–150)

## 2023-11-24 LAB — VITAMIN B12: Vitamin B-12: 463 pg/mL (ref 232–1245)

## 2023-11-24 LAB — DHEA: Dehydroepiandrosterone (DHEA): 123 ng/dL (ref 31–701)

## 2023-11-24 LAB — TESTOSTERONE: Testosterone: 25 ng/dL (ref 8–60)

## 2023-11-24 LAB — PROLACTIN: Prolactin: 7.9 ng/mL (ref 4.8–33.4)

## 2023-11-24 LAB — TSH: TSH: 1.71 u[IU]/mL (ref 0.450–4.500)

## 2023-11-24 MED ORDER — TIRZEPATIDE-WEIGHT MANAGEMENT 2.5 MG/0.5ML ~~LOC~~ SOLN: 2.5000 mg | SUBCUTANEOUS | 0 refills | Status: DC

## 2023-12-10 ENCOUNTER — Other Ambulatory Visit: Payer: Self-pay | Admitting: Nurse Practitioner

## 2023-12-10 DIAGNOSIS — Z6841 Body Mass Index (BMI) 40.0 and over, adult: Secondary | ICD-10-CM

## 2023-12-23 ENCOUNTER — Ambulatory Visit: Admitting: Nurse Practitioner

## 2023-12-24 ENCOUNTER — Ambulatory Visit: Admitting: Nurse Practitioner

## 2023-12-24 ENCOUNTER — Encounter: Payer: Self-pay | Admitting: Nurse Practitioner

## 2023-12-24 VITALS — BP 131/86 | HR 94 | Temp 98.5°F | Ht 69.0 in | Wt 271.0 lb

## 2023-12-24 DIAGNOSIS — I10 Essential (primary) hypertension: Secondary | ICD-10-CM

## 2023-12-24 DIAGNOSIS — L659 Nonscarring hair loss, unspecified: Secondary | ICD-10-CM

## 2023-12-24 DIAGNOSIS — E66813 Obesity, class 3: Secondary | ICD-10-CM | POA: Diagnosis not present

## 2023-12-24 DIAGNOSIS — E559 Vitamin D deficiency, unspecified: Secondary | ICD-10-CM

## 2023-12-24 DIAGNOSIS — Z6841 Body Mass Index (BMI) 40.0 and over, adult: Secondary | ICD-10-CM

## 2023-12-24 MED ORDER — WEGOVY 0.5 MG/0.5ML ~~LOC~~ SOAJ: 0.5000 mg | SUBCUTANEOUS | 0 refills | Status: DC

## 2023-12-24 MED ORDER — HYDROCHLOROTHIAZIDE 25 MG PO TABS: 25.0000 mg | ORAL_TABLET | Freq: Every day | ORAL | 0 refills | Status: DC

## 2023-12-24 MED ORDER — VITAMIN D (ERGOCALCIFEROL) 1.25 MG (50000 UNIT) PO CAPS: 50000.0000 [IU] | ORAL_CAPSULE | ORAL | 0 refills | Status: DC

## 2023-12-24 NOTE — Progress Notes (Signed)
 Office: [phone]  /  Fax: [phone]  WEIGHT SUMMARY AND BIOMETRICS  Weight Lost Since Last Visit: 0lb  Weight Gained Since Last Visit: 0lb   Vitals Temp: 98.5 F (36.9 C) BP: 131/86 Pulse Rate: 94 SpO2: 100 %   Anthropometric Measurements Height: 5' 9 (1.753 m) Weight: 271 lb (122.9 kg) BMI (Calculated): 40 Weight at Last Visit: 271lb Weight Lost Since Last Visit: 0lb Weight Gained Since Last Visit: 0lb Starting Weight: 245lb Total Weight Loss (lbs): 0 lb (0 kg)   Body Composition  Body Fat %: 46 % Fat Mass (lbs): 124.6 lbs Muscle Mass (lbs): 139 lbs Total Body Water (lbs): 107 lbs Visceral Fat Rating : 12   Other Clinical Data Fasting: Yes Labs: No Today's Visit #: 31 Starting Date: 03/27/20     HPI  Chief Complaint: OBESITY  Brittany Elliott is here to discuss her progress with her obesity treatment plan. She is on the the Category 3 Plan and states she is following her eating plan approximately 40 % of the time. She states she is exercising 60 minutes 4 days per week.   Interval History:  Since last office visit she has maintained her weight.  She has been traveling-beach and [place] .  She is drinking water with collagen powder.  She is walking 4 days per week.     Pharmacotherapy for weight loss: She is currently taking Zepbound  2.5mg  for medical weight loss.  Denies side effects.  Hair loss has improved. She overall feels that Wegovy  worked better for her and would like to retry Wegovy .      Previous pharmacotherapy for medical weight loss:   She has tried [name] , Contrave , Wegovy , Qsymia , Victoza , Zepbound  and Topamax .  -She stopped Topamax  due to daytime sleepiness.  -Feels Wegovy  worked better for her by helping reduce her appetite and cravings more and she lost more weight while taking it.  -She was unable to continue Wegovy  and [name]  due to cost.  -Victoza     Bariatric surgery:  Patient has not had bariatric surgery.      Hair  loss Improving.  She is taking a MVI and hair, skin and nails.    Hypertension Hypertension stable.  Medication(s): HCTZ 25mg . Denies side effects.   Denies chest pain, palpitations and SOB.  BP Readings from Last 3 Encounters:  12/24/23 131/86  11/20/23 128/85  10/09/23 130/89   Lab Results  Component Value Date   CREATININE 0.82 11/20/2023   CREATININE 0.76 08/19/2023   CREATININE 0.82 01/14/2023    Vit D deficiency  She is taking Vit D 50,000 IU weekly.  Denies side effects.  Denies nausea, vomiting or muscle weakness.    Lab Results  Component Value Date   VD25OH 43.6 08/19/2023   VD25OH 39.4 01/14/2023   VD25OH 28.8 (L) 05/08/2022     PHYSICAL EXAM:  Blood pressure 131/86, pulse 94, temperature 98.5 F (36.9 C), height 5' 9 (1.753 m), weight 271 lb (122.9 kg), SpO2 100%. Body mass index is 40.02 kg/m.  General: She is overweight, cooperative, alert, well developed, and in no acute distress. PSYCH: Has normal mood, affect and thought process.   Extremities: No edema.  Neurologic: No gross sensory or motor deficits. No tremors or fasciculations noted.    DIAGNOSTIC DATA REVIEWED:  BMET    Component Value Date/Time   NA 138 11/20/2023 1045   K 4.6 11/20/2023 1045   CL 105 11/20/2023 1045   CO2 21 11/20/2023 1045   GLUCOSE 81 11/20/2023 1045  GLUCOSE 95 04/21/2018 2111   BUN 9 11/20/2023 1045   CREATININE 0.82 11/20/2023 1045   CALCIUM 9.3 11/20/2023 1045   GFRNONAA 90 03/10/2020 1124   GFRAA 104 03/10/2020 1124   Lab Results  Component Value Date   HGBA1C 5.6 01/14/2023   HGBA1C 5.5 03/27/2020   Lab Results  Component Value Date   INSULIN  18.1 01/14/2023   INSULIN  13.2 03/27/2020   Lab Results  Component Value Date   TSH 1.710 11/20/2023   CBC    Component Value Date/Time   WBC 6.6 11/20/2023 1045   WBC 8.4 11/13/2015 1601   RBC 3.94 11/20/2023 1045   RBC 4.12 11/13/2015 1601   HGB 12.0 11/20/2023 1045   HCT 37.1 11/20/2023 1045    PLT 342 11/20/2023 1045   MCV 94 11/20/2023 1045   MCH 30.5 11/20/2023 1045   MCH 29.1 11/13/2015 1601   MCHC 32.3 11/20/2023 1045   MCHC 33.0 11/13/2015 1601   RDW 12.4 11/20/2023 1045   Iron Studies    Component Value Date/Time   IRON 73 11/20/2023 1045   TIBC 291 08/07/2021 1557   FERRITIN 68 11/20/2023 1045   IRONPCTSAT 20 08/07/2021 1557   Lipid Panel     Component Value Date/Time   CHOL 145 01/14/2023 0756   TRIG 55 01/14/2023 0756   HDL 61 01/14/2023 0756   LDLCALC 72 01/14/2023 0756   Hepatic Function Panel     Component Value Date/Time   PROT 6.8 11/20/2023 1045   ALBUMIN 4.0 11/20/2023 1045   AST 9 11/20/2023 1045   ALT 30 11/20/2023 1045   ALKPHOS 54 11/20/2023 1045   BILITOT 0.6 11/20/2023 1045      Component Value Date/Time   TSH 1.710 11/20/2023 1045   Nutritional Lab Results  Component Value Date   VD25OH 43.6 08/19/2023   VD25OH 39.4 01/14/2023   VD25OH 28.8 (L) 05/08/2022     ASSESSMENT AND PLAN  TREATMENT PLAN FOR OBESITY:  Recommended Dietary Goals  Brittany Elliott is currently in the action stage of change. As such, her goal is to continue weight management plan. She has agreed to keeping a food journal and adhering to recommended goals of 1500-1600 calories and 100+ grams of protein.  Behavioral Intervention  We discussed the following Behavioral Modification Strategies today: increasing lean protein intake to established goals, decreasing simple carbohydrates , increasing vegetables, increasing fiber rich foods, increasing water intake , work on meal planning and preparation, work on tracking and journaling calories using tracking application, reading food labels , keeping healthy foods at home, and continue to work on maintaining a reduced calorie state, getting the recommended amount of protein, incorporating whole foods, making healthy choices, staying well hydrated and practicing mindfulness when eating..  Additional resources provided  today: NA  Recommended Physical Activity Goals  Brittany Elliott has been advised to work up to 150 minutes of moderate intensity aerobic activity a week and strengthening exercises 2-3 times per week for cardiovascular health, weight loss maintenance and preservation of muscle mass.   She has agreed to Think about enjoyable ways to increase daily physical activity and overcoming barriers to exercise, Increase physical activity in their day and reduce sedentary time (increase NEAT)., Start strengthening exercises with a goal of 2-3 sessions a week , and continue to gradually increase the amount and intensity of exercise routine   Pharmacotherapy We discussed various medication options to help Brittany Elliott with her weight loss efforts and we both agreed to stop Zepbound  and start  Wegovy  0.5mg .  Side effects discussed.  ASSOCIATED CONDITIONS ADDRESSED TODAY  Action/Plan  Essential hypertension -     Continue hydroCHLOROthiazide ; Take 1 tablet (25 mg total) by mouth daily.  Dispense: 30 tablet; Refill: .  Side effects discussed  Hair loss Improving.  Will continue to monitor  Vitamin D  deficiency -     Vitamin D  (Ergocalciferol ); Take 1 capsule (50,000 Units total) by mouth once a week.  Dispense: 5 capsule; Refill: 0  Obesity, Class III, BMI 40-49.9 (morbid obesity) -     Wegovy ; Inject 0.5 mg into the skin once a week.  Dispense: 2 mL; Refill: 0      Labs reviewed in chart with patient from 11/20/23   Return in about 4 weeks (around 01/21/2024).SABRA She was informed of the importance of frequent follow up visits to maximize her success with intensive lifestyle modifications for her multiple health conditions.   ATTESTASTION STATEMENTS:  Reviewed by clinician on day of visit: allergies, medications, problem list, medical history, surgical history, family history, social history, and previous encounter notes.     Brittany Elliott. Brittany Gora FNP-C

## 2024-01-13 ENCOUNTER — Ambulatory Visit: Admitting: Nurse Practitioner

## 2024-01-15 ENCOUNTER — Other Ambulatory Visit: Payer: Self-pay | Admitting: Nurse Practitioner

## 2024-01-15 DIAGNOSIS — I10 Essential (primary) hypertension: Secondary | ICD-10-CM

## 2024-01-27 ENCOUNTER — Encounter: Payer: Self-pay | Admitting: Nurse Practitioner

## 2024-01-27 ENCOUNTER — Ambulatory Visit: Admitting: Nurse Practitioner

## 2024-01-27 VITALS — BP 142/87 | HR 92 | Temp 98.2°F | Ht 69.0 in | Wt 269.0 lb

## 2024-01-27 DIAGNOSIS — E559 Vitamin D deficiency, unspecified: Secondary | ICD-10-CM | POA: Diagnosis not present

## 2024-01-27 DIAGNOSIS — Z6839 Body mass index (BMI) 39.0-39.9, adult: Secondary | ICD-10-CM | POA: Diagnosis not present

## 2024-01-27 DIAGNOSIS — E66812 Obesity, class 2: Secondary | ICD-10-CM

## 2024-01-27 DIAGNOSIS — I10 Essential (primary) hypertension: Secondary | ICD-10-CM

## 2024-01-27 MED ORDER — VITAMIN D (ERGOCALCIFEROL) 1.25 MG (50000 UNIT) PO CAPS
50000.0000 [IU] | ORAL_CAPSULE | ORAL | 0 refills | Status: DC
Start: 2024-01-27 — End: 2024-02-25

## 2024-01-27 MED ORDER — HYDROCHLOROTHIAZIDE 25 MG PO TABS: 25.0000 mg | ORAL_TABLET | Freq: Every day | ORAL | 0 refills | Status: DC

## 2024-01-27 NOTE — Progress Notes (Signed)
 Office: [phone]  /  Fax: [phone]  WEIGHT SUMMARY AND BIOMETRICS  Weight Lost Since Last Visit: 2lb  Weight Gained Since Last Visit: 0lb   Vitals Temp: 98.2 F (36.8 C) BP: (!) 142/87 Pulse Rate: 92 SpO2: 100 %   Anthropometric Measurements Height: 5' 9 (1.753 m) Weight: 269 lb (122 kg) BMI (Calculated): 39.71 Weight at Last Visit: 271lb Weight Lost Since Last Visit: 2lb Weight Gained Since Last Visit: 0lb Starting Weight: 245lb Total Weight Loss (lbs): 0 lb (0 kg)   Body Composition  Body Fat %: 45.2 % Fat Mass (lbs): 122 lbs Muscle Mass (lbs): 140.4 lbs Total Body Water (lbs): 104.6 lbs Visceral Fat Rating : 12   Other Clinical Data Fasting: No Labs: No Today's Visit #: 50 Starting Date: 03/27/20     HPI  Chief Complaint: OBESITY  Trystin is here to discuss her progress with her obesity treatment plan. She is on the the Category 3 Plan and states she is following her eating plan approximately 40 % of the time. She states she is exercising 35 minutes 4 days per week.   Interval History:  Since last office visit she has lost 2 pounds. She is eating eggs and bacon for BF, skipping lunch and dinner a protein and vegetable.  She is drinking water and a protein shake daily.  She restarted going back to the gym 3 days per week since her last visit.   Pharmacotherapy for weight loss: She is currently taking Wegovy  due to cost.     Previous pharmacotherapy for medical weight loss:   She has tried [name] , Contrave , Wegovy , Qsymia , Victoza , Zepbound  and Topamax .  -She stopped Topamax  due to daytime sleepiness.  -Feels Wegovy  worked better for her by helping reduce her appetite and cravings more and she lost more weight while taking it.  -She was unable to continue Wegovy  and [name]  due to cost.  -Victoza     Bariatric surgery:  Patient has not had bariatric surgery.     Hypertension Hypertension BP is elevated today.  Medication(s):  hydrochlorothiazide  25mg . Denies side effects.   Denies chest pain, palpitations and SOB.  BP Readings from Last 3 Encounters:  01/27/24 (!) 142/87  12/24/23 131/86  11/20/23 128/85   Lab Results  Component Value Date   CREATININE 0.82 11/20/2023   CREATININE 0.76 08/19/2023   CREATININE 0.82 01/14/2023       Vit D deficiency  She is taking Vit D 50,000 IU weekly.  Denies side effects.  Denies nausea, vomiting or muscle weakness.    Lab Results  Component Value Date   VD25OH 43.6 08/19/2023   VD25OH 39.4 01/14/2023   VD25OH 28.8 (L) 05/08/2022     PHYSICAL EXAM:  Blood pressure (!) 142/87, pulse 92, temperature 98.2 F (36.8 C), height 5' 9 (1.753 m), weight 269 lb (122 kg), SpO2 100%. Body mass index is 39.72 kg/m.  General: She is overweight, cooperative, alert, well developed, and in no acute distress. PSYCH: Has normal mood, affect and thought process.   Extremities: No edema.  Neurologic: No gross sensory or motor deficits. No tremors or fasciculations noted.    DIAGNOSTIC DATA REVIEWED:  BMET    Component Value Date/Time   NA 138 11/20/2023 1045   K 4.6 11/20/2023 1045   CL 105 11/20/2023 1045   CO2 21 11/20/2023 1045   GLUCOSE 81 11/20/2023 1045   GLUCOSE 95 04/21/2018 2111   BUN 9 11/20/2023 1045   CREATININE 0.82 11/20/2023 1045  CALCIUM 9.3 11/20/2023 1045   GFRNONAA 90 03/10/2020 1124   GFRAA 104 03/10/2020 1124   Lab Results  Component Value Date   HGBA1C 5.6 01/14/2023   HGBA1C 5.5 03/27/2020   Lab Results  Component Value Date   INSULIN  18.1 01/14/2023   INSULIN  13.2 03/27/2020   Lab Results  Component Value Date   TSH 1.710 11/20/2023   CBC    Component Value Date/Time   WBC 6.6 11/20/2023 1045   WBC 8.4 11/13/2015 1601   RBC 3.94 11/20/2023 1045   RBC 4.12 11/13/2015 1601   HGB 12.0 11/20/2023 1045   HCT 37.1 11/20/2023 1045   PLT 342 11/20/2023 1045   MCV 94 11/20/2023 1045   MCH 30.5 11/20/2023 1045   MCH 29.1  11/13/2015 1601   MCHC 32.3 11/20/2023 1045   MCHC 33.0 11/13/2015 1601   RDW 12.4 11/20/2023 1045   Iron Studies    Component Value Date/Time   IRON 73 11/20/2023 1045   TIBC 291 08/07/2021 1557   FERRITIN 68 11/20/2023 1045   IRONPCTSAT 20 08/07/2021 1557   Lipid Panel     Component Value Date/Time   CHOL 145 01/14/2023 0756   TRIG 55 01/14/2023 0756   HDL 61 01/14/2023 0756   LDLCALC 72 01/14/2023 0756   Hepatic Function Panel     Component Value Date/Time   PROT 6.8 11/20/2023 1045   ALBUMIN 4.0 11/20/2023 1045   AST 9 11/20/2023 1045   ALT 30 11/20/2023 1045   ALKPHOS 54 11/20/2023 1045   BILITOT 0.6 11/20/2023 1045      Component Value Date/Time   TSH 1.710 11/20/2023 1045   Nutritional Lab Results  Component Value Date   VD25OH 43.6 08/19/2023   VD25OH 39.4 01/14/2023   VD25OH 28.8 (L) 05/08/2022     ASSESSMENT AND PLAN  TREATMENT PLAN FOR OBESITY:  Recommended Dietary Goals  Audra is currently in the action stage of change. As such, her goal is to continue weight management plan. She has agreed to keeping a food journal and adhering to recommended goals of 1500-1600 calories and 90+ grams protein.  Behavioral Intervention  We discussed the following Behavioral Modification Strategies today: increasing lean protein intake to established goals, decreasing simple carbohydrates , increasing vegetables, increasing fiber rich foods, avoiding skipping meals, increasing water intake , work on tracking and journaling calories using tracking application, continue to work on maintaining a reduced calorie state, getting the recommended amount of protein, incorporating whole foods, making healthy choices, staying well hydrated and practicing mindfulness when eating., and increase protein intake, fibrous foods (25 grams per day for women, 30 grams for men) and water to improve satiety and decrease hunger signals. .  Additional resources provided today:  NA  Recommended Physical Activity Goals  Rossie has been advised to work up to 150 minutes of moderate intensity aerobic activity a week and strengthening exercises 2-3 times per week for cardiovascular health, weight loss maintenance and preservation of muscle mass.   She has agreed to Think about enjoyable ways to increase daily physical activity and overcoming barriers to exercise, Increase physical activity in their day and reduce sedentary time (increase NEAT)., Increase volume of physical activity to a goal of 240 minutes a week, and Combine aerobic and strengthening exercises for efficiency and improved cardiometabolic health.   Pharmacotherapy We discussed various medication options to help [facility] with her weight loss efforts and we both agreed to hold off starting meds due to cost.  ASSOCIATED CONDITIONS ADDRESSED TODAY  Action/Plan  Essential hypertension -     Continue hydroCHLOROthiazide ; Take 1 tablet (25 mg total) by mouth daily.  Dispense: 30 tablet; Refill: 0  Vitamin D  deficiency -     Vitamin D  (Ergocalciferol ); Take 1 capsule (50,000 Units total) by mouth once a week.  Dispense: 5 capsule; Refill: 0  Class 2 severe obesity due to excess calories with serious comorbidity and body mass index (BMI) of 39.0 to 39.9 in adult [facility])         Return in about 4 weeks (around 02/24/2024).SABRA She was informed of the importance of frequent follow up visits to maximize her success with intensive lifestyle modifications for her multiple health conditions.   ATTESTASTION STATEMENTS:  Reviewed by clinician on day of visit: allergies, medications, problem list, medical history, surgical history, family history, social history, and previous encounter notes.    Corean SAUNDERS. Jacquelin Krajewski FNP-C

## 2024-02-22 ENCOUNTER — Other Ambulatory Visit: Payer: Self-pay | Admitting: Nurse Practitioner

## 2024-02-22 DIAGNOSIS — I10 Essential (primary) hypertension: Secondary | ICD-10-CM

## 2024-02-25 ENCOUNTER — Ambulatory Visit: Admitting: Nurse Practitioner

## 2024-02-25 ENCOUNTER — Encounter: Payer: Self-pay | Admitting: Nurse Practitioner

## 2024-02-25 VITALS — BP 138/88 | HR 81 | Temp 98.3°F | Ht 69.0 in | Wt 269.0 lb

## 2024-02-25 DIAGNOSIS — I10 Essential (primary) hypertension: Secondary | ICD-10-CM | POA: Diagnosis not present

## 2024-02-25 DIAGNOSIS — E66812 Obesity, class 2: Secondary | ICD-10-CM

## 2024-02-25 DIAGNOSIS — E559 Vitamin D deficiency, unspecified: Secondary | ICD-10-CM

## 2024-02-25 DIAGNOSIS — Z6839 Body mass index (BMI) 39.0-39.9, adult: Secondary | ICD-10-CM

## 2024-02-25 MED ORDER — HYDROCHLOROTHIAZIDE 25 MG PO TABS: 25.0000 mg | ORAL_TABLET | Freq: Every day | ORAL | 0 refills | Status: DC

## 2024-02-25 MED ORDER — VITAMIN D (ERGOCALCIFEROL) 1.25 MG (50000 UNIT) PO CAPS: 50000.0000 [IU] | ORAL_CAPSULE | ORAL | 0 refills | Status: DC

## 2024-02-25 MED ORDER — WEGOVY 0.25 MG/0.5ML ~~LOC~~ SOAJ: 0.2500 mg | SUBCUTANEOUS | 0 refills | Status: DC

## 2024-02-25 NOTE — Progress Notes (Signed)
 Office: [phone]  /  Fax: [phone]  WEIGHT SUMMARY AND BIOMETRICS  Weight Lost Since Last Visit: 0lb  Weight Gained Since Last Visit: 0lb   Vitals Temp: 98.3 F (36.8 C) BP: 138/88 (manual) Pulse Rate: 81 SpO2: 100 %   Anthropometric Measurements Height: 5' 9 (1.753 m) Weight: 269 lb (122 kg) BMI (Calculated): 39.71 Weight at Last Visit: 269lb Weight Lost Since Last Visit: 0lb Weight Gained Since Last Visit: 0lb Starting Weight: 245lb Total Weight Loss (lbs): 0 lb (0 kg)   Body Composition  Body Fat %: 45.4 % Fat Mass (lbs): 122.4 lbs Muscle Mass (lbs): 139.6 lbs Total Body Water (lbs): 104.8 lbs Visceral Fat Rating : 12   Other Clinical Data Fasting: Yes Labs: No Today's Visit #: 51 Starting Date: 03/27/20     HPI  Chief Complaint: OBESITY  Brittany Elliott is here to discuss her progress with her obesity treatment plan. She is on the the Category 3 Plan and states she is following her eating plan approximately 10 % of the time. She states she is exercising 0 minutes 0 days per week.   Interval History:  Since last office visit she has maintained her weight.  She has been under stress at work and has been stress eating. She recently got a new job and plans to start Nov 3rd.   She has been meal prepping now for lunch.  She is aiming to eat more protein.  She is drinking water but not enough.    Pharmacotherapy for weight loss: She is not currently taking  a medication for weight loss.     Previous pharmacotherapy for medical weight loss:   She has tried [name] , Contrave , Wegovy , Qsymia , Victoza , Zepbound  and Topamax .  -She stopped Topamax  due to daytime sleepiness.  -Feels Wegovy  worked better for her by helping reduce her appetite and cravings more and she lost more weight while taking it.  -She was unable to continue Wegovy  and [name]  due to cost.  -Victoza     Bariatric surgery:  Patient has not had bariatric surgery.      Hypertension Hypertension BP is elevated today.  Medication(s): HCTZ 25mg .  Denise side effects.   Denies chest pain, palpitations and SOB.  BP Readings from Last 3 Encounters:  02/25/24 138/88  01/27/24 (!) 142/87  12/24/23 131/86   Lab Results  Component Value Date   CREATININE 0.82 11/20/2023   CREATININE 0.76 08/19/2023   CREATININE 0.82 01/14/2023     PHYSICAL EXAM:  Blood pressure 138/88, pulse 81, temperature 98.3 F (36.8 C), height 5' 9 (1.753 m), weight 269 lb (122 kg), SpO2 100%. Body mass index is 39.72 kg/m.  General: She is overweight, cooperative, alert, well developed, and in no acute distress. PSYCH: Has normal mood, affect and thought process.   Extremities: No edema.  Neurologic: No gross sensory or motor deficits. No tremors or fasciculations noted.    DIAGNOSTIC DATA REVIEWED:  BMET    Component Value Date/Time   NA 138 11/20/2023 1045   K 4.6 11/20/2023 1045   CL 105 11/20/2023 1045   CO2 21 11/20/2023 1045   GLUCOSE 81 11/20/2023 1045   GLUCOSE 95 04/21/2018 2111   BUN 9 11/20/2023 1045   CREATININE 0.82 11/20/2023 1045   CALCIUM 9.3 11/20/2023 1045   GFRNONAA 90 03/10/2020 1124   GFRAA 104 03/10/2020 1124   Lab Results  Component Value Date   HGBA1C 5.6 01/14/2023   HGBA1C 5.5 03/27/2020   Lab Results  Component  Value Date   INSULIN  18.1 01/14/2023   INSULIN  13.2 03/27/2020   Lab Results  Component Value Date   TSH 1.710 11/20/2023   CBC    Component Value Date/Time   WBC 6.6 11/20/2023 1045   WBC 8.4 11/13/2015 1601   RBC 3.94 11/20/2023 1045   RBC 4.12 11/13/2015 1601   HGB 12.0 11/20/2023 1045   HCT 37.1 11/20/2023 1045   PLT 342 11/20/2023 1045   MCV 94 11/20/2023 1045   MCH 30.5 11/20/2023 1045   MCH 29.1 11/13/2015 1601   MCHC 32.3 11/20/2023 1045   MCHC 33.0 11/13/2015 1601   RDW 12.4 11/20/2023 1045   Iron Studies    Component Value Date/Time   IRON 73 11/20/2023 1045   TIBC 291 08/07/2021 1557    FERRITIN 68 11/20/2023 1045   IRONPCTSAT 20 08/07/2021 1557   Lipid Panel     Component Value Date/Time   CHOL 145 01/14/2023 0756   TRIG 55 01/14/2023 0756   HDL 61 01/14/2023 0756   LDLCALC 72 01/14/2023 0756   Hepatic Function Panel     Component Value Date/Time   PROT 6.8 11/20/2023 1045   ALBUMIN 4.0 11/20/2023 1045   AST 9 11/20/2023 1045   ALT 30 11/20/2023 1045   ALKPHOS 54 11/20/2023 1045   BILITOT 0.6 11/20/2023 1045      Component Value Date/Time   TSH 1.710 11/20/2023 1045   Nutritional Lab Results  Component Value Date   VD25OH 43.6 08/19/2023   VD25OH 39.4 01/14/2023   VD25OH 28.8 (L) 05/08/2022     ASSESSMENT AND PLAN  TREATMENT PLAN FOR OBESITY:  Recommended Dietary Goals  Elynore is currently in the action stage of change. As such, her goal is to continue weight management plan. She has agreed to keeping a food journal and adhering to recommended goals of 1600-1700 calories and 90+ grams protein.  Behavioral Intervention  We discussed the following Behavioral Modification Strategies today: increasing lean protein intake to established goals, decreasing simple carbohydrates , increasing vegetables, increasing fiber rich foods, increasing water intake , work on meal planning and preparation, work on tracking and journaling calories using tracking application, reading food labels , keeping healthy foods at home, continue to work on maintaining a reduced calorie state, getting the recommended amount of protein, incorporating whole foods, making healthy choices, staying well hydrated and practicing mindfulness when eating., and increase protein intake, fibrous foods (25 grams per day for women, 30 grams for men) and water to improve satiety and decrease hunger signals. .  Additional resources provided today: NA  Recommended Physical Activity Goals  Jeniffer has been advised to work up to 150 minutes of moderate intensity aerobic activity a week and  strengthening exercises 2-3 times per week for cardiovascular health, weight loss maintenance and preservation of muscle mass.   She has agreed to Think about enjoyable ways to increase daily physical activity and overcoming barriers to exercise, Increase physical activity in their day and reduce sedentary time (increase NEAT)., Work on scheduling and tracking physical activity. , and Combine aerobic and strengthening exercises for efficiency and improved cardiometabolic health.   Pharmacotherapy We discussed various medication options to help Rhilynn with her weight loss efforts and we both agreed to start Wegovy  0.25 mg. Side effects discussed.  Contraindications:  Pancreatitis (active gallstones) Medullary thyroid  cancer High triglycerides (>500)-will need labs prior to starting Multiple Endocrine Neoplasia syndrome type 2 (MEN 2) Trying to get pregnant Breastfeeding Use with caution with taking  insulin  or sulfonylureas (will need to monitor blood sugars for hypoglycemia)  ASSOCIATED CONDITIONS ADDRESSED TODAY  Action/Plan  Essential hypertension -     hydroCHLOROthiazide ; Take 1 tablet (25 mg total) by mouth daily.  Dispense: 30 tablet; Refill: 0   Side effects discussed.    Vitamin D  deficiency -     Vitamin D  (Ergocalciferol ); Take 1 capsule (50,000 Units total) by mouth once a week.  Dispense: 5 capsule; Refill: 0  Class 2 severe obesity due to excess calories with serious comorbidity and body mass index (BMI) of 39.0 to 39.9 in adult -     Start Wegovy ; Inject 0.25 mg into the skin once a week.  Dispense: 2 mL; Refill: 0         Return in about 4 weeks (around 03/24/2024).SABRA She was informed of the importance of frequent follow up visits to maximize her success with intensive lifestyle modifications for her multiple health conditions.   ATTESTASTION STATEMENTS:  Reviewed by clinician on day of visit: allergies, medications, problem list, medical history, surgical  history, family history, social history, and previous encounter notes.    Corean SAUNDERS. Romaine Neville FNP-C

## 2024-02-25 NOTE — Patient Instructions (Signed)
 What is a GLP-1 Glucagon like peptide-1 (GLP-1) agonists represent a class of medications used to treat type 2 diabetes mellitus and obesity.  GLP-1 medications mimic the action of a hormone called glucagon like peptide 1.  When blood sugar levels start to rise/increase these drugs stimulate the body to produce more insulin.  When that happens, the extra insulin helps to lower the blood sugar levels in the body.  This in returns helps with decreasing cravings.  These medications also slow the movement of food from the stomach into the small intestine.  This in return helps one to full faster and longer.   Diabetic medications: Approved for treatment of diabetes mellitus but does not have full approval for weight loss use Victoza (liraglutide) Ozempic (semaglutide) Mounjaro Trulicity Rybelsus  Weight loss medications: Approved for long-term weight loss use.        [name] (liraglutide) Wegovy (semaglutide) Zepbound Contraindications:  Pancreatitis (active gallstones) Medullary thyroid cancer High triglycerides (>500)-will need labs prior to starting Multiple Endocrine Neoplasia syndrome type 2 (MEN 2) Trying to get pregnant Breastfeeding Use with caution with taking insulin or sulfonylureas (will need to monitor blood sugars for hypoglycemia) Side effects (most common): Most common side effects are nausea, gas, bloating and constipation.  Other possible side effects are headaches, belching, diarrhea, tiredness (fatigue), vomiting, upset stomach, dizziness, heartburn and stomach (abdominal pain).  If you think that you are becoming dehydrated, please inform our office or your primary family provider.  Stop immediately and go to ER if you have any symptoms of a serious allergic reaction including swelling of your face, lips, tongue or throat; problems breathing or swallowing; severe rash or itching; fainting or feeling dizzy; or very rapid heart rate.                                                                                          Steps to starting your [facility]  The office staff will send a prior authorization request to your insurance company for approval. We will send you a mychart message once we hear back from your insurance with a decision.  This can take up to 7-10 business days.   Once your WegovyTis approved, you may then pick up [facility] pen from your pharmacy.    Learn how to do Wegovy injections on the [employer] website. There is a training video that will walk you through how to safely perform the injection. If you have questions for our clinical staff, please contact our  clinical staff. If you have any symptoms of allergic reaction to [facility] discontinue immediately and call 911.  1. What should I tell my provider before using WegovyT ? have or have had problems with your pancreas or kidneys. have type 2 diabetes and a history of diabetic retinopathy. have or have had depression, suicidal thoughts, or mental health issues. are pregnant or plan to become pregnant. Joesphine Bare may harm your unborn baby. You should stop using WegovyT 3 months before you plan to become pregnant or if you are breastfeeding or plan to breastfeed. It is not known if WegovyT passes into your breast milk.  2. What is [place] and  how does it work?  Joesphine Bare is an injectable prescription medication prescribed by your provider to help with your weight loss.  This medicine will be most effective when combined with a reduced calorie diet and physical activity.  Joesphine Bare is not for the treatment of type 2 diabetes mellitus. Joesphine Bare should not be used with other GLP-1 receptor agonist medicines. The addition of WegovyT in  patients treated with insulin has not been evaluated. When initiating WegovyT, consider reducing the dose of concomitantly administered insulin secretagogues (such as sulfonylureas) or insulin to reduce the risk of  hypoglycemia.  One role of GLP-1 is to send a signal to your  brain to tell it you are full. It also slows down stomach emptying which will make you feel full longer and may help with reducing cravings.   3.  How should I take WegovyT?  Administer WegovyT once weekly, on the same day each week, at any time of day, with or without meals Inject subcutaneously in the abdomen, thigh or upper arm Initiate at 0.25 mg once weekly for 4 weeks. In 4 week intervals, increase the dose until a dose of 2.4 mg is reached (we will discuss with you the dosage at each visit). The maintenance dose of WegovyT is 2.4 mg once weekly.  The dosing schedule of Wegovy is:  0.25 mg per week X 4 weeks 0.5 mg per week X 4 weeks 1.0 mg per week X 4 weeks 1.7 mg per week X 4 weeks 2.4 mg per week   Missed dose   If you miss your injection day, go ahead inject your current dose. You can go >7 days, but not <7 days between injections. You may change your injection day (It must be >7 days). If you miss >2 doses, you can still keep next injection dose the same or follow de-escalation schedule which may minimize GI symptoms.   In patients with type 2 diabetes, monitor blood glucose prior to starting and during WEGOVYT treatment.   Inject your dose of Wegovy under the skin (subcutaneous injection) in your stomach area (abdomen), upper leg (thigh) or upper arm. Do not inject into a vein or a muscle. The injection site should be rotated and not given in the same spot each day. Hold the needle under the skin and count to "10". This will allow all of the medicine to be dispensed under the skin. Always wipe your skin with an alcohol prep pad before injection  Dispose of used pen in an approved sharps container. More practical options that can be put in the trash  to go to the landfill are milk jugs or plastic laundry detergent containers with a screw on lid.  What side effects may I notice from taking WegovyT?  Side effects that usually do not require medical attention (report to our  office if they continue or are bothersome): Nausea (most common but decreases over time in most people as their body gets used to the medicine) Diarrhea Constipation (you may take an over the counter laxative if needed) Headache Decreased appetite Upset stomach Tiredness Dizziness Feeling bloated Hair loss Belching Gas Heartburn  Side effects that you should call 911 as soon as possible Vomiting Stomach pain Fever Yellowing of your skin or eyes  Clay-colored stools Increased heart rate while at rest Low blood sugar  Sudden changes in mood, behaviors, thoughts, feelings, or thoughts of suicide If you get a lump or swelling in your neck, hoarseness, trouble swallowing, or shortness of breath. Allergic  reaction such as skin rash, itching, hives, swelling of the face, tongue, or lips  Helpful tips for managing nausea Nausea is a common side effect when first starting WegovyT. If you experience nausea, be sure to connect with your health care provider. He or she will offer guidance on ways to manage it, which may include: Eat bland, low-fat foods, like crackers, toast and rice  Eat foods that contain water, like soups and gelatin  Avoid lying down after you eat  Go outdoors for fresh air  Eat more slowly    Other important information Do not drop your pen or knock it against hard surfaces  Do not expose your pen to any liquids  If you think that your pen may be damaged, do not try to fix it. Use a new one Keep the pen cap on until you are ready to inject. Your pen will no longer be sterile if you store an unused pen without the cap, if you pull the pen cap off and put it on again, or if the pen cap is missing. This could lead to an infection  Store the [place] pen in the refrigerator from 47F to 47F (2C to 8C) If needed, before removing the pen cap, WegovyT can be stored from 8C to 30C (47F to 21F) in the original carton for up to 28 days.  Keep WegovyT in the original  carton to protect it from light  Do not freeze  Throw away pen if WegovyT has been frozen, has been exposed to light or temperatures above 21F (30C), or has been out of the refrigerator for 28 days or longer It's important to properly dispose of your used WegovyT pens. Do not throw the pen away in your household trash. Instead, use an FDA-cleared sharps disposable container or a sturdy household container with a tight-fitting lid, like a heavy duty plastic container.   ZOXWRU pen training website: [employer]astyTh[employer]  Wegovy savings and support link: achegone.com

## 2024-02-26 ENCOUNTER — Other Ambulatory Visit: Payer: Self-pay | Admitting: Nurse Practitioner

## 2024-02-26 DIAGNOSIS — J302 Other seasonal allergic rhinitis: Secondary | ICD-10-CM

## 2024-03-24 ENCOUNTER — Ambulatory Visit: Admitting: Nurse Practitioner

## 2024-03-24 ENCOUNTER — Encounter: Payer: Self-pay | Admitting: Nurse Practitioner

## 2024-03-24 VITALS — BP 136/80 | HR 80 | Temp 98.1°F | Ht 69.0 in | Wt 267.0 lb

## 2024-03-24 DIAGNOSIS — E559 Vitamin D deficiency, unspecified: Secondary | ICD-10-CM

## 2024-03-24 DIAGNOSIS — E66812 Obesity, class 2: Secondary | ICD-10-CM | POA: Diagnosis not present

## 2024-03-24 DIAGNOSIS — Z6839 Body mass index (BMI) 39.0-39.9, adult: Secondary | ICD-10-CM | POA: Diagnosis not present

## 2024-03-24 DIAGNOSIS — I10 Essential (primary) hypertension: Secondary | ICD-10-CM | POA: Diagnosis not present

## 2024-03-24 MED ORDER — WEGOVY 0.5 MG/0.5ML ~~LOC~~ SOAJ: 0.5000 mg | SUBCUTANEOUS | 0 refills | Status: DC

## 2024-03-24 MED ORDER — VITAMIN D (ERGOCALCIFEROL) 1.25 MG (50000 UNIT) PO CAPS
50000.0000 [IU] | ORAL_CAPSULE | ORAL | 0 refills | Status: DC
Start: 2024-03-24 — End: 2024-04-21

## 2024-03-24 MED ORDER — HYDROCHLOROTHIAZIDE 25 MG PO TABS
25.0000 mg | ORAL_TABLET | Freq: Every day | ORAL | 0 refills | Status: DC
Start: 2024-03-24 — End: 2024-04-21

## 2024-03-24 NOTE — Progress Notes (Signed)
 Office: [phone]  /  Fax: [phone]  WEIGHT SUMMARY AND BIOMETRICS  Weight Lost Since Last Visit: 2lb  Weight Gained Since Last Visit: 0lb   Vitals Temp: 98.1 F (36.7 C) BP: 136/80 Pulse Rate: 80 SpO2: 100 %   Anthropometric Measurements Height: 5' 9 (1.753 m) Weight: 267 lb (121.1 kg) BMI (Calculated): 39.41 Weight at Last Visit: 269lb Weight Lost Since Last Visit: 2lb Weight Gained Since Last Visit: 0lb Starting Weight: 245lb Total Weight Loss (lbs): 0 lb (0 kg)   Body Composition  Body Fat %: 44.8 % Fat Mass (lbs): 120 lbs Muscle Mass (lbs): 140.4 lbs Total Body Water (lbs): 101.4 lbs Visceral Fat Rating : 11   Other Clinical Data Fasting: Yes Labs: No Today's Visit #: 61 Starting Date: 03/27/20     HPI  Chief Complaint: OBESITY  Harika is here to discuss her progress with her obesity treatment plan. She is on the the Category 3 Plan and states she is following her eating plan approximately 40 % of the time. She states she is exercising 0 minutes 0 days per week.   Interval History:  Since last office visit she has lost 2 pounds.    BF:  2 eggs and breakfast meat Lunch:  meal prepping-sandwich with a fruit Snack:  protein bar Dinner:  protein with a vegetable Drinks:  water, tea  Pharmacotherapy for weight loss: She is currently taking Wegovy  0.25mg  for weight loss.  Denies side effects.     Previous pharmacotherapy for medical weight loss:   She has tried [name] , Contrave , Wegovy , Qsymia , Victoza , Zepbound  and Topamax .  -She stopped Topamax  due to daytime sleepiness.  -Feels Wegovy  worked better for her by helping reduce her appetite and cravings more and she lost more weight while taking it.  -She was unable to continue Wegovy  and [name]  due to cost.  -Victoza     Bariatric surgery:  Patient has not had bariatric surgery.     Vit D deficiency  She is taking Vit D 50,000 IU weekly.  Denies side effects.  Denies nausea,  vomiting or muscle weakness.    Lab Results  Component Value Date   VD25OH 43.6 08/19/2023   VD25OH 39.4 01/14/2023   VD25OH 28.8 (L) 05/08/2022    Hypertension Medication(s): HCTZ 25mg . Denies side effects.   Denies chest pain, palpitations and SOB.  BP Readings from Last 3 Encounters:  03/24/24 136/80  02/25/24 138/88  01/27/24 (!) 142/87   Lab Results  Component Value Date   CREATININE 0.82 11/20/2023   CREATININE 0.76 08/19/2023   CREATININE 0.82 01/14/2023     PHYSICAL EXAM:  Blood pressure 136/80, pulse 80, temperature 98.1 F (36.7 C), height 5' 9 (1.753 m), weight 267 lb (121.1 kg), SpO2 100%. Body mass index is 39.43 kg/m.  General: She is overweight, cooperative, alert, well developed, and in no acute distress. PSYCH: Has normal mood, affect and thought process.   Extremities: No edema.  Neurologic: No gross sensory or motor deficits. No tremors or fasciculations noted.    DIAGNOSTIC DATA REVIEWED:  BMET    Component Value Date/Time   NA 138 11/20/2023 1045   K 4.6 11/20/2023 1045   CL 105 11/20/2023 1045   CO2 21 11/20/2023 1045   GLUCOSE 81 11/20/2023 1045   GLUCOSE 95 04/21/2018 2111   BUN 9 11/20/2023 1045   CREATININE 0.82 11/20/2023 1045   CALCIUM 9.3 11/20/2023 1045   GFRNONAA 90 03/10/2020 1124   GFRAA 104 03/10/2020 1124  Lab Results  Component Value Date   HGBA1C 5.6 01/14/2023   HGBA1C 5.5 03/27/2020   Lab Results  Component Value Date   INSULIN  18.1 01/14/2023   INSULIN  13.2 03/27/2020   Lab Results  Component Value Date   TSH 1.710 11/20/2023   CBC    Component Value Date/Time   WBC 6.6 11/20/2023 1045   WBC 8.4 11/13/2015 1601   RBC 3.94 11/20/2023 1045   RBC 4.12 11/13/2015 1601   HGB 12.0 11/20/2023 1045   HCT 37.1 11/20/2023 1045   PLT 342 11/20/2023 1045   MCV 94 11/20/2023 1045   MCH 30.5 11/20/2023 1045   MCH 29.1 11/13/2015 1601   MCHC 32.3 11/20/2023 1045   MCHC 33.0 11/13/2015 1601   RDW 12.4  11/20/2023 1045   Iron Studies    Component Value Date/Time   IRON 73 11/20/2023 1045   TIBC 291 08/07/2021 1557   FERRITIN 68 11/20/2023 1045   IRONPCTSAT 20 08/07/2021 1557   Lipid Panel     Component Value Date/Time   CHOL 145 01/14/2023 0756   TRIG 55 01/14/2023 0756   HDL 61 01/14/2023 0756   LDLCALC 72 01/14/2023 0756   Hepatic Function Panel     Component Value Date/Time   PROT 6.8 11/20/2023 1045   ALBUMIN 4.0 11/20/2023 1045   AST 9 11/20/2023 1045   ALT 30 11/20/2023 1045   ALKPHOS 54 11/20/2023 1045   BILITOT 0.6 11/20/2023 1045      Component Value Date/Time   TSH 1.710 11/20/2023 1045   Nutritional Lab Results  Component Value Date   VD25OH 43.6 08/19/2023   VD25OH 39.4 01/14/2023   VD25OH 28.8 (L) 05/08/2022     ASSESSMENT AND PLAN  TREATMENT PLAN FOR OBESITY:  Recommended Dietary Goals  Brittany Elliott is currently in the action stage of change. As such, her goal is to continue weight management plan. She has agreed to the Category 3 Plan.  Behavioral Intervention  We discussed the following Behavioral Modification Strategies today: increasing lean protein intake to established goals, decreasing simple carbohydrates , increasing vegetables, increasing fiber rich foods, increasing water intake , work on meal planning and preparation, reading food labels , keeping healthy foods at home, continue to work on maintaining a reduced calorie state, getting the recommended amount of protein, incorporating whole foods, making healthy choices, staying well hydrated and practicing mindfulness when eating., and increase protein intake, fibrous foods (25 grams per day for women, 30 grams for men) and water to improve satiety and decrease hunger signals. .  Additional resources provided today: NA  Recommended Physical Activity Goals  Aviance has been advised to work up to 150 minutes of moderate intensity aerobic activity a week and strengthening exercises 2-3  times per week for cardiovascular health, weight loss maintenance and preservation of muscle mass.   She has agreed to Think about enjoyable ways to increase daily physical activity and overcoming barriers to exercise, Increase physical activity in their day and reduce sedentary time (increase NEAT)., Start strengthening exercises with a goal of 2-3 sessions a week , Start aerobic activity with a goal of 150 minutes a week at moderate intensity. , Work on scheduling and tracking physical activity. , and Combine aerobic and strengthening exercises for efficiency and improved cardiometabolic health.   Pharmacotherapy We discussed various medication options to help Suanne with her weight loss efforts and we both agreed to increase Wegovy  0.5mg .  side effects discussed.  ASSOCIATED CONDITIONS ADDRESSED TODAY  Action/Plan  Vitamin D  deficiency -     Vitamin D  (Ergocalciferol ); Take 1 capsule (50,000 Units total) by mouth once a week.  Dispense: 5 capsule; Refill: 0  Essential hypertension -     hydroCHLOROthiazide ; Take 1 tablet (25 mg total) by mouth daily.  Dispense: 90 tablet; Refill: 0  Class 2 severe obesity due to excess calories with serious comorbidity and body mass index (BMI) of 39.0 to 39.9 in adult -     Wegovy ; Inject 0.5 mg into the skin once a week.  Dispense: 2 mL; Refill: 0      Will recheck labs in December.   Bio Impedance reviewed with patient   Return in about 4 weeks (around 04/21/2024).SABRA She was informed of the importance of frequent follow up visits to maximize her success with intensive lifestyle modifications for her multiple health conditions.   ATTESTASTION STATEMENTS:  Reviewed by clinician on day of visit: allergies, medications, problem list, medical history, surgical history, family history, social history, and previous encounter notes.      Corean SAUNDERS. Andreas Sobolewski FNP-C

## 2024-04-21 ENCOUNTER — Ambulatory Visit: Admitting: Nurse Practitioner

## 2024-04-21 ENCOUNTER — Encounter: Payer: Self-pay | Admitting: Nurse Practitioner

## 2024-04-21 VITALS — BP 124/85 | HR 74 | Temp 98.1°F | Ht 69.0 in | Wt 265.0 lb

## 2024-04-21 DIAGNOSIS — E66812 Obesity, class 2: Secondary | ICD-10-CM | POA: Diagnosis not present

## 2024-04-21 DIAGNOSIS — E559 Vitamin D deficiency, unspecified: Secondary | ICD-10-CM

## 2024-04-21 DIAGNOSIS — I10 Essential (primary) hypertension: Secondary | ICD-10-CM

## 2024-04-21 DIAGNOSIS — Z6839 Body mass index (BMI) 39.0-39.9, adult: Secondary | ICD-10-CM

## 2024-04-21 MED ORDER — VITAMIN D (ERGOCALCIFEROL) 1.25 MG (50000 UNIT) PO CAPS: 50000.0000 [IU] | ORAL_CAPSULE | ORAL | 0 refills | Status: DC

## 2024-04-21 MED ORDER — WEGOVY 1 MG/0.5ML ~~LOC~~ SOAJ: 1.0000 mg | SUBCUTANEOUS | 0 refills | Status: DC

## 2024-04-21 MED ORDER — HYDROCHLOROTHIAZIDE 25 MG PO TABS: 25.0000 mg | ORAL_TABLET | Freq: Every day | ORAL | 0 refills | Status: AC

## 2024-04-21 NOTE — Progress Notes (Signed)
 Office: [phone]  /  Fax: [phone]  WEIGHT SUMMARY AND BIOMETRICS  Weight Lost Since Last Visit: 2lb  Weight Gained Since Last Visit: 0lb   Vitals Temp: 98.1 F (36.7 C) BP: 124/85 Pulse Rate: 74 SpO2: 100 %   Anthropometric Measurements Height: 5' 9 (1.753 m) Weight: 265 lb (120.2 kg) BMI (Calculated): 39.12 Weight at Last Visit: 267lb Weight Lost Since Last Visit: 2lb Weight Gained Since Last Visit: 0lb Starting Weight: 245lb Total Weight Loss (lbs): 0 lb (0 kg)   Body Composition  Body Fat %: 43.9 % Fat Mass (lbs): 116.6 lbs Muscle Mass (lbs): 141.6 lbs Total Body Water (lbs): 101 lbs Visceral Fat Rating : 11   Other Clinical Data Fasting: Yes Labs: No Today's Visit #: 32 Starting Date: 03/27/20     HPI  Chief Complaint: OBESITY  Brittany Elliott is here to discuss her progress with her obesity treatment plan. She is on the the Category 3 Plan and states she is following her eating plan approximately 40 % of the time. She states she is exercising 0 minutes 0 days per week.   Interval History:  Since last office visit she has lost 2 pounds.  She is meal prepping. She is drinking water, G2, protein shake and ginger ale.  She is averaging around 10,000-12,000 steps per day while at work.    Pharmacotherapy for weight loss: She is currently taking Wegovy  0.5mg  for weight loss.  Denies side effects.  Notes some polyphagia and cravings.     Previous pharmacotherapy for medical weight loss:   She has tried [name] , Contrave , Wegovy , Qsymia , Victoza , Zepbound  and Topamax .  -She stopped Topamax  due to daytime sleepiness.  -Feels Wegovy  worked better for her by helping reduce her appetite and cravings more and she lost more weight while taking it.  -She was unable to continue Wegovy  and [name]  due to cost.  -Victoza     Bariatric surgery:  Patient has not had bariatric surgery.     Vit D deficiency  She is taking Vit D 50,000 IU weekly.  Denies side  effects.  Denies nausea, vomiting or muscle weakness.    Lab Results  Component Value Date   VD25OH 43.6 08/19/2023   VD25OH 39.4 01/14/2023   VD25OH 28.8 (L) 05/08/2022     Hypertension Hypertension improved.  Medication(s): HCTZ 25mg .  Denies side effects.   Denies chest pain, palpitations and SOB.  BP Readings from Last 3 Encounters:  04/21/24 124/85  03/24/24 136/80  02/25/24 138/88   Lab Results  Component Value Date   CREATININE 0.82 11/20/2023   CREATININE 0.76 08/19/2023   CREATININE 0.82 01/14/2023       PHYSICAL EXAM:  Blood pressure 124/85, pulse 74, temperature 98.1 F (36.7 C), height 5' 9 (1.753 m), weight 265 lb (120.2 kg), SpO2 100%. Body mass index is 39.13 kg/m.  General: She is overweight, cooperative, alert, well developed, and in no acute distress. PSYCH: Has normal mood, affect and thought process.   Extremities: No edema.  Neurologic: No gross sensory or motor deficits. No tremors or fasciculations noted.    DIAGNOSTIC DATA REVIEWED:  BMET    Component Value Date/Time   NA 138 11/20/2023 1045   K 4.6 11/20/2023 1045   CL 105 11/20/2023 1045   CO2 21 11/20/2023 1045   GLUCOSE 81 11/20/2023 1045   GLUCOSE 95 04/21/2018 2111   BUN 9 11/20/2023 1045   CREATININE 0.82 11/20/2023 1045   CALCIUM 9.3 11/20/2023 1045   GFRNONAA  90 03/10/2020 1124   GFRAA 104 03/10/2020 1124   Lab Results  Component Value Date   HGBA1C 5.6 01/14/2023   HGBA1C 5.5 03/27/2020   Lab Results  Component Value Date   INSULIN  18.1 01/14/2023   INSULIN  13.2 03/27/2020   Lab Results  Component Value Date   TSH 1.710 11/20/2023   CBC    Component Value Date/Time   WBC 6.6 11/20/2023 1045   WBC 8.4 11/13/2015 1601   RBC 3.94 11/20/2023 1045   RBC 4.12 11/13/2015 1601   HGB 12.0 11/20/2023 1045   HCT 37.1 11/20/2023 1045   PLT 342 11/20/2023 1045   MCV 94 11/20/2023 1045   MCH 30.5 11/20/2023 1045   MCH 29.1 11/13/2015 1601   MCHC 32.3 11/20/2023  1045   MCHC 33.0 11/13/2015 1601   RDW 12.4 11/20/2023 1045   Iron Studies    Component Value Date/Time   IRON 73 11/20/2023 1045   TIBC 291 08/07/2021 1557   FERRITIN 68 11/20/2023 1045   IRONPCTSAT 20 08/07/2021 1557   Lipid Panel     Component Value Date/Time   CHOL 145 01/14/2023 0756   TRIG 55 01/14/2023 0756   HDL 61 01/14/2023 0756   LDLCALC 72 01/14/2023 0756   Hepatic Function Panel     Component Value Date/Time   PROT 6.8 11/20/2023 1045   ALBUMIN 4.0 11/20/2023 1045   AST 9 11/20/2023 1045   ALT 30 11/20/2023 1045   ALKPHOS 54 11/20/2023 1045   BILITOT 0.6 11/20/2023 1045      Component Value Date/Time   TSH 1.710 11/20/2023 1045   Nutritional Lab Results  Component Value Date   VD25OH 43.6 08/19/2023   VD25OH 39.4 01/14/2023   VD25OH 28.8 (L) 05/08/2022     ASSESSMENT AND PLAN  TREATMENT PLAN FOR OBESITY:  Recommended Dietary Goals  Brittany Elliott is currently in the action stage of change. As such, her goal is to continue weight management plan. She has agreed to the Category 3 Plan.  Behavioral Intervention  We discussed the following Behavioral Modification Strategies today: increasing lean protein intake to established goals, decreasing simple carbohydrates , increasing vegetables, increasing fiber rich foods, increasing water intake , work on meal planning and preparation, work on tracking and journaling calories using tracking application, reading food labels , keeping healthy foods at home, planning for success, celebration eating strategies, continue to work on maintaining a reduced calorie state, getting the recommended amount of protein, incorporating whole foods, making healthy choices, staying well hydrated and practicing mindfulness when eating., and increase protein intake, fibrous foods (25 grams per day for women, 30 grams for men) and water to improve satiety and decrease hunger signals. .  Additional resources provided today:  NA  Recommended Physical Activity Goals  Brittany Elliott has been advised to work up to 150 minutes of moderate intensity aerobic activity a week and strengthening exercises 2-3 times per week for cardiovascular health, weight loss maintenance and preservation of muscle mass.   She has agreed to Think about enjoyable ways to increase daily physical activity and overcoming barriers to exercise, Increase physical activity in their day and reduce sedentary time (increase NEAT)., Work on scheduling and tracking physical activity. , Increase volume of physical activity to a goal of 240 minutes a week, and Combine aerobic and strengthening exercises for efficiency and improved cardiometabolic health.   Needs to start exercising-cardio and resistance training.  We have discussed this on multiple visits.    Pharmacotherapy We discussed  various medication options to help Brittany Elliott with her weight loss efforts and we both agreed to continue Wegovy  0.5mg  x 1 more dose and then increase to Wegovy  1mg .  Side effects discussed.  .  ASSOCIATED CONDITIONS ADDRESSED TODAY  Action/Plan  Vitamin D  deficiency -     Vitamin D  (Ergocalciferol ); Take 1 capsule (50,000 Units total) by mouth once a week.  Dispense: 5 capsule; Refill: 0  Essential hypertension -     hydroCHLOROthiazide ; Take 1 tablet (25 mg total) by mouth daily.  Dispense: 90 tablet; Refill: 0  Class 2 severe obesity due to excess calories with serious comorbidity and body mass index (BMI) of 39.0 to 39.9 in adult -     Wegovy ; Inject 1 mg into the skin once a week.  Dispense: 2 mL; Refill: 0     Bio Impedance reviewed with patient.   Will recheck labs at next visit  Return in about 4 weeks (around 05/19/2024).SABRA She was informed of the importance of frequent follow up visits to maximize her success with intensive lifestyle modifications for her multiple health conditions.   ATTESTASTION STATEMENTS:  Reviewed by clinician on day of visit:  allergies, medications, problem list, medical history, surgical history, family history, social history, and previous encounter notes.     Corean SAUNDERS. Georgette Helmer FNP-C

## 2024-04-30 ENCOUNTER — Other Ambulatory Visit: Payer: Self-pay | Admitting: Nurse Practitioner

## 2024-04-30 DIAGNOSIS — J302 Other seasonal allergic rhinitis: Secondary | ICD-10-CM

## 2024-05-18 ENCOUNTER — Ambulatory Visit: Admitting: Nurse Practitioner

## 2024-05-18 ENCOUNTER — Encounter: Payer: Self-pay | Admitting: Nurse Practitioner

## 2024-05-18 ENCOUNTER — Telehealth: Payer: Self-pay

## 2024-05-18 VITALS — BP 133/86 | HR 86 | Temp 98.3°F | Ht 69.0 in | Wt 265.0 lb

## 2024-05-18 DIAGNOSIS — E66812 Obesity, class 2: Secondary | ICD-10-CM

## 2024-05-18 DIAGNOSIS — Z6839 Body mass index (BMI) 39.0-39.9, adult: Secondary | ICD-10-CM | POA: Diagnosis not present

## 2024-05-18 DIAGNOSIS — E559 Vitamin D deficiency, unspecified: Secondary | ICD-10-CM | POA: Diagnosis not present

## 2024-05-18 MED ORDER — VITAMIN D (ERGOCALCIFEROL) 1.25 MG (50000 UNIT) PO CAPS: 50000.0000 [IU] | ORAL_CAPSULE | ORAL | 0 refills | Status: DC

## 2024-05-18 MED ORDER — WEGOVY 1.7 MG/0.75ML ~~LOC~~ SOAJ: 1.7000 mg | SUBCUTANEOUS | 0 refills | Status: AC

## 2024-05-18 NOTE — Progress Notes (Signed)
 "  Office: [phone]  /  Fax: [phone]  WEIGHT SUMMARY AND BIOMETRICS  Weight Lost Since Last Visit: 0lb  Weight Gained Since Last Visit: 0lb   Vitals Temp: 98.3 F (36.8 C) BP: 133/86 Pulse Rate: 86 SpO2: 98 %   Anthropometric Measurements Height: 5' 9 (1.753 m) Weight: 265 lb (120.2 kg) BMI (Calculated): 39.12 Weight at Last Visit: 265lb Weight Lost Since Last Visit: 0lb Weight Gained Since Last Visit: 0lb Starting Weight: 245lb Total Weight Loss (lbs): 0 lb (0 kg)   Body Composition  Body Fat %: 44.6 % Fat Mass (lbs): 118.2 lbs Muscle Mass (lbs): 139.6 lbs Total Body Water (lbs): 101.6 lbs Visceral Fat Rating : 11   Other Clinical Data Fasting: Yes Labs: No Today's Visit #: 29 Starting Date: 03/27/20     HPI  Chief Complaint: OBESITY  Nikaya is here to discuss her progress with her obesity treatment plan. She is on the the Category 3 Plan and states she is following her eating plan approximately 45 % of the time. She states she is exercising 0 minutes 0 days per week.   Interval History:  Since last office visit she has maintained her weight.  She's surprised she didn't lose anything.  She feels that she is watching her portion sizes.   She is drinking water but not enough.  She occ drinks a ginger ale.  She is not exercising.   Pharmacotherapy for weight loss: She is currently taking Wegovy  1 mg for weight loss.  Denies side effects.  Polyphagia and cravings have improved with Wegovy  increased dose.     Previous pharmacotherapy for medical weight loss:   She has tried [name] , Contrave , Wegovy , Qsymia , Victoza , Zepbound  and Topamax .  -She stopped Topamax  due to daytime sleepiness.  -Feels Wegovy  worked better for her by helping reduce her appetite and cravings more and she lost more weight while taking it.  -She was unable to continue Wegovy  and [name]  due to cost.  -Victoza     Bariatric surgery:  Patient has not had bariatric surgery.       PHYSICAL EXAM:  Blood pressure 133/86, pulse 86, temperature 98.3 F (36.8 C), height 5' 9 (1.753 m), weight 265 lb (120.2 kg), SpO2 98%. Body mass index is 39.13 kg/m.  General: She is overweight, cooperative, alert, well developed, and in no acute distress. PSYCH: Has normal mood, affect and thought process.   Extremities: No edema.  Neurologic: No gross sensory or motor deficits. No tremors or fasciculations noted.    DIAGNOSTIC DATA REVIEWED:  BMET    Component Value Date/Time   NA 138 11/20/2023 1045   K 4.6 11/20/2023 1045   CL 105 11/20/2023 1045   CO2 21 11/20/2023 1045   GLUCOSE 81 11/20/2023 1045   GLUCOSE 95 04/21/2018 2111   BUN 9 11/20/2023 1045   CREATININE 0.82 11/20/2023 1045   CALCIUM 9.3 11/20/2023 1045   GFRNONAA 90 03/10/2020 1124   GFRAA 104 03/10/2020 1124   Lab Results  Component Value Date   HGBA1C 5.6 01/14/2023   HGBA1C 5.5 03/27/2020   Lab Results  Component Value Date   INSULIN  18.1 01/14/2023   INSULIN  13.2 03/27/2020   Lab Results  Component Value Date   TSH 1.710 11/20/2023   CBC    Component Value Date/Time   WBC 6.6 11/20/2023 1045   WBC 8.4 11/13/2015 1601   RBC 3.94 11/20/2023 1045   RBC 4.12 11/13/2015 1601   HGB 12.0 11/20/2023 1045  HCT 37.1 11/20/2023 1045   PLT 342 11/20/2023 1045   MCV 94 11/20/2023 1045   MCH 30.5 11/20/2023 1045   MCH 29.1 11/13/2015 1601   MCHC 32.3 11/20/2023 1045   MCHC 33.0 11/13/2015 1601   RDW 12.4 11/20/2023 1045   Iron Studies    Component Value Date/Time   IRON 73 11/20/2023 1045   TIBC 291 08/07/2021 1557   FERRITIN 68 11/20/2023 1045   IRONPCTSAT 20 08/07/2021 1557   Lipid Panel     Component Value Date/Time   CHOL 145 01/14/2023 0756   TRIG 55 01/14/2023 0756   HDL 61 01/14/2023 0756   LDLCALC 72 01/14/2023 0756   Hepatic Function Panel     Component Value Date/Time   PROT 6.8 11/20/2023 1045   ALBUMIN 4.0 11/20/2023 1045   AST 9 11/20/2023 1045   ALT 30  11/20/2023 1045   ALKPHOS 54 11/20/2023 1045   BILITOT 0.6 11/20/2023 1045      Component Value Date/Time   TSH 1.710 11/20/2023 1045   Nutritional Lab Results  Component Value Date   VD25OH 43.6 08/19/2023   VD25OH 39.4 01/14/2023   VD25OH 28.8 (L) 05/08/2022     ASSESSMENT AND PLAN  TREATMENT PLAN FOR OBESITY:  Recommended Dietary Goals  Aseel is currently in the action stage of change. As such, her goal is to continue weight management plan. She has agreed to keeping a food journal and adhering to recommended goals of 1500-1600 calories and 90+ grams of protein.  I've asked her to track and will refer to RD  Behavioral Intervention  We discussed the following Behavioral Modification Strategies today: increasing lean protein intake to established goals, decreasing simple carbohydrates , increasing vegetables, increasing fiber rich foods, increasing water intake , work on meal planning and preparation, work on tracking and journaling calories using tracking application, reading food labels , keeping healthy foods at home, celebration eating strategies, continue to work on maintaining a reduced calorie state, getting the recommended amount of protein, incorporating whole foods, making healthy choices, staying well hydrated and practicing mindfulness when eating., and increase protein intake, fibrous foods (25 grams per day for women, 30 grams for men) and water to improve satiety and decrease hunger signals. .  Additional resources provided today: NA  Recommended Physical Activity Goals  Desi has been advised to work up to 150 minutes of moderate intensity aerobic activity a week and strengthening exercises 2-3 times per week for cardiovascular health, weight loss maintenance and preservation of muscle mass.   She has agreed to Think about enjoyable ways to increase daily physical activity and overcoming barriers to exercise, Increase physical activity in their day and  reduce sedentary time (increase NEAT)., Work on scheduling and tracking physical activity. , and Combine aerobic and strengthening exercises for efficiency and improved cardiometabolic health.   Pharmacotherapy We discussed various medication options to help Roberta with her weight loss efforts and we both agreed to increase Wegovy  1.7mg .  side effects discussed.    ASSOCIATED CONDITIONS ADDRESSED TODAY  Action/Plan  Vitamin D  deficiency -     Vitamin D  (Ergocalciferol ); Take 1 capsule (50,000 Units total) by mouth once a week.  Dispense: 5 capsule; Refill: 0  Class 2 severe obesity due to excess calories with serious comorbidity and body mass index (BMI) of 39.0 to 39.9 in adult -     Wegovy ; Inject 1.7 mg into the skin once a week.  Dispense: 3 mL; Refill: 0 -  Amb ref to Medical Nutrition Therapy-[facility]      Refer to [place] RD    Return in about 4 weeks (around 06/15/2024).SABRA She was informed of the importance of frequent follow up visits to maximize her success with intensive lifestyle modifications for her multiple health conditions.   ATTESTASTION STATEMENTS:  Reviewed by clinician on day of visit: allergies, medications, problem list, medical history, surgical history, family history, social history, and previous encounter notes.     Corean SAUNDERS. Laddie Naeem FNP-C "

## 2024-05-18 NOTE — Telephone Encounter (Signed)
 PA submitted through Cover My Meds for Wegovy . Awaiting insurance determination. Key: [id number]

## 2024-05-18 NOTE — Telephone Encounter (Signed)
 Received approval through Cover My Meds for Wegovy :  Message from plan: PA Case: [id number], Status: Approved, Coverage Starts on: 05/18/2024 12:00:00 AM, Coverage Ends on: 11/30/2024 12:00:00 AM.. Authorization Expiration Date: November 30, 2024.

## 2024-06-14 ENCOUNTER — Ambulatory Visit: Admitting: Nurse Practitioner

## 2024-06-17 ENCOUNTER — Ambulatory Visit: Admitting: Nurse Practitioner

## 2024-06-17 ENCOUNTER — Encounter: Payer: Self-pay | Admitting: Nurse Practitioner

## 2024-06-17 VITALS — BP 135/87 | HR 86 | Temp 98.7°F | Ht 69.0 in | Wt 262.0 lb

## 2024-06-17 DIAGNOSIS — R000 Tachycardia, unspecified: Secondary | ICD-10-CM | POA: Diagnosis not present

## 2024-06-17 DIAGNOSIS — E66812 Obesity, class 2: Secondary | ICD-10-CM | POA: Diagnosis not present

## 2024-06-17 DIAGNOSIS — Z6838 Body mass index (BMI) 38.0-38.9, adult: Secondary | ICD-10-CM

## 2024-06-17 DIAGNOSIS — E559 Vitamin D deficiency, unspecified: Secondary | ICD-10-CM | POA: Diagnosis not present

## 2024-06-17 MED ORDER — VITAMIN D (ERGOCALCIFEROL) 1.25 MG (50000 UNIT) PO CAPS: 50000.0000 [IU] | ORAL_CAPSULE | ORAL | 0 refills | Status: AC

## 2024-06-17 MED ORDER — WEGOVY 2.4 MG/0.75ML ~~LOC~~ SOAJ: 2.4000 mg | SUBCUTANEOUS | 0 refills | Status: AC

## 2024-06-17 NOTE — Progress Notes (Signed)
 "  Office: [phone]  /  Fax: [phone]  WEIGHT SUMMARY AND BIOMETRICS  Weight Lost Since Last Visit: 3lb  Weight Gained Since Last Visit: 0lb   Vitals Temp: 98.7 F (37.1 C) BP: 135/87 Pulse Rate: 86 SpO2: 100 %   Anthropometric Measurements Height: 5' 9 (1.753 m) Weight: 262 lb (118.8 kg) BMI (Calculated): 38.67 Weight at Last Visit: 265lb Weight Lost Since Last Visit: 3lb Weight Gained Since Last Visit: 0lb Starting Weight: 245lb Total Weight Loss (lbs): 0 lb (0 kg)   Body Composition  Body Fat %: 44.5 % Fat Mass (lbs): 116.6 lbs Muscle Mass (lbs): 138 lbs Total Body Water (lbs): 101.8 lbs Visceral Fat Rating : 11   Other Clinical Data Fasting: Yes Labs: No Today's Visit #: 55 Starting Date: 03/27/20     HPI  Chief Complaint: OBESITY  Brittany Elliott is here to discuss her progress with her obesity treatment plan. She is on the the Category 3 Plan and states she is following her eating plan approximately 45 % of the time. She states she is exercising 0 minutes 0 days per week.   Interval History:  Since last office visit she has lost 3 pounds.  She is drinking water daily, occ ginger ale.  She is walking at work to stay active.    Pharmacotherapy for weight loss: She is currently taking Wegovy  1.7 mg for weight loss.  Denies side effects.  Polyphagia and cravings have continued to improve with Wegovy  increased dose.    Approved through 11/30/24   Previous pharmacotherapy for medical weight loss:   She has tried [name] , Contrave , Wegovy , Qsymia , Victoza , Zepbound  and Topamax .  -She stopped Topamax  due to daytime sleepiness.  -Feels Wegovy  worked better for her by helping reduce her appetite and cravings more and she lost more weight while taking it.  -She was unable to continue Wegovy  and [name]  due to cost.  -Victoza     Bariatric surgery:  Patient has not had bariatric surgery.  Vit D deficiency  She is taking Vit D 50,000 IU weekly.  Denies  side effects.  Denies nausea, vomiting or muscle weakness.    Lab Results  Component Value Date   VD25OH 43.6 08/19/2023   VD25OH 39.4 01/14/2023   VD25OH 28.8 (L) 05/08/2022     Tachycardia Patient recently purchased an oura ring.  She notes when she is at work her heart rate runs over 100 up to 168 daily. She notes she is active at work walking and climbing steps.  She wasn't aware that her heart rate was elevated until she started wearing the oura ring.  Denies chest pain, SHOB.  Doesn't feel any changes in her heart.  Mother had CHF.      PHYSICAL EXAM:  Blood pressure 135/87, pulse 86, temperature 98.7 F (37.1 C), height 5' 9 (1.753 m), weight 262 lb (118.8 kg), SpO2 100%. Body mass index is 38.69 kg/m.  General: She is overweight, cooperative, alert, well developed, and in no acute distress. PSYCH: Has normal mood, affect and thought process.   Extremities: No edema.  Neurologic: No gross sensory or motor deficits. No tremors or fasciculations noted.    DIAGNOSTIC DATA REVIEWED:  BMET    Component Value Date/Time   NA 138 11/20/2023 1045   K 4.6 11/20/2023 1045   CL 105 11/20/2023 1045   CO2 21 11/20/2023 1045   GLUCOSE 81 11/20/2023 1045   GLUCOSE 95 04/21/2018 2111   BUN 9 11/20/2023 1045   CREATININE 0.82  11/20/2023 1045   CALCIUM 9.3 11/20/2023 1045   GFRNONAA 90 03/10/2020 1124   GFRAA 104 03/10/2020 1124   Lab Results  Component Value Date   HGBA1C 5.6 01/14/2023   HGBA1C 5.5 03/27/2020   Lab Results  Component Value Date   INSULIN  18.1 01/14/2023   INSULIN  13.2 03/27/2020   Lab Results  Component Value Date   TSH 1.710 11/20/2023   CBC    Component Value Date/Time   WBC 6.6 11/20/2023 1045   WBC 8.4 11/13/2015 1601   RBC 3.94 11/20/2023 1045   RBC 4.12 11/13/2015 1601   HGB 12.0 11/20/2023 1045   HCT 37.1 11/20/2023 1045   PLT 342 11/20/2023 1045   MCV 94 11/20/2023 1045   MCH 30.5 11/20/2023 1045   MCH 29.1 11/13/2015 1601   MCHC  32.3 11/20/2023 1045   MCHC 33.0 11/13/2015 1601   RDW 12.4 11/20/2023 1045   Iron Studies    Component Value Date/Time   IRON 73 11/20/2023 1045   TIBC 291 08/07/2021 1557   FERRITIN 68 11/20/2023 1045   IRONPCTSAT 20 08/07/2021 1557   Lipid Panel     Component Value Date/Time   CHOL 145 01/14/2023 0756   TRIG 55 01/14/2023 0756   HDL 61 01/14/2023 0756   LDLCALC 72 01/14/2023 0756   Hepatic Function Panel     Component Value Date/Time   PROT 6.8 11/20/2023 1045   ALBUMIN 4.0 11/20/2023 1045   AST 9 11/20/2023 1045   ALT 30 11/20/2023 1045   ALKPHOS 54 11/20/2023 1045   BILITOT 0.6 11/20/2023 1045      Component Value Date/Time   TSH 1.710 11/20/2023 1045   Nutritional Lab Results  Component Value Date   VD25OH 43.6 08/19/2023   VD25OH 39.4 01/14/2023   VD25OH 28.8 (L) 05/08/2022     ASSESSMENT AND PLAN  TREATMENT PLAN FOR OBESITY:  Recommended Dietary Goals  Brittany Elliott is currently in the action stage of change. As such, her goal is to continue weight management plan. She has agreed to practicing portion control and making smarter food choices, such as increasing vegetables and decreasing simple carbohydrates.  Behavioral Intervention  We discussed the following Behavioral Modification Strategies today: increasing lean protein intake to established goals, decreasing simple carbohydrates , increasing vegetables, increasing fiber rich foods, increasing water intake , work on meal planning and preparation, reading food labels , keeping healthy foods at home, planning for success, continue to work on maintaining a reduced calorie state, getting the recommended amount of protein, incorporating whole foods, making healthy choices, staying well hydrated and practicing mindfulness when eating., and increase protein intake, fibrous foods (25 grams per day for women, 30 grams for men) and water to improve satiety and decrease hunger signals. .  Additional resources  provided today: NA  Recommended Physical Activity Goals  Brittany Elliott has been advised to work up to 150 minutes of moderate intensity aerobic activity a week and strengthening exercises 2-3 times per week for cardiovascular health, weight loss maintenance and preservation of muscle mass.   She has agreed to Think about enjoyable ways to increase daily physical activity and overcoming barriers to exercise, Increase physical activity in their day and reduce sedentary time (increase NEAT)., Start strengthening exercises with a goal of 2-3 sessions a week , Start aerobic activity with a goal of 150 minutes a week at moderate intensity. , Work on scheduling and tracking physical activity. , and Combine aerobic and strengthening exercises for efficiency and improved  cardiometabolic health.   Discussed the importance of cardio and resistance training  Pharmacotherapy We discussed various medication options to help Akelia with her weight loss efforts and we both agreed to continue 1.7mg  x 1 more dose and then will increase to 2.4mg .  side effects discussed.  ASSOCIATED CONDITIONS ADDRESSED TODAY  Action/Plan  Vitamin D  deficiency -     Vitamin D  (Ergocalciferol ); Take 1 capsule (50,000 Units total) by mouth once a week.  Dispense: 5 capsule; Refill: 0  Tachycardia Patient is looking for a new PCP.  We have family practice adjoined to our practice.  I walked the patient over to family practice and helped her schedule an appt for further evaluation.   Class 2 obesity due to excess calories with body mass index (BMI) of 38.0 to 38.9 in adult, unspecified whether serious comorbidity present -     Wegovy ; Inject 2.4 mg into the skin once a week.  Dispense: 3 mL; Refill: 0       Will obtain labs in the next 1-2 months   Return in about 4 weeks (around 07/15/2024).SABRA She was informed of the importance of frequent follow up visits to maximize her success with intensive lifestyle modifications for her  multiple health conditions.   ATTESTASTION STATEMENTS:  Reviewed by clinician on day of visit: allergies, medications, problem list, medical history, surgical history, family history, social history, and previous encounter notes.     Corean SAUNDERS. Jerzi Tigert FNP-C "

## 2024-07-14 ENCOUNTER — Ambulatory Visit: Admitting: Nurse Practitioner

## 2024-07-15 ENCOUNTER — Ambulatory Visit: Admitting: Nurse Practitioner

## 2024-07-22 ENCOUNTER — Ambulatory Visit: Admitting: Family Medicine

## 2024-08-02 ENCOUNTER — Ambulatory Visit: Admitting: Nurse Practitioner

## 2024-08-03 ENCOUNTER — Ambulatory Visit: Admitting: Nurse Practitioner

## 2024-09-07 ENCOUNTER — Ambulatory Visit: Admitting: Family Medicine
